# Patient Record
Sex: Male | Born: 1935 | Race: White | Hispanic: No | Marital: Married | Smoking: Former smoker
Health system: Southern US, Community
[De-identification: ages and names within clinical notes are randomized; demographics above are authoritative.]

## PROBLEM LIST (undated history)

## (undated) DIAGNOSIS — E119 Type 2 diabetes mellitus without complications: Secondary | ICD-10-CM

## (undated) DIAGNOSIS — N400 Enlarged prostate without lower urinary tract symptoms: Secondary | ICD-10-CM

## (undated) DIAGNOSIS — I10 Essential (primary) hypertension: Secondary | ICD-10-CM

## (undated) DIAGNOSIS — Z9289 Personal history of other medical treatment: Secondary | ICD-10-CM

## (undated) DIAGNOSIS — C439 Malignant melanoma of skin, unspecified: Secondary | ICD-10-CM

## (undated) DIAGNOSIS — C4491 Basal cell carcinoma of skin, unspecified: Secondary | ICD-10-CM

## (undated) DIAGNOSIS — I480 Paroxysmal atrial fibrillation: Secondary | ICD-10-CM

## (undated) DIAGNOSIS — C61 Malignant neoplasm of prostate: Secondary | ICD-10-CM

## (undated) DIAGNOSIS — E1140 Type 2 diabetes mellitus with diabetic neuropathy, unspecified: Secondary | ICD-10-CM

## (undated) DIAGNOSIS — G629 Polyneuropathy, unspecified: Secondary | ICD-10-CM

## (undated) DIAGNOSIS — I2510 Atherosclerotic heart disease of native coronary artery without angina pectoris: Secondary | ICD-10-CM

## (undated) DIAGNOSIS — IMO0002 Reserved for concepts with insufficient information to code with codable children: Secondary | ICD-10-CM

## (undated) DIAGNOSIS — J189 Pneumonia, unspecified organism: Secondary | ICD-10-CM

## (undated) DIAGNOSIS — I219 Acute myocardial infarction, unspecified: Secondary | ICD-10-CM

## (undated) DIAGNOSIS — Z95810 Presence of automatic (implantable) cardiac defibrillator: Secondary | ICD-10-CM

## (undated) DIAGNOSIS — D649 Anemia, unspecified: Secondary | ICD-10-CM

## (undated) DIAGNOSIS — E785 Hyperlipidemia, unspecified: Secondary | ICD-10-CM

## (undated) DIAGNOSIS — Z992 Dependence on renal dialysis: Secondary | ICD-10-CM

## (undated) DIAGNOSIS — J449 Chronic obstructive pulmonary disease, unspecified: Secondary | ICD-10-CM

## (undated) DIAGNOSIS — G2581 Restless legs syndrome: Secondary | ICD-10-CM

## (undated) DIAGNOSIS — I739 Peripheral vascular disease, unspecified: Secondary | ICD-10-CM

## (undated) DIAGNOSIS — N186 End stage renal disease: Secondary | ICD-10-CM

## (undated) DIAGNOSIS — L0390 Cellulitis, unspecified: Secondary | ICD-10-CM

## (undated) HISTORY — DX: Malignant melanoma of skin, unspecified: C43.9

## (undated) HISTORY — DX: Essential (primary) hypertension: I10

## (undated) HISTORY — PX: TONSILLECTOMY: SUR1361

## (undated) HISTORY — PX: CARDIAC DEFIBRILLATOR PLACEMENT: SHX171

## (undated) HISTORY — DX: Atherosclerotic heart disease of native coronary artery without angina pectoris: I25.10

## (undated) HISTORY — DX: Reserved for concepts with insufficient information to code with codable children: IMO0002

## (undated) HISTORY — DX: Anemia, unspecified: D64.9

## (undated) HISTORY — PX: AV FISTULA PLACEMENT: SHX1204

## (undated) HISTORY — PX: CATARACT EXTRACTION W/ INTRAOCULAR LENS  IMPLANT, BILATERAL: SHX1307

## (undated) HISTORY — DX: Acute myocardial infarction, unspecified: I21.9

## (undated) HISTORY — DX: Restless legs syndrome: G25.81

## (undated) HISTORY — DX: Benign prostatic hyperplasia without lower urinary tract symptoms: N40.0

## (undated) HISTORY — DX: Hyperlipidemia, unspecified: E78.5

## (undated) HISTORY — PX: CORONARY ANGIOPLASTY WITH STENT PLACEMENT: SHX49

## (undated) HISTORY — PX: APPENDECTOMY: SHX54

## (undated) HISTORY — DX: Polyneuropathy, unspecified: G62.9

## (undated) HISTORY — DX: Malignant neoplasm of prostate: C61

---

## 1989-12-06 HISTORY — PX: MELANOMA EXCISION: SHX5266

## 1989-12-06 HISTORY — PX: CORONARY ARTERY BYPASS GRAFT: SHX141

## 1990-12-06 DIAGNOSIS — C439 Malignant melanoma of skin, unspecified: Secondary | ICD-10-CM

## 1990-12-06 DIAGNOSIS — C4491 Basal cell carcinoma of skin, unspecified: Secondary | ICD-10-CM

## 1990-12-06 HISTORY — PX: SKIN CANCER EXCISION: SHX779

## 1990-12-06 HISTORY — DX: Basal cell carcinoma of skin, unspecified: C44.91

## 1990-12-06 HISTORY — DX: Malignant melanoma of skin, unspecified: C43.9

## 2004-12-06 HISTORY — PX: POSTERIOR LUMBAR FUSION: SHX6036

## 2010-11-05 HISTORY — PX: DIALYSIS FISTULA CREATION: SHX611

## 2011-02-26 ENCOUNTER — Ambulatory Visit (INDEPENDENT_AMBULATORY_CARE_PROVIDER_SITE_OTHER): Payer: Medicare Other | Admitting: Vascular Surgery

## 2011-02-26 ENCOUNTER — Encounter (INDEPENDENT_AMBULATORY_CARE_PROVIDER_SITE_OTHER): Payer: Medicare Other

## 2011-02-26 DIAGNOSIS — T82598A Other mechanical complication of other cardiac and vascular devices and implants, initial encounter: Secondary | ICD-10-CM

## 2011-02-26 DIAGNOSIS — N186 End stage renal disease: Secondary | ICD-10-CM
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
## 2011-03-06 NOTE — Procedures (Unsigned)
VASCULAR LAB EXAM  INDICATION:  Nonmaturing left upper extremity fistula.   HISTORY:  EXAM:  Duplex of left AVF shows 3 areas of narrowing and elevated velocities.  The proximal graft is narrowed due to anatomy and failure of vein to dilate, the mid portion of the graft has elevated velocities due to a residual valve cusp, and the distal segment has elevated velocities due to suspected hyperplasia of a residual valve.  IMPRESSION:  Patent left arteriovenous fistula with areas of elevated velocities and narrowing observed.  ___________________________________________ Fransisco Hertz, MD  LT/MEDQ  D:  02/26/2011  T:  02/26/2011  Job:  [id number]

## 2011-03-17 ENCOUNTER — Encounter: Payer: Self-pay | Admitting: Vascular Surgery

## 2011-04-06 HISTORY — PX: AV FISTULA REPAIR: SHX563

## 2011-04-09 ENCOUNTER — Ambulatory Visit (INDEPENDENT_AMBULATORY_CARE_PROVIDER_SITE_OTHER): Payer: Medicare Other | Admitting: Vascular Surgery

## 2011-04-09 ENCOUNTER — Encounter (INDEPENDENT_AMBULATORY_CARE_PROVIDER_SITE_OTHER): Payer: Medicare Other

## 2011-04-09 DIAGNOSIS — N186 End stage renal disease: Secondary | ICD-10-CM

## 2011-04-09 DIAGNOSIS — N184 Chronic kidney disease, stage 4 (severe): Secondary | ICD-10-CM

## 2011-04-09 DIAGNOSIS — Z01810 Encounter for preprocedural cardiovascular examination: Secondary | ICD-10-CM

## 2011-04-12 NOTE — Assessment & Plan Note (Signed)
OFFICE VISIT  [name], [name] DOB:  07/24/1934                                       04/09/2011 ZOXWR#:[id number]  HISTORY OF PRESENT ILLNESS:  The patient is 77 year old patient with end- stage renal disease by report, now chronic kidney disease stage V with need for dialysis soon.  He was supposed to follow up with me with vein mapping which was just done today.  He at this point is not on dialysis. No changes have been noted.  No  increased swelling in the left arm.  He is still able to do activities of daily living on both sides.  PHYSICAL EXAMINATION:  On focused physical examination, he had a blood pressure 174/112, heart rate of 58, respirations 12, sating 96% on room air.  On focused exam, the left hand had 5/5 hand grip.  Sensation was grossly intact on light touch, though he does have a known history of some neurologic loss in both sides.  There was easily palpable thrill in the upper arm.  I looked at it under SonoSite, and it appears compared to the previous vein mapping to be somewhat improved from previous.  I identified at least one area of stenosis near the anastomosis.  He also had a right arm upper extremity vein mapping which demonstrates an acceptable upper arm cephalic vein and basilic vein in the upper arm for use for fistula placement.  MEDICAL DECISION MAKING:  This is a 77 year old patient with chronic kidney disease stage V.  By report it sounds like he will need dialysis soon.  I am going to subsequently go ahead and schedule him for a fistulogram of his left arm and possible venoplasty.  While I think ultimately this left brachiocephalic arteriovenous fistula is fundamentally compromised, I think that the fistulogram will 1) identify the areas of stenosis in his vein and allow possible intervention on these two stenoses and 2) it will interrogate the subclavian vein outflow, the plan basically being to use this left  brachiocephalic arteriovenous fistula as a bridge to a new fistula on the right side.  I have tentatively scheduled this for Thursday.  Pending the outcome of those findings, I will then schedule the right arm fistula, but I think first things first,  we will intervene on the left side.    [name] Hertz, MD Electronically Signed  BLC/MEDQ  D:  04/09/2011  T:  04/12/2011  Job:  [id number]

## 2011-04-15 ENCOUNTER — Ambulatory Visit (HOSPITAL_COMMUNITY)
Admission: RE | Admit: 2011-04-15 | Discharge: 2011-04-15 | Disposition: A | Discharge status: Home / Self Care | Payer: Medicare Other | Source: Ambulatory Visit | Attending: Vascular Surgery | Admitting: Vascular Surgery

## 2011-04-15 DIAGNOSIS — T82898A Other specified complication of vascular prosthetic devices, implants and grafts, initial encounter: Secondary | ICD-10-CM | POA: Insufficient documentation

## 2011-04-15 DIAGNOSIS — Z01810 Encounter for preprocedural cardiovascular examination: Secondary | ICD-10-CM | POA: Insufficient documentation

## 2011-04-15 DIAGNOSIS — N185 Chronic kidney disease, stage 5: Secondary | ICD-10-CM | POA: Insufficient documentation

## 2011-04-15 DIAGNOSIS — I120 Hypertensive chronic kidney disease with stage 5 chronic kidney disease or end stage renal disease: Secondary | ICD-10-CM

## 2011-04-15 DIAGNOSIS — N186 End stage renal disease: Secondary | ICD-10-CM

## 2011-04-15 DIAGNOSIS — Y832 Surgical operation with anastomosis, bypass or graft as the cause of abnormal reaction of the patient, or of later complication, without mention of misadventure at the time of the procedure: Secondary | ICD-10-CM | POA: Insufficient documentation

## 2011-04-15 LAB — POCT I-STAT, CHEM 8
BUN: 57 mg/dL — ABNORMAL HIGH (ref 6–23)
Calcium, Ion: 1.07 mmol/L — ABNORMAL LOW (ref 1.12–1.32)
Chloride: 114 mEq/L — ABNORMAL HIGH (ref 96–112)
Creatinine, Ser: 5.5 mg/dL — ABNORMAL HIGH (ref 0.4–1.5)
Glucose, Bld: 121 mg/dL — ABNORMAL HIGH (ref 70–99)
HCT: 28 % — ABNORMAL LOW (ref 39.0–52.0)
Hemoglobin: 9.5 g/dL — ABNORMAL LOW (ref 13.0–17.0)
Potassium: 4.3 mEq/L (ref 3.5–5.1)
Sodium: 140 mEq/L (ref 135–145)
TCO2: 18 mmol/L (ref 0–100)

## 2011-04-15 LAB — GLUCOSE, CAPILLARY
Glucose-Capillary: 128 mg/dL — ABNORMAL HIGH (ref 70–99)
Glucose-Capillary: 90 mg/dL (ref 70–99)

## 2011-04-16 ENCOUNTER — Encounter: Payer: Self-pay | Admitting: Vascular Surgery

## 2011-04-16 NOTE — Procedures (Unsigned)
CEPHALIC VEIN MAPPING  INDICATION:  Chronic kidney disease.  HISTORY: Left brachiocephalic and AICD.  EXAM: The right cephalic vein is compressible with diameter measurements ranging from 0.53 to 0.19 cm.  The right basilic vein is compressible with diameter measurements ranging from 0.70 to 0.39 cm.  See attached worksheet for all measurements.  IMPRESSION:  Patent right cephalic and basilic vein with diameter measurements as described above.  ___________________________________________ Fransisco Hertz, MD  OD/MEDQ  D:  04/09/2011  T:  04/09/2011  Job:  [id number]

## 2011-04-27 NOTE — Op Note (Signed)
NAMEDIVIT, STIPP              ACCOUNT NO.:  [id number]  MEDICAL RECORD NO.:  [id number]           PATIENT TYPE:  O  LOCATION:  [facility]                         FACILITY:  [facility]  PHYSICIAN:  Fransisco Hertz, MD       DATE OF BIRTH:  09/28/1934  DATE OF PROCEDURE:  04/15/2011 DATE OF DISCHARGE:  04/15/2011                              OPERATIVE REPORT   PROCEDURES: 1. Left brachiocephalic arteriovenous fistula cannulation under     ultrasound guidance. 2. Left arm fistulogram. 3. Left cephalic venoplasty x2, 4 mm x 20 mm and a 60 mm x 30 mm.  PREOPERATIVE DIAGNOSIS:  Left brachiocephalic arteriovenous fistula malfunction.  POSTOPERATIVE DIAGNOSIS:  Left brachiocephalic arteriovenous fistula malfunction.  SURGEON:  Fransisco Hertz, MD  ANESTHESIA:  Local.  ESTIMATED BLOOD LOSS:  Minimal.  CONTRAST:  35 mL.  SPECIMENS:  None.  FINDINGS: 1. A patent left brachiocephalic fistula with stenoses near the     arterial anastomosis, most distal greater than 90%.  The more     proximal one was consistent with sclerotic valve. 2. There is a patent left axillary and subclavian vein and a patent     left innominate vein and left internal jugular vein. 3. There are pacer wires present in the left subclavian vein.  The     left subclavian vein appears to be widely patent still. 4. Resolution of the cephalic vein stenoses after the venoplasty x2.  INDICATIONS:  This is a 77 year old gentleman who is status post a brachiocephalic arteriovenous fistula placed at an outside facility. Unfortunately, they placed it on the same side he had pacer wires partially because the patient was resistant to any intervention, any access placement in his right arm.  In order to facilitate the patient's wishes to avoid access in his right arm, I agreed to go ahead and proceed forward with fistulogram to see if we could salvage this left brachiocephalic arteriovenous fistula though in my opinion eventually  this fistula will be doomed to failure due to the presence of pacer wires in the left subclavian vein.  The patient is aware of the risks of this procedure that include bleeding, infection, possible renal failure due to the contrast dye, possible rupture of any angioplastied vessels, and possible need for additional procedure.  She is aware of these risks and agrees to proceed forward with such.  DESCRIPTION OF THE PROCEDURE:  After full informed written consent was obtained from the patient, he was brought back to the angio suite and placed supine upon the angio table.  He was connected to monitoring equipment and then he was prepped and draped in a standard fashion where a left arm fistulogram under ultrasonic guidance identified the arterial anastomosis at this left brachiocephalic arteriovenous fistula.  I cannulated the fistula near its arterial anastomosis with a micropuncture needle and passed a microwire up into the fistula and then I over the wire placed a micro sheath.  The wire was then removed and the sheath was connected to IV tubing.  The sheath was secured in place with OpSite.  At this point, we performed hand injections  which completed the fistulogram, the findings of which are listed above.  Based on these findings as there was no central venous stenosis, it was my opinion worth attempting to treat these cephalic vein stenoses.  At this point, I took off the OpSite and then I placed a Bentson wire through this sheath and advanced it to the level of the subclavian vein.  The sheath was then exchanged for a short 6-French sheath which was secured in place with OpSite.  I then placed a 4-mm x 20-mm Powerflex balloon at the location of the most distal stenoses.  This was inflated to 15 atmospheres at 1-minute inflation time with no waist whatsoever.  I then removed this and then obtained a 6-mm x 30-mm Powerflex balloon.  This was also inflated at the position of the most  distal stenosis.  This at this point did demonstrate a limited amount of waist which resolved completely with full inflation up to 12 atmospheres at a minute.  I then dropped the balloon and advanced into the level of the what appeared to be a sclerotic valve and inflated this to 10 atmospheres at 30 seconds and then I removed deflated balloon and removed at completion.  Hand injections demonstrated complete resolution of these two stenoses.  At this point, there was no residual stenoses in the venous outflow that I felt would compromise the function of this fistula, so at this point the case was terminated.  I removed the wire and then I took off the OpSite on this securing this sheath.  I placed a 4-0 Monocryl pursestring stitch around this sheath and then while tying down the pursestring stitch, I had the sheath removed.  There was no bleeding from this puncture site.  Sterile dressing was applied at this location.  COMPLICATIONS:  None.  CONDITION:  Stable.  My plan at this point is given the lack of central venous stenosis, I think it is worth trying to use this fistula as his primary access rather than going ahead and proceeding with immediate right arm access placement.  This is also in line with the patient's wishes.     Fransisco Hertz, MD     BLC/MEDQ  D:  04/15/2011  T:  04/16/2011  Job:  [id number]  Electronically Signed by Leonides Sake MD on 04/27/2011 10:53:00 AM

## 2011-04-30 ENCOUNTER — Ambulatory Visit: Payer: Medicare Other | Admitting: Vascular Surgery

## 2011-05-21 ENCOUNTER — Ambulatory Visit (INDEPENDENT_AMBULATORY_CARE_PROVIDER_SITE_OTHER): Payer: Medicare Other | Admitting: Vascular Surgery

## 2011-05-21 ENCOUNTER — Encounter: Payer: Self-pay | Admitting: Vascular Surgery

## 2011-05-21 VITALS — BP 176/95 | HR 69 | Temp 97.8°F

## 2011-05-21 DIAGNOSIS — N186 End stage renal disease: Secondary | ICD-10-CM

## 2011-05-21 DIAGNOSIS — Z992 Dependence on renal dialysis: Secondary | ICD-10-CM

## 2011-05-21 NOTE — Progress Notes (Signed)
[facility] [place]  Established Dialysis Access  History of Present Illness  [name] [name] is a 77 y.o. male who presents for re-evaluation for permanent access.  The patient is right hand dominant.  Previous access procedures have been completed in the left arm.  The patient's complication from previous access procedures include: none.  He most recently underwent a left arm fistulogram with venoplasty x 2 on 04/15/11.   Past Medical History, Past Surgical History, Social History, Family History, Medications, Allergies, and Review of Systems are unchanged from previous visit.  Physical Examination  Filed Vitals:   05/21/11 1305  BP: 176/95  Pulse: 69  Temp: 97.8 F (36.6 C)    General: A&O x 3, WDWN  Pulmonary: Sym exp, good air movt, CTAB, no rales, rhonchi, & wheezing  Cardiac: RRR, Nl S1, S2, no Murmurs, rubs or gallops  Gastrointestinal: soft, NTND, -G/R, - HSM, - masses, - CVAT B  Musculoskeletal: M/S 5/5 throughout, Extremities without  ischemic changes, palpable thrill in left arm, Cephlaic vein >= 6.0 mm throughout  Neurologic: CN 2-12 intact , Pain and light touch intact in extremities, Motor exam as listed above  Medical Decision Making  [name] [name] is a 77 y.o. male who presents with CKD stage V requiring hemodialysis.   Based on sonosite exam, the L BC AVF has matured and is ready for use.  Thank you for giving [place] the opportunity to participate in this patient's care.  Follow up as needed.  Leonides Sake, MD [facility] [place] Office: [phone] Pager: [phone]

## 2011-08-20 ENCOUNTER — Emergency Department (HOSPITAL_COMMUNITY)
Admission: EM | Admit: 2011-08-20 | Discharge: 2011-08-20 | Disposition: A | Discharge status: Home / Self Care | Payer: Medicare Other | Attending: Emergency Medicine | Admitting: Emergency Medicine

## 2011-08-20 ENCOUNTER — Encounter: Payer: Self-pay | Admitting: Internal Medicine

## 2011-08-20 DIAGNOSIS — E119 Type 2 diabetes mellitus without complications: Secondary | ICD-10-CM | POA: Insufficient documentation

## 2011-08-20 DIAGNOSIS — Z794 Long term (current) use of insulin: Secondary | ICD-10-CM | POA: Insufficient documentation

## 2011-08-20 DIAGNOSIS — E039 Hypothyroidism, unspecified: Secondary | ICD-10-CM | POA: Insufficient documentation

## 2011-08-20 DIAGNOSIS — I2510 Atherosclerotic heart disease of native coronary artery without angina pectoris: Secondary | ICD-10-CM | POA: Insufficient documentation

## 2011-08-20 DIAGNOSIS — I10 Essential (primary) hypertension: Secondary | ICD-10-CM | POA: Insufficient documentation

## 2011-08-20 DIAGNOSIS — E785 Hyperlipidemia, unspecified: Secondary | ICD-10-CM | POA: Insufficient documentation

## 2011-08-20 DIAGNOSIS — N19 Unspecified kidney failure: Secondary | ICD-10-CM | POA: Insufficient documentation

## 2011-08-20 DIAGNOSIS — Z7982 Long term (current) use of aspirin: Secondary | ICD-10-CM | POA: Insufficient documentation

## 2011-08-20 DIAGNOSIS — R609 Edema, unspecified: Secondary | ICD-10-CM | POA: Insufficient documentation

## 2011-08-20 DIAGNOSIS — F068 Other specified mental disorders due to known physiological condition: Secondary | ICD-10-CM | POA: Insufficient documentation

## 2011-08-20 DIAGNOSIS — Z79899 Other long term (current) drug therapy: Secondary | ICD-10-CM | POA: Insufficient documentation

## 2011-08-20 LAB — CBC
HCT: 33.9 % — ABNORMAL LOW (ref 39.0–52.0)
Hemoglobin: 10.7 g/dL — ABNORMAL LOW (ref 13.0–17.0)
MCHC: 31.6 g/dL (ref 30.0–36.0)
MCV: 88.7 fL (ref 78.0–100.0)
WBC: 8.2 10*3/uL (ref 4.0–10.5)
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
## 2011-08-21 LAB — BASIC METABOLIC PANEL
BUN: 52 mg/dL — ABNORMAL HIGH (ref 6–23)
Chloride: 98 mEq/L (ref 96–112)
Creatinine, Ser: 5.28 mg/dL — ABNORMAL HIGH (ref 0.50–1.35)
Glucose, Bld: 314 mg/dL — ABNORMAL HIGH (ref 70–99)
Potassium: 2.6 mEq/L — CL (ref 3.5–5.1)

## 2011-09-02 ENCOUNTER — Other Ambulatory Visit: Payer: Self-pay

## 2011-09-02 DIAGNOSIS — N189 Chronic kidney disease, unspecified: Secondary | ICD-10-CM

## 2011-09-02 DIAGNOSIS — Z01810 Encounter for preprocedural cardiovascular examination: Secondary | ICD-10-CM

## 2011-09-09 ENCOUNTER — Inpatient Hospital Stay (HOSPITAL_COMMUNITY): Payer: Medicare Other

## 2011-09-09 ENCOUNTER — Inpatient Hospital Stay (HOSPITAL_COMMUNITY)
Admission: EM | Admit: 2011-09-09 | Discharge: 2011-09-21 | DRG: 673 | Disposition: A | Discharge status: Skilled Nursing Facility | Payer: Medicare Other | Source: Other Acute Inpatient Hospital | Attending: Internal Medicine | Admitting: Internal Medicine

## 2011-09-09 DIAGNOSIS — K219 Gastro-esophageal reflux disease without esophagitis: Secondary | ICD-10-CM | POA: Diagnosis present

## 2011-09-09 DIAGNOSIS — Z794 Long term (current) use of insulin: Secondary | ICD-10-CM

## 2011-09-09 DIAGNOSIS — N179 Acute kidney failure, unspecified: Secondary | ICD-10-CM | POA: Diagnosis present

## 2011-09-09 DIAGNOSIS — J438 Other emphysema: Secondary | ICD-10-CM | POA: Diagnosis present

## 2011-09-09 DIAGNOSIS — E1129 Type 2 diabetes mellitus with other diabetic kidney complication: Secondary | ICD-10-CM | POA: Diagnosis present

## 2011-09-09 DIAGNOSIS — J189 Pneumonia, unspecified organism: Secondary | ICD-10-CM | POA: Diagnosis present

## 2011-09-09 DIAGNOSIS — R339 Retention of urine, unspecified: Secondary | ICD-10-CM | POA: Diagnosis present

## 2011-09-09 DIAGNOSIS — I2510 Atherosclerotic heart disease of native coronary artery without angina pectoris: Secondary | ICD-10-CM | POA: Diagnosis present

## 2011-09-09 DIAGNOSIS — I509 Heart failure, unspecified: Secondary | ICD-10-CM | POA: Diagnosis present

## 2011-09-09 DIAGNOSIS — G2581 Restless legs syndrome: Secondary | ICD-10-CM | POA: Diagnosis present

## 2011-09-09 DIAGNOSIS — R5381 Other malaise: Secondary | ICD-10-CM | POA: Diagnosis present

## 2011-09-09 DIAGNOSIS — E1165 Type 2 diabetes mellitus with hyperglycemia: Secondary | ICD-10-CM | POA: Diagnosis present

## 2011-09-09 DIAGNOSIS — Y832 Surgical operation with anastomosis, bypass or graft as the cause of abnormal reaction of the patient, or of later complication, without mention of misadventure at the time of the procedure: Secondary | ICD-10-CM | POA: Diagnosis present

## 2011-09-09 DIAGNOSIS — E785 Hyperlipidemia, unspecified: Secondary | ICD-10-CM | POA: Diagnosis present

## 2011-09-09 DIAGNOSIS — Z7982 Long term (current) use of aspirin: Secondary | ICD-10-CM

## 2011-09-09 DIAGNOSIS — T82898A Other specified complication of vascular prosthetic devices, implants and grafts, initial encounter: Secondary | ICD-10-CM | POA: Diagnosis present

## 2011-09-09 DIAGNOSIS — N186 End stage renal disease: Secondary | ICD-10-CM | POA: Diagnosis not present

## 2011-09-09 DIAGNOSIS — E875 Hyperkalemia: Secondary | ICD-10-CM | POA: Diagnosis present

## 2011-09-09 DIAGNOSIS — Z951 Presence of aortocoronary bypass graft: Secondary | ICD-10-CM

## 2011-09-09 DIAGNOSIS — N2581 Secondary hyperparathyroidism of renal origin: Secondary | ICD-10-CM | POA: Diagnosis present

## 2011-09-09 DIAGNOSIS — D638 Anemia in other chronic diseases classified elsewhere: Secondary | ICD-10-CM | POA: Diagnosis present

## 2011-09-09 DIAGNOSIS — I5022 Chronic systolic (congestive) heart failure: Secondary | ICD-10-CM | POA: Diagnosis present

## 2011-09-09 DIAGNOSIS — E039 Hypothyroidism, unspecified: Secondary | ICD-10-CM | POA: Diagnosis present

## 2011-09-09 DIAGNOSIS — J9610 Chronic respiratory failure, unspecified whether with hypoxia or hypercapnia: Secondary | ICD-10-CM | POA: Diagnosis present

## 2011-09-09 DIAGNOSIS — I120 Hypertensive chronic kidney disease with stage 5 chronic kidney disease or end stage renal disease: Principal | ICD-10-CM | POA: Diagnosis present

## 2011-09-09 DIAGNOSIS — Z79899 Other long term (current) drug therapy: Secondary | ICD-10-CM

## 2011-09-09 DIAGNOSIS — Z87440 Personal history of urinary (tract) infections: Secondary | ICD-10-CM

## 2011-09-09 DIAGNOSIS — Z7902 Long term (current) use of antithrombotics/antiplatelets: Secondary | ICD-10-CM

## 2011-09-09 DIAGNOSIS — E1142 Type 2 diabetes mellitus with diabetic polyneuropathy: Secondary | ICD-10-CM | POA: Diagnosis present

## 2011-09-09 DIAGNOSIS — E1149 Type 2 diabetes mellitus with other diabetic neurological complication: Secondary | ICD-10-CM | POA: Diagnosis present

## 2011-09-09 LAB — MRSA PCR SCREENING: MRSA by PCR: POSITIVE — AB

## 2011-09-09 LAB — COMPREHENSIVE METABOLIC PANEL
ALT: 16 U/L (ref 0–53)
AST: 14 U/L (ref 0–37)
Albumin: 2.2 g/dL — ABNORMAL LOW (ref 3.5–5.2)
Alkaline Phosphatase: 111 U/L (ref 39–117)
Chloride: 106 mEq/L (ref 96–112)
Potassium: 6.5 mEq/L (ref 3.5–5.1)
Total Bilirubin: 0.2 mg/dL — ABNORMAL LOW (ref 0.3–1.2)

## 2011-09-09 LAB — LIPID PANEL
Cholesterol: 104 mg/dL (ref 0–200)
LDL Cholesterol: 44 mg/dL (ref 0–99)
Triglycerides: 99 mg/dL (ref <150)

## 2011-09-09 LAB — CK TOTAL AND CKMB (NOT AT ARMC)
CK, MB: 5.9 ng/mL — ABNORMAL HIGH (ref 0.3–4.0)
Relative Index: 5.7 — ABNORMAL HIGH (ref 0.0–2.5)

## 2011-09-09 LAB — GLUCOSE, CAPILLARY: Glucose-Capillary: 176 mg/dL — ABNORMAL HIGH (ref 70–99)

## 2011-09-09 LAB — TROPONIN I: Troponin I: <0.3 ng/mL (ref <0.30)

## 2011-09-10 ENCOUNTER — Inpatient Hospital Stay (HOSPITAL_COMMUNITY): Payer: Medicare Other

## 2011-09-10 ENCOUNTER — Encounter (HOSPITAL_COMMUNITY): Payer: Self-pay | Admitting: Radiology

## 2011-09-10 DIAGNOSIS — I517 Cardiomegaly: Secondary | ICD-10-CM

## 2011-09-10 LAB — RENAL FUNCTION PANEL
Albumin: 1.8 g/dL — ABNORMAL LOW (ref 3.5–5.2)
GFR calc Af Amer: 12 mL/min — ABNORMAL LOW (ref >90)
Glucose, Bld: 239 mg/dL — ABNORMAL HIGH (ref 70–99)
Phosphorus: 5.3 mg/dL — ABNORMAL HIGH (ref 2.3–4.6)
Potassium: 5.8 mEq/L — ABNORMAL HIGH (ref 3.5–5.1)
Sodium: 141 mEq/L (ref 135–145)

## 2011-09-10 LAB — CBC
HCT: 28.9 % — ABNORMAL LOW (ref 39.0–52.0)
Hemoglobin: 8.7 g/dL — ABNORMAL LOW (ref 13.0–17.0)
MCH: 28 pg (ref 26.0–34.0)
MCHC: 30.1 g/dL (ref 30.0–36.0)
MCV: 92.9 fL (ref 78.0–100.0)

## 2011-09-10 LAB — GLUCOSE, CAPILLARY
Glucose-Capillary: 122 mg/dL — ABNORMAL HIGH (ref 70–99)
Glucose-Capillary: 193 mg/dL — ABNORMAL HIGH (ref 70–99)
Glucose-Capillary: 247 mg/dL — ABNORMAL HIGH (ref 70–99)

## 2011-09-10 LAB — HEMOGLOBIN A1C: Mean Plasma Glucose: 197 mg/dL — ABNORMAL HIGH (ref <117)

## 2011-09-10 LAB — CK TOTAL AND CKMB (NOT AT ARMC)
CK, MB: 5.5 ng/mL — ABNORMAL HIGH (ref 0.3–4.0)
CK, MB: 5.3 ng/mL — ABNORMAL HIGH (ref 0.3–4.0)

## 2011-09-10 LAB — TSH: TSH: 4.144 u[IU]/mL (ref 0.350–4.500)

## 2011-09-11 LAB — CBC
MCH: 27.6 pg (ref 26.0–34.0)
MCHC: 29.6 g/dL — ABNORMAL LOW (ref 30.0–36.0)
Platelets: 241 10*3/uL (ref 150–400)
RBC: 3.37 MIL/uL — ABNORMAL LOW (ref 4.22–5.81)

## 2011-09-11 LAB — IRON AND TIBC
Saturation Ratios: 30 % (ref 20–55)
TIBC: 183 ug/dL — ABNORMAL LOW (ref 215–435)
UIBC: 128 ug/dL (ref 125–400)

## 2011-09-11 LAB — RENAL FUNCTION PANEL
Albumin: 1.9 g/dL — ABNORMAL LOW (ref 3.5–5.2)
Calcium: 8.3 mg/dL — ABNORMAL LOW (ref 8.4–10.5)
GFR calc Af Amer: 12 mL/min — ABNORMAL LOW (ref >90)
GFR calc non Af Amer: 10 mL/min — ABNORMAL LOW (ref >90)
Phosphorus: 5.5 mg/dL — ABNORMAL HIGH (ref 2.3–4.6)
Sodium: 141 mEq/L (ref 135–145)

## 2011-09-11 LAB — FERRITIN: Ferritin: 288 ng/mL (ref 22–322)

## 2011-09-11 LAB — GLUCOSE, CAPILLARY
Glucose-Capillary: 210 mg/dL — ABNORMAL HIGH (ref 70–99)
Glucose-Capillary: 272 mg/dL — ABNORMAL HIGH (ref 70–99)

## 2011-09-12 LAB — CBC
HCT: 27.5 % — ABNORMAL LOW (ref 39.0–52.0)
MCHC: 30.9 g/dL (ref 30.0–36.0)
Platelets: 225 10*3/uL (ref 150–400)
RDW: 17.2 % — ABNORMAL HIGH (ref 11.5–15.5)

## 2011-09-12 LAB — RENAL FUNCTION PANEL
Calcium: 8 mg/dL — ABNORMAL LOW (ref 8.4–10.5)
GFR calc Af Amer: 12 mL/min — ABNORMAL LOW (ref >90)
Glucose, Bld: 158 mg/dL — ABNORMAL HIGH (ref 70–99)
Phosphorus: 4.8 mg/dL — ABNORMAL HIGH (ref 2.3–4.6)
Sodium: 140 mEq/L (ref 135–145)

## 2011-09-12 LAB — GLUCOSE, CAPILLARY: Glucose-Capillary: 131 mg/dL — ABNORMAL HIGH (ref 70–99)

## 2011-09-13 ENCOUNTER — Inpatient Hospital Stay (HOSPITAL_COMMUNITY): Payer: Medicare Other

## 2011-09-13 DIAGNOSIS — N186 End stage renal disease: Secondary | ICD-10-CM

## 2011-09-13 LAB — GLUCOSE, CAPILLARY

## 2011-09-13 LAB — BASIC METABOLIC PANEL
CO2: 22 mEq/L (ref 19–32)
Calcium: 8.5 mg/dL (ref 8.4–10.5)
GFR calc non Af Amer: 10 mL/min — ABNORMAL LOW (ref >90)
Sodium: 138 mEq/L (ref 135–145)

## 2011-09-13 LAB — CBC
HCT: 32.9 % — ABNORMAL LOW (ref 39.0–52.0)
Hemoglobin: 10.2 g/dL — ABNORMAL LOW (ref 13.0–17.0)
MCH: 28.2 pg (ref 26.0–34.0)
MCHC: 31 g/dL (ref 30.0–36.0)
MCV: 90.9 fL (ref 78.0–100.0)
Platelets: 228 K/uL (ref 150–400)
RBC: 3.62 MIL/uL — ABNORMAL LOW (ref 4.22–5.81)
RDW: 16.9 % — ABNORMAL HIGH (ref 11.5–15.5)
WBC: 7.9 K/uL (ref 4.0–10.5)

## 2011-09-14 DIAGNOSIS — N19 Unspecified kidney failure: Secondary | ICD-10-CM

## 2011-09-14 LAB — CBC
HCT: 29.6 % — ABNORMAL LOW (ref 39.0–52.0)
Hemoglobin: 9.2 g/dL — ABNORMAL LOW (ref 13.0–17.0)
RDW: 17.1 % — ABNORMAL HIGH (ref 11.5–15.5)
WBC: 7.5 10*3/uL (ref 4.0–10.5)

## 2011-09-14 LAB — GLUCOSE, CAPILLARY
Glucose-Capillary: 234 mg/dL — ABNORMAL HIGH (ref 70–99)
Glucose-Capillary: 270 mg/dL — ABNORMAL HIGH (ref 70–99)

## 2011-09-14 LAB — RENAL FUNCTION PANEL
Albumin: 2 g/dL — ABNORMAL LOW (ref 3.5–5.2)
BUN: 57 mg/dL — ABNORMAL HIGH (ref 6–23)
Chloride: 97 mEq/L (ref 96–112)
Glucose, Bld: 262 mg/dL — ABNORMAL HIGH (ref 70–99)
Potassium: 4.4 mEq/L (ref 3.5–5.1)

## 2011-09-15 LAB — RENAL FUNCTION PANEL
Albumin: 1.9 g/dL — ABNORMAL LOW (ref 3.5–5.2)
BUN: 58 mg/dL — ABNORMAL HIGH (ref 6–23)
Calcium: 8.2 mg/dL — ABNORMAL LOW (ref 8.4–10.5)
Creatinine, Ser: 5.54 mg/dL — ABNORMAL HIGH (ref 0.50–1.35)
Phosphorus: 6.4 mg/dL — ABNORMAL HIGH (ref 2.3–4.6)
Potassium: 3.9 mEq/L (ref 3.5–5.1)

## 2011-09-15 LAB — GLUCOSE, CAPILLARY
Glucose-Capillary: 234 mg/dL — ABNORMAL HIGH (ref 70–99)
Glucose-Capillary: 324 mg/dL — ABNORMAL HIGH (ref 70–99)

## 2011-09-15 LAB — CBC
MCV: 90.4 fL (ref 78.0–100.0)
Platelets: 201 10*3/uL (ref 150–400)
RDW: 17.2 % — ABNORMAL HIGH (ref 11.5–15.5)
WBC: 5.9 10*3/uL (ref 4.0–10.5)

## 2011-09-16 LAB — GLUCOSE, CAPILLARY
Glucose-Capillary: 239 mg/dL — ABNORMAL HIGH (ref 70–99)
Glucose-Capillary: 259 mg/dL — ABNORMAL HIGH (ref 70–99)

## 2011-09-16 LAB — BASIC METABOLIC PANEL
BUN: 61 mg/dL — ABNORMAL HIGH (ref 6–23)
Chloride: 100 mEq/L (ref 96–112)
Glucose, Bld: 212 mg/dL — ABNORMAL HIGH (ref 70–99)
Potassium: 3.8 mEq/L (ref 3.5–5.1)

## 2011-09-16 LAB — CBC
HCT: 29.1 % — ABNORMAL LOW (ref 39.0–52.0)
Hemoglobin: 9 g/dL — ABNORMAL LOW (ref 13.0–17.0)
RBC: 3.22 MIL/uL — ABNORMAL LOW (ref 4.22–5.81)
WBC: 7 10*3/uL (ref 4.0–10.5)

## 2011-09-16 NOTE — H&P (Signed)
[name] [name], [name] [name] NO.:  [id number]  MEDICAL RECORD NO.:  [id number]  LOCATION:  2604                         FACILITY:  [facility]  PHYSICIAN:  Rosanna Randy, MDDATE OF BIRTH:  26-Oct-1934  DATE OF ADMISSION:  09/09/2011 DATE OF DISCHARGE:                             HISTORY & PHYSICAL   CHIEF COMPLAINT:  Hyperkalemia, worsening chronic kidney disease with fluid overload and healthcare-associated pneumonia.  HISTORY OF PRESENT ILLNESS:  The patient is a 77 year old male with a past medical history significant for hypertension; chronic respiratory failure, not on oxygen; recurrent UTIs; chronic renal insufficiency stage V; hypothyroidism; coronary artery disease, status post CABG; and diabetes who came into the hospital as transfer from [facility] secondary to worsening of his chronic kidney disease, fluid overload status, and hyperkalemia with also findings of left lower lobe infiltrate/consolidation on an x-ray.  Talking with the patient, he reports that for the last 3-4 days while still at the nursing home where he is residing for physical rehab he was experiencing increased productive cough of a yellowish sputum and just generalized weakness. He was started on Rocephin 1 g intramuscularly as part of the treatment for a possible pneumonia, but since his symptoms a kind of worse and he was transferred to the hospital for further evaluation and treatment. Today while he was in the emergency department at [facility], he was found to have hyperkalemia with a potassium of 6.2, worsening kidney function with a creatinine up to 4.63 with a GFR of 11, fluid overload, and a chest x-ray confirming diagnosis of left lower lobe infiltrate. At that moment in anticipation that the patient might require hemodialysis as part of his treatment for this fluid overload status, [facility] was contacted in order to admit this patient into [facility] where he can receive assistance from the Renal Services and provide hemodialysis if required.  Of note, the patient was also found to be hypoxic with an ABG demonstrated an O2 of 51 and an oxygen saturation on room air of 88%.  The patient was placed on nasal cannula and his oxygen saturation improved significantly.  ALLERGIES:  The patient is allergic to PENICILLINS.  Also allergic to MORPHINE, CELEBREX, and IBUPROFEN, last two due to his end-stage renal dysfunction.  PAST MEDICAL HISTORY: 1. The patient with history of chronic respiratory failure secondary     to emphysema. 2. Recurrent urinary tract infections. 3. History of congestive heart failure.  Unfortunately, no records of     what type or when was the last 2-D echo performed. 4. Chronic renal insufficiency stage V, not on hemodialysis at this     point. 5. Hypothyroidism. 6. Coronary artery disease, status post CABG. 7. Diabetes mellitus type 2. 8. Hypertension. 9. Hyperlipidemia. 10.Diabetic neuropathy. 11.Urinary retention. 12.Physical deconditioning. 13.History of status post A-V fistula to the left forearm.  MEDICATIONS: 1. Avodart 0.5 mg 1 capsule daily. 2. Aspirin 81 mg daily. 3. Novolin sliding scale 2-8 units three times a day before meals. 4. Lantus 30 units daily at bedtime. 5. Bacitracin ointment topical 1 application daily to the outside of     the penis. 6. Zofran 4 mg 1 tablet  every 4 hours as needed for nausea. 7. Ranitidine 150 mg 1 tablet daily. 8. Tessalon Perles 100 mg p.o. dose 1 capsule three times a day for     cough. 9. Synthroid 75 mcg 1 tablet daily at bedtime. 10.Sodium bicarbonate 650 mg 3 tablets three times a day before meals. 11.ReQuip 2 mg 1 tablet daily at bedtime. 12.Protonix 40 mg 1 tablet daily at bedtime. 13.Pravachol 40 mg 1 tablet daily at bedtime. 14.Plavix 75 mg 1 tablet daily. 15.Norvasc 5 mg 1 tablet daily. 16.Multivitamins therapeutic 1 tablet  daily. 17.Lasix 80 mg 1 tablet twice a day. 18.Kayexalate 15 g weekly on Tuesday. 19.Imdur 120 mg 1 tablet daily. 20.Neurontin 300 mg 1 capsule twice a day. 21.Lanthanum 1000 mg 1 tablet three times a day. 22.Flovent 220 mcg inhaled 2 puffs twice a day. 23.Finasteride 5 mg 1 tablet daily, to be started once the supply of     Avodart is exhausted. 24.Ferrous sulfate 325 mg 1 tablet daily. 25.DuoNeb inhaled 1 neb four times a day. 26.Coreg 25 mg 1 tablet twice a day. 27.Advair 500/50 inhaled dose 1 puff twice a day.  SOCIAL HISTORY:  The patient currently living at [facility].  He is a former smoker, quit in 1980.  Denies alcohol and also denies illicit drug use.  The patient is married.  FAMILY HISTORY:  Significant for diabetes and kidney disease.  REVIEW OF SYSTEMS:  Negative except as mentioned on HPI.  PHYSICAL EXAMINATION:  VITAL SIGNS:  Temperature 97.6, heart rate 63, blood pressure 180/89, oxygen saturation 96% on 3 liters. GENERAL:  The patient was lying in bed, in no acute distress. HEENT:  Normocephalic.  No trauma.  Eyes,  PERLA.  Extraocular muscles intact.  No icterus.  No nystagmus.  Mouth with mild dryness of the mucous membrane.  Dentures in place.  No erythema or exudates inside his mouth. NECK:  Supple.  No JVD.  No bruits. RESPIRATORY:  Positive for crackles up to mid lungs.  Rhonchi scattered. Decreased breath sounds on the patient's left lower base. HEART:  Positive systolic murmur, regular rate.  S1 and S2.  No rubs. ABDOMEN:  Soft, obese.  Positive fluid wave.  Left flank with what appears to be a hydrocele mass.  Tender to palpation in this area as well. EXTREMITIES:  Edema 2+ bilaterally all the way up to his mid calf. SKIN:  No open ulcers. GENITOURINARY:  Excoriation in the base of his penis.  Foley in place. NEUROLOGIC:  The patient was alert, awake and oriented x3.  Cranial nerves II through XII grossly intact.   Muscle strength 3-4/5 bilaterally symmetrically.  No specific focal neurologic deficit appreciated. PSYCH:  Appropriate.  LABORATORY DATA:  White blood cells 7.6, hemoglobin 9.8, platelets 262. Arterial blood gas demonstrated a pH of 7.42, pCO2 40, pO2 51, bicarb 25.9.  A CMET demonstrated a sodium of 140, potassium 6.2, chloride 107, bicarb 24, glucose 134, BUN 47, creatinine 4.63, GFR 12, calcium 7.7 with a corrected calcium of 9.1, albumin 2.6.  Urinalysis with a specific gravity of 1.010, few bacteria, and 20-30 white blood cells and red blood cells.  X-ray demonstrated cardiomegaly with mild interstitial edema, consolidation in the left lower lobe and small bilateral effusions.  A CT of the abdomen and pelvis without contrast demonstrated moderately large left pleural effusion and left basilar consolidation versus atelectasis.  No renal enlargement, hydronephrosis, or perinephric stranding were identified on this CT scan of the abdomen.  ASSESSMENT AND PLAN: 1. Healthcare-associated pneumonia.  At this point, we will repeat x-     ray.  The patient has been started on vancomycin and Levaquin.  We     will follow the patient's symptoms, treatment for 10 days. 2. Fluid overload with associated shortness of breath.  At this point     unable to differentiate if this is all coming from fluid overload     secondary to worsening of chronic kidney disease versus some     components of congestive heart failure as well.  We will get a 2-D     echo, check a BMP after discussing with the Renal Team which has     been consulted in the case.  The plan is to diurese the patient     using Lasix 120 mg IV t.i.d. and to follow his renal profile.     Further recommendations based on the patient's improvement and     Renal Team's inputs. 3. Diabetes.  We are going to continue sliding scale insulin sensitive     and also Lantus. 4. Hyperkalemia.  We are going to treat with Kayexalate now with  this     big doses of Lasix.  Potassium will be also corrected.  At this     point, there are no EKG changes on telemetry changes suggesting any     abnormality.  We will not give calcium gluconate at this point. 5. Hypertension.  We are going to continue home medications and we     will use p.r.n. hydralazine. 6. Chronic obstructive pulmonary disease.  We are going to continue     inhalers and also oxygen to maintain oxygen saturation above 90%. 7. Hyperlipidemia.  We will check a fasting lipid profile.  Continue     statins. 8. Hypothyroidism.  We will check TSH.  Continue Synthroid. 9. Gastroesophageal reflux disease.  We are going to continue PPI. 10.Restless legs syndrome.  We will continue ReQuip. 11.Anemia of chronic disease.  We will continue ferrous sulfate. 12.Diabetic neuropathy.  We will continue Neurontin. 13.Coronary artery disease, status post CABG.  We will continue beta-     blockers, Imdur, aspirin, and also Plavix. 14.DVT prophylaxis.  We will use heparin. 15.Physical deconditioning which is chronic.  We will consult physical     therapy and occupational therapy.  Further recommendations and treatment based on the patient's improvement.     Rosanna Randy, MD     CEM/MEDQ  D:  09/09/2011  T:  09/09/2011  Job:  [id number]  cc:   Tommye Standard, MD Garnetta Buddy, M.D.  Electronically Signed by Vassie Loll MD on 09/16/2011 08:20:21 AM

## 2011-09-17 ENCOUNTER — Inpatient Hospital Stay (HOSPITAL_COMMUNITY): Payer: Medicare Other

## 2011-09-17 DIAGNOSIS — N186 End stage renal disease: Secondary | ICD-10-CM

## 2011-09-17 LAB — RENAL FUNCTION PANEL
Albumin: 2 g/dL — ABNORMAL LOW (ref 3.5–5.2)
BUN: 62 mg/dL — ABNORMAL HIGH (ref 6–23)
Chloride: 100 mEq/L (ref 96–112)
Creatinine, Ser: 5.68 mg/dL — ABNORMAL HIGH (ref 0.50–1.35)
Glucose, Bld: 199 mg/dL — ABNORMAL HIGH (ref 70–99)

## 2011-09-17 LAB — CBC
HCT: 29.4 % — ABNORMAL LOW (ref 39.0–52.0)
Hemoglobin: 9.2 g/dL — ABNORMAL LOW (ref 13.0–17.0)
MCHC: 31.3 g/dL (ref 30.0–36.0)
MCV: 90.5 fL (ref 78.0–100.0)
RDW: 17 % — ABNORMAL HIGH (ref 11.5–15.5)
WBC: 7.3 10*3/uL (ref 4.0–10.5)

## 2011-09-17 LAB — GLUCOSE, CAPILLARY
Glucose-Capillary: 193 mg/dL — ABNORMAL HIGH (ref 70–99)
Glucose-Capillary: 174 mg/dL — ABNORMAL HIGH (ref 70–99)
Glucose-Capillary: 209 mg/dL — ABNORMAL HIGH (ref 70–99)
Glucose-Capillary: 250 mg/dL — ABNORMAL HIGH (ref 70–99)

## 2011-09-18 ENCOUNTER — Inpatient Hospital Stay (HOSPITAL_COMMUNITY): Payer: Medicare Other

## 2011-09-18 LAB — RENAL FUNCTION PANEL
Albumin: 2 g/dL — ABNORMAL LOW (ref 3.5–5.2)
Albumin: 1.9 g/dL — ABNORMAL LOW (ref 3.5–5.2)
CO2: 27 mEq/L (ref 19–32)
Calcium: 8.4 mg/dL (ref 8.4–10.5)
Calcium: 8.1 mg/dL — ABNORMAL LOW (ref 8.4–10.5)
Chloride: 98 mEq/L (ref 96–112)
Creatinine, Ser: 4.33 mg/dL — ABNORMAL HIGH (ref 0.50–1.35)
GFR calc Af Amer: 11 mL/min — ABNORMAL LOW (ref >90)
GFR calc Af Amer: 14 mL/min — ABNORMAL LOW (ref >90)
GFR calc non Af Amer: 9 mL/min — ABNORMAL LOW (ref >90)
GFR calc non Af Amer: 12 mL/min — ABNORMAL LOW (ref >90)
Phosphorus: 4.9 mg/dL — ABNORMAL HIGH (ref 2.3–4.6)
Sodium: 137 mEq/L (ref 135–145)
Sodium: 138 mEq/L (ref 135–145)

## 2011-09-18 LAB — CBC
Hemoglobin: 9.2 g/dL — ABNORMAL LOW (ref 13.0–17.0)
MCH: 28.2 pg (ref 26.0–34.0)
MCHC: 30.5 g/dL (ref 30.0–36.0)
MCHC: 30.5 g/dL (ref 30.0–36.0)
MCV: 91.5 fL (ref 78.0–100.0)
MCV: 92.5 fL (ref 78.0–100.0)
Platelets: 223 10*3/uL (ref 150–400)
Platelets: 218 10*3/uL (ref 150–400)
RBC: 3.32 MIL/uL — ABNORMAL LOW (ref 4.22–5.81)
RBC: 3.19 MIL/uL — ABNORMAL LOW (ref 4.22–5.81)
RDW: 17.3 % — ABNORMAL HIGH (ref 11.5–15.5)
RDW: 17.3 % — ABNORMAL HIGH (ref 11.5–15.5)
WBC: 6.3 10*3/uL (ref 4.0–10.5)

## 2011-09-18 LAB — BASIC METABOLIC PANEL
BUN: 38 mg/dL — ABNORMAL HIGH (ref 6–23)
Calcium: 8.3 mg/dL — ABNORMAL LOW (ref 8.4–10.5)
Creatinine, Ser: 3.95 mg/dL — ABNORMAL HIGH (ref 0.50–1.35)
GFR calc Af Amer: 16 mL/min — ABNORMAL LOW (ref >90)
GFR calc non Af Amer: 14 mL/min — ABNORMAL LOW (ref >90)
Potassium: 3.6 mEq/L (ref 3.5–5.1)

## 2011-09-18 LAB — ALT: ALT: 9 U/L (ref 0–53)

## 2011-09-19 LAB — RENAL FUNCTION PANEL
Albumin: 2 g/dL — ABNORMAL LOW (ref 3.5–5.2)
BUN: 28 mg/dL — ABNORMAL HIGH (ref 6–23)
Creatinine, Ser: 3.55 mg/dL — ABNORMAL HIGH (ref 0.50–1.35)
Glucose, Bld: 144 mg/dL — ABNORMAL HIGH (ref 70–99)
Phosphorus: 3.9 mg/dL (ref 2.3–4.6)
Potassium: 3.3 mEq/L — ABNORMAL LOW (ref 3.5–5.1)

## 2011-09-19 LAB — GLUCOSE, CAPILLARY
Glucose-Capillary: 126 mg/dL — ABNORMAL HIGH (ref 70–99)
Glucose-Capillary: 170 mg/dL — ABNORMAL HIGH (ref 70–99)
Glucose-Capillary: 221 mg/dL — ABNORMAL HIGH (ref 70–99)

## 2011-09-19 LAB — CBC
HCT: 28.5 % — ABNORMAL LOW (ref 39.0–52.0)
Hemoglobin: 9 g/dL — ABNORMAL LOW (ref 13.0–17.0)
MCHC: 31.6 g/dL (ref 30.0–36.0)
MCV: 90.8 fL (ref 78.0–100.0)
RDW: 17.2 % — ABNORMAL HIGH (ref 11.5–15.5)

## 2011-09-20 DIAGNOSIS — N186 End stage renal disease: Secondary | ICD-10-CM

## 2011-09-20 LAB — BASIC METABOLIC PANEL
Chloride: 99 mEq/L (ref 96–112)
GFR calc Af Amer: 15 mL/min — ABNORMAL LOW (ref >90)
Potassium: 4 mEq/L (ref 3.5–5.1)
Sodium: 138 mEq/L (ref 135–145)

## 2011-09-20 LAB — CBC
Hemoglobin: 10.2 g/dL — ABNORMAL LOW (ref 13.0–17.0)
MCH: 28.3 pg (ref 26.0–34.0)
MCHC: 30.9 g/dL (ref 30.0–36.0)
Platelets: 241 10*3/uL (ref 150–400)
RBC: 3.6 MIL/uL — ABNORMAL LOW (ref 4.22–5.81)

## 2011-09-20 LAB — GLUCOSE, CAPILLARY
Glucose-Capillary: 193 mg/dL — ABNORMAL HIGH (ref 70–99)
Glucose-Capillary: 181 mg/dL — ABNORMAL HIGH (ref 70–99)
Glucose-Capillary: 181 mg/dL — ABNORMAL HIGH (ref 70–99)

## 2011-09-20 LAB — SURGICAL PCR SCREEN
MRSA, PCR: POSITIVE — AB
Staphylococcus aureus: POSITIVE — AB

## 2011-09-20 NOTE — Consult Note (Signed)
  [name] [name], [name] [name]              ACCOUNT NO.:  [id number]  MEDICAL RECORD NO.:  [id number]  LOCATION:  6736                         FACILITY:  [facility]  PHYSICIAN:  Janetta Hora. Kaylaann Mountz, MD  DATE OF BIRTH:  17-Apr-1934  DATE OF CONSULTATION:  09/13/2011 DATE OF DISCHARGE:                                CONSULTATION   REQUESTING PHYSICIAN:  [name] Pore, MD  REASON FOR CONSULTATION:  Non-maturing AV fistula, left arm.  HISTORY OF PRESENT ILLNESS:  The patient is a 77 year old male admitted with pneumonia and declining renal function.  I was asked to see him for evaluation of his left brachiocephalic AV fistula, which has not matured.  Apparently, the fistula was placed elsewhere, I do not know the exact location for this fistula placement or exactly when it was placed.  However, my partner Dr. Imogene Burn did an intervention on this fistula in May 2012, where the angioplastied two separate segments of the vein.  The vein has failed to mature since then.  The patient is right handed.  He apparently had refused using the right arm earlier because he wanted to avoid his dominant hand.  However, Dr. Imogene Burn had discussed with him previously moving the access to the right side to avoid pacer leads on the left side.  Chronic other medical problems include COPD, hypertension, coronary artery disease, hyperlipidemia, urinary retention.  All these problems are currently stable.  The patient was admitted with pneumonia.  REVIEW OF SYSTEMS:  He has shortness of breath with exertion.  He denies chest pain.  PHYSICAL EXAMINATION:  VITAL SIGNS:  Blood pressure 170/78, temperature 97.8, heart rate 88 and regular, respirations 18, oxygen saturation 94% on 2 L oxygen. CARDIAC:  Regular rate and rhythm. ABDOMEN:  Obese, soft, nontender, and nondistended. EXTREMITIES:  He has a pulsatile left upper arm AV fistula.  He has an IV in the upper arm cephalic vein on the right side.  He has 2+ brachial and  radial pulses bilaterally. CHEST:  Clear to auscultation.  ASSESSMENT:  Non-maturing arteriovenous fistula of left arm despite recent intervention.  I believed at this point, he needs a new access. Since he does have pacer wires on the left side, I believe the right arm access would be a better option.  I discussed with the patient the possibility of a right arm arteriovenous fistula or graft.  He prefers to schedule this is an outpatient, but we could place this while he is here later this week if all of his medical problems were stable.  We will defer this to the Renal Service.  We could potentially place his access on Wednesday or Thursday of this week.  We will obtain vein mapping of his right upper extremity while he is here in the hospital.  We also need to discontinue the IV from his right upper arm cephalic vein.  We will continue to follow.     Janetta Hora. Ervan Heber, MD     CEF/MEDQ  D:  09/13/2011  T:  09/14/2011  Job:  [id number]  Electronically Signed by Fabienne Bruns MD on 09/20/2011 12:54:28 PM

## 2011-09-21 LAB — CBC
HCT: 30.5 % — ABNORMAL LOW (ref 39.0–52.0)
Hemoglobin: 9.4 g/dL — ABNORMAL LOW (ref 13.0–17.0)
MCH: 28.4 pg (ref 26.0–34.0)
MCV: 92.1 fL (ref 78.0–100.0)
RBC: 3.31 MIL/uL — ABNORMAL LOW (ref 4.22–5.81)

## 2011-09-21 LAB — RENAL FUNCTION PANEL
CO2: 27 mEq/L (ref 19–32)
Calcium: 8.8 mg/dL (ref 8.4–10.5)
GFR calc Af Amer: 13 mL/min — ABNORMAL LOW (ref >90)
Glucose, Bld: 182 mg/dL — ABNORMAL HIGH (ref 70–99)
Sodium: 139 mEq/L (ref 135–145)

## 2011-09-26 NOTE — Progress Notes (Signed)
NAME:  [name] [name], [name] [name] NO.:  [id number]  MEDICAL RECORD NO.:  [id number]  LOCATION:  6736                         FACILITY:  [facility]  PHYSICIAN:  Manson Passey, MD        DATE OF BIRTH:  1934/09/03                                PROGRESS NOTE   BRIEF HISTORY OF PRESENT ILLNESS: The patient is a 77 year old male with past medical history of hypertension; chronic respiratory failure; recurrent UTIs; chronic renal insufficiency, stage V; hypothyroidism; coronary artery disease, status post CABG; diabetes who was transferred from [facility] secondary to worsening of chronic kidney disease and fluid overload as well as hyperkalemia.  The patient prior to arrival was in the nursing home and has reported that 3-4 days prior to admission to the hospital, he was experiencing increasing cough productive of yellowish sputum associated with general weakness.  He was initially started on ceftriaxone 1 g intramuscularly for possible pneumonia, but his symptoms have progressively gotten worse and he was transferred to the hospital for further evaluation and treatment.  In emergency room, the patient was evaluated and find out to have hyperkalemia with worsening kidney function as well as fluid overload.  ALLERGIES: 1. PENICILLIN. 2. MORPHINE. 3. CELEBREX. 4. IBUPROFEN.  PAST MEDICAL HISTORY: 1. History of chronic systolic heart failure, ejection fraction 35%     based on 2-D echo on September 10, 2011. 2. History of chronic respiratory failure secondary to emphysema, not     on home oxygen. 3. Chronic kidney disease stage V, not on hemodialysis at present. 4. Hypothyroidism. 5. Coronary artery disease, status post CABG. 6. Diabetes type 2. 7. Hypertension. 8. Dyslipidemia. 9. Recurrent urinary tract infection.  MEDICATIONS WHILE IN HOSPITAL: 1. Albuterol nebulizer every 4 hours. 2. Norvasc 10 mg daily. 3. Aspirin 81 mg daily. 4. Coreg 25 mg b.i.d. 5. Plavix 75  mg daily. 6. Avodart 0.5 mg daily. 7. Pepcid 20 mg daily. 8. Ferrous sulfate 325 mg daily. 9. Finasteride 5 mg daily. 10.Advair one puff twice daily. 11.Lasix 80 mg 3 times a day by mouth. 12.Hydralazine 25 mg every 8 hours p.o. 13.Lantus 20 units at bedtime. 14.Atrovent 4 times a day for shortness of breath. 15.Imdur 120 mg daily. 16.Fosrenol 1000 mg 3 times a day. 17.Levothyroxine 75 mcg daily. 18.Mupirocin x1 twice a day. 19.ReQuip 2 mg at bedtime. 20.Zocor 20 mg at bedtime. 21.Sodium bicarbonate tablets 650 mg 3 times a day. 22.Zofran 4 mg every 6 hours as needed for nausea or vomiting.  PHYSICAL EXAMINATION: VITAL SIGNS:  Blood pressure 142/80, pulse 77, respirations 20, temperature 98.5 degrees Fahrenheit, oxygen saturation 98% on 2 L of nasal cannula. GENERAL APPEARANCE:  No acute distress, the patient appears comfortable. LUNGS:  Clear to auscultation bilaterally, no wheezing. CARDIOVASCULAR:  Positive S1, S2. ABDOMEN:  Positive bowel sounds, soft, nontender/nondistended. EXTREMITIES:  Pulses palpable bilaterally, no cyanosis. SKIN:  Warm, dry. NECK:  Supple, no lymphadenopathy. NEUROLOGIC:  Alert, awake and oriented x3, no focal neurologic deficits.  DIAGNOSTIC STUDIES: 1. On September 13, 2011; chest x-ray shows slight decrease in right     pleural fluid since the previous chest x-ray.  No substantial     change in  the left base collapse/consolidation and small-to-     moderate left pleural effusion. 2. On September 10, 2011; CT of chest without contrast - shows moderate-     to-large left pleural effusion layering dependently with no     loculation.  Collapse/consolidation of the left lower lobe.     Cardiomegaly with interstitial and ground-glass attenuation in the     lungs.  Likely reflecting failure in pulmonary edema.  Loculated     right pleural effusion with loculated fluid in the superior major     fissure.  High right paratracheal mildly enlarged lymph  nodes,     probably congestive. 3. On September 10, 2011; renal ultrasound, which shows small left renal     cyst with no evidence of hydronephrosis. 4. On September 10, 2011; x-ray of the cervical spine, which shows     diffuse degenerative changes with no acute abnormalities.  LABORATORY DATA: On September 16, 2011; sodium 141, potassium 3.8, chloride 100, bicarb 29, BUN 61, creatinine 5.78, glucose 212, calcium 8.2.  White blood cells 7, hemoglobin 9, hematocrit 29, platelet count 209.  Ferritin 288, iron 55, total iron binding capacity 183.  Hemoglobin A1c 8.5.  TSH 4.0144.  The two sets of cardiac enzymes negative.  Total cholesterol 104, triglycerides 99, HDL 40, LDL 44.  HOSPITAL COURSE BY PROBLEM: 1. Acute-on-chronic kidney disease, stage V chronic kidney disease.     The patient needs permanent access for hemodialysis, but he is not     on hemodialysis at present.  Vascular Surgery was consulted and the     plan is for right [facility] AV fistula scheduled for Monday on September 20, 2011.  Before that, the plan is for cath to be placed in the     morning on September 17, 2011.  Renal is following.  Please continue     the same medications that the patient is currently taking for     chronic kidney disease, which includes his sodium bicarbonate     tablets 650 mg 3 times a day.  Continue to monitor BUN and     creatinine. 2. Hypertension, relatively good control on current medications with     Norvasc 10 mg daily, hydralazine 25 mg every 8 hours, and Imdur 240     mg a day. 3. Chronic systolic congestive heart failure with ejection fraction of     35% based on 2-D echocardiogram on September 10, 2011.  Please     continue Coreg 25 mg twice daily, Lasix 80 mg 3 times a day as well     as hydralazine and Imdur. 4. Diabetes.  Blood sugars not controlled to goal as hemoglobin A1c on     admission is 8.5.  Chemistry blood glucose over the last 24 hours     are as following 185, 324, 296.  The  patient is at present on     Lantus 20 units at bedtime, but we will increase it to 26 units at     bedtime and continue sliding scale insulin. 5. Healthcare-associated pneumonia, please continue Levaquin every 48     hours, it is renally adjusted. 6. Anemia likely secondary to chronic kidney disease.  Hemoglobin     stable around 9, please continue to monitor and transfuse if there     is any significant drop. 7. Social work involved for a safe discharge planning as the patient     [facility].          ______________________________ Manson Passey, MD     AD/MEDQ  D:  09/16/2011  T:  09/16/2011  Job:  [id number]  Electronically Signed by Manson Passey MD on 09/26/2011 04:14:24 PM

## 2011-09-27 NOTE — Op Note (Signed)
  [name], [name]              ACCOUNT NO.:  [id number]  MEDICAL RECORD NO.:  [id number]  LOCATION:                                 FACILITY:  PHYSICIAN:  Quita Skye. Hart Rochester, M.D.  DATE OF BIRTH:  Mar 12, 1934  DATE OF PROCEDURE:  09/17/2011 DATE OF DISCHARGE:                              OPERATIVE REPORT   PREOPERATIVE DIAGNOSIS:  End-stage renal disease.  POSTOPERATIVE DIAGNOSIS:  End-stage renal disease.  OPERATION: 1. Bilateral ultrasound localization internal jugular veins. 2. Insertion of Diatek catheter via right internal jugular vein (23     cm).  SURGEON:  Quita Skye. Hart Rochester, MD  FIRST ASSISTANT:  Nurse.  ANESTHESIA:  MAC.  PROCEDURE:  The patient taken the operating room, placed in a Trendelenburg position.  Upper chest and neck were exposed.  Both internal jugular veins were imaged using B-mode ultrasound.  The left seemed to be quite small, right was larger and partially compressible. After prepping and draping in routine sterile manner, the right internal jugular vein was entered using a supraclavicular approach.  Guidewire passed into the right atrium under fluoroscopic guidance.  After dilating the tract appropriately, a 23-cm Diatek catheter was passed through a peel-away sheath, positioned in the right atrium, tunneled peripherally, and secured with nylon sutures.  Wound was closed with Vicryl in a subcuticular fashion.  Sterile dressing applied.  The patient was taken to recovery room in a satisfactory condition.     Quita Skye Hart Rochester, M.D.     JDL/MEDQ  D:  09/17/2011  T:  09/17/2011  Job:  [id number]  Electronically Signed by Josephina Gip M.D. on 09/27/2011 10:08:33 AM

## 2011-09-28 NOTE — Op Note (Signed)
  [name], [name] [name]              ACCOUNT NO.:  [id number]  MEDICAL RECORD NO.:  [id number]  LOCATION:  6736                         FACILITY:  [facility]  PHYSICIAN:  Janetta Hora. Fields, MD  DATE OF BIRTH:  [birth year]  DATE OF PROCEDURE:  09/20/2011 DATE OF DISCHARGE:                              OPERATIVE REPORT   PROCEDURE:  Right brachiocephalic AV fistula.  PREOPERATIVE DIAGNOSIS:  End-stage renal disease.  POSTOPERATIVE DIAGNOSIS:  End-stage renal disease.  ANESTHESIA:  Local with IV sedation.  SURGEON:  Janetta Hora. Fields, MD  ASSISTANT:  Newton Pigg, PA-C.  OPERATIVE FINDINGS: 1. A 3-mm right cephalic vein. 2. A 3.5-mm to 4-mm right brachial artery.  OPERATIVE DETAILS:  After obtaining informed consent, the patient was taken to the operating room.  The patient was placed in supine position on the operating table.  The patient had some difficulty abducting his shoulder so the arm was placed at about a 45-degree abduction rather than usual 90 degrees.  Next, the patient's right upper extremity was prepped and draped in usual fashion.  Local anesthesia was infiltrated near the right antecubital crease.  A transverse incision was made in this location carried down through the subcutaneous tissues down to the level of the cephalic vein.  The cephalic vein was of good quality approximately 3 to 3.5-mm in diameter.  This was dissected free circumferentially and small side branches were ligated and divided between the silk ties.  Next, the brachial artery was dissected free in the medial portion incision.  Vessel loops were placed proximal and distal to the planned site of the arteriotomy.  There was some calcification more proximally in the brachial artery.  The patient was given 5000 units of intravenous heparin.  The distal cephalic vein was ligated with the 2-0 silk tie.  The vein was then transected and swung over the level of the artery.  It was gently distended  with heparinized saline.  There was a hypertrophied valve right at the origin of the vein and this was excised with the Potts scissors.  A longitudinal opening was made in the brachial artery after proximal and distal control were obtained with a fistula clamp and a vessel loop.  The vein was sewn end of vein to side of artery using a running 7-0 Prolene suture.  Just prior to completion of the anastomosis, this was forebled, backbled, and thoroughly flushed.  The anastomosis was secured.  Vessel loops were released.  There was palpable thrill in the fistula immediately.  Next, hemostasis was obtained.  Subcutaneous tissues were reapproximated using running 3-0 Vicryl suture.  The skin was closed with 4-0 Vicryl subcuticular stitch.  Dermabond was applied to the skin.  The patient tolerated the procedure well.  There were no complications.  Instrument, sponge and needle counts were correct at the end of the case.  The patient was taken to the recovery room in stable condition.     Janetta Hora. Fields, MD     CEF/MEDQ  D:  09/20/2011  T:  09/21/2011  Job:  [id number]  Electronically Signed by Fabienne Bruns MD on 09/28/2011 12:32:56 PM

## 2011-10-08 ENCOUNTER — Other Ambulatory Visit: Payer: Medicare Other

## 2011-10-08 ENCOUNTER — Ambulatory Visit: Payer: Medicare Other | Admitting: Vascular Surgery

## 2011-10-15 ENCOUNTER — Encounter: Payer: Self-pay | Admitting: Vascular Surgery

## 2011-11-04 ENCOUNTER — Ambulatory Visit: Payer: Medicare Other

## 2011-11-04 ENCOUNTER — Other Ambulatory Visit: Payer: Medicare Other

## 2011-11-04 ENCOUNTER — Ambulatory Visit: Payer: Medicare Other | Admitting: Vascular Surgery

## 2011-11-18 ENCOUNTER — Other Ambulatory Visit: Payer: Self-pay

## 2011-11-18 DIAGNOSIS — T82898A Other specified complication of vascular prosthetic devices, implants and grafts, initial encounter: Secondary | ICD-10-CM

## 2011-12-08 ENCOUNTER — Encounter: Payer: Self-pay | Admitting: Thoracic Diseases

## 2011-12-09 ENCOUNTER — Encounter: Payer: Self-pay | Admitting: Thoracic Diseases

## 2011-12-09 ENCOUNTER — Other Ambulatory Visit (INDEPENDENT_AMBULATORY_CARE_PROVIDER_SITE_OTHER): Payer: Medicare Other | Admitting: *Deleted

## 2011-12-09 ENCOUNTER — Ambulatory Visit (INDEPENDENT_AMBULATORY_CARE_PROVIDER_SITE_OTHER): Payer: Medicare Other | Admitting: Thoracic Diseases

## 2011-12-09 VITALS — BP 101/56 | HR 73 | Resp 16 | Ht 67.0 in | Wt 144.0 lb

## 2011-12-09 DIAGNOSIS — N186 End stage renal disease: Secondary | ICD-10-CM | POA: Insufficient documentation

## 2011-12-09 DIAGNOSIS — N184 Chronic kidney disease, stage 4 (severe): Secondary | ICD-10-CM

## 2011-12-09 DIAGNOSIS — Z48812 Encounter for surgical aftercare following surgery on the circulatory system: Secondary | ICD-10-CM

## 2011-12-09 NOTE — Progress Notes (Signed)
[facility] [place]  Postoperative Visit hemodialysis access   Date of Surgery: 09/20/2011 Surgeon: CEF HD MWF through right IJ catheter Pacemaker on right  HPI: [name] [name] is a 78 y.o. male who is 10 weeks S/P creation/revision of right upper extremity Hemodialysis access. The patient denies symptoms of numbness, tingling, weakness and denies pain in the operative limb. Patient is here for post -op evaluation to assess healing and maturation of left B-C AVF . Pt had a left upper arm AVF placed which failed to mature despite angioplasty which may have been due to pacemaker wires.  Pt is on hemodialysis through  right IJ catheter on MWF.  Physical Examination  Filed Vitals:   12/09/11 1546  BP: 101/56  Pulse: 73  Resp: 16    WDWN male in NAD.  right upper extremity Incision is healed Skin color is normal   Hand grip is 5/5 and sensation in digits is intact; There is a fair  thrill and fair  bruit in the AVF. The graft/fistula is not easily palpable and of inadequate size Right arm duplex shows one large branch and mid fistula narrowing with velocities of 250-400. It is 5-4mm in size Assessment/Plan Jorgeluis Gurganus is a 78 y.o. year old who is s/p creation/revision of left upper extremity Hemodialysis access.  Will discuss with Dr Darrick Penna whether this fistula would be worth salvaging with an angioplasty and ligation of large branch or if we should abandon this AVF and place a graft.  Clinic MD: Vivi Martens, MD

## 2011-12-17 NOTE — Procedures (Unsigned)
VASCULAR LAB EXAM  INDICATION:  Follow up right arm AV fistula placement.  HISTORY: Diabetes: Cardiac: Hypertension:  EXAM: 1. Duplex of right arm brachiocephalic AV fistula showing an area of     narrowing in the proximal to mid segment of the cephalic vein. 2. A branch measuring 0.9 cm was noted off of the proximal cephalic.  IMPRESSION:  Patent right upper extremity arteriovenous fistula with depth, diameter, velocity, and a branch measurement shown on the following worksheet.  ___________________________________________ Fransisco Hertz, MD  EM/MEDQ  D:  12/10/2011  T:  12/10/2011  Job:  [id number]

## 2011-12-22 ENCOUNTER — Other Ambulatory Visit: Payer: Self-pay

## 2011-12-27 ENCOUNTER — Encounter (HOSPITAL_COMMUNITY): Payer: Self-pay | Admitting: Pharmacy Technician

## 2011-12-29 MED ORDER — SODIUM CHLORIDE 0.9 % IJ SOLN: 3.0000 mL | INTRAMUSCULAR | Status: DC | PRN

## 2011-12-30 ENCOUNTER — Encounter (HOSPITAL_COMMUNITY)
Admission: RE | Disposition: A | Discharge status: Home / Self Care | Payer: Self-pay | Source: Ambulatory Visit | Attending: Vascular Surgery

## 2011-12-30 ENCOUNTER — Ambulatory Visit (HOSPITAL_COMMUNITY)
Admission: RE | Admit: 2011-12-30 | Discharge: 2011-12-30 | Disposition: A | Discharge status: Home / Self Care | Payer: Medicare Other | Source: Ambulatory Visit | Attending: Vascular Surgery | Admitting: Vascular Surgery

## 2011-12-30 DIAGNOSIS — I871 Compression of vein: Secondary | ICD-10-CM | POA: Insufficient documentation

## 2011-12-30 DIAGNOSIS — I252 Old myocardial infarction: Secondary | ICD-10-CM | POA: Insufficient documentation

## 2011-12-30 DIAGNOSIS — G2581 Restless legs syndrome: Secondary | ICD-10-CM | POA: Insufficient documentation

## 2011-12-30 DIAGNOSIS — Z992 Dependence on renal dialysis: Secondary | ICD-10-CM | POA: Insufficient documentation

## 2011-12-30 DIAGNOSIS — I2510 Atherosclerotic heart disease of native coronary artery without angina pectoris: Secondary | ICD-10-CM | POA: Insufficient documentation

## 2011-12-30 DIAGNOSIS — Z8546 Personal history of malignant neoplasm of prostate: Secondary | ICD-10-CM | POA: Insufficient documentation

## 2011-12-30 DIAGNOSIS — Z950 Presence of cardiac pacemaker: Secondary | ICD-10-CM | POA: Insufficient documentation

## 2011-12-30 DIAGNOSIS — I129 Hypertensive chronic kidney disease with stage 1 through stage 4 chronic kidney disease, or unspecified chronic kidney disease: Secondary | ICD-10-CM | POA: Insufficient documentation

## 2011-12-30 DIAGNOSIS — T82598A Other mechanical complication of other cardiac and vascular devices and implants, initial encounter: Secondary | ICD-10-CM | POA: Insufficient documentation

## 2011-12-30 DIAGNOSIS — I509 Heart failure, unspecified: Secondary | ICD-10-CM | POA: Insufficient documentation

## 2011-12-30 DIAGNOSIS — Y832 Surgical operation with anastomosis, bypass or graft as the cause of abnormal reaction of the patient, or of later complication, without mention of misadventure at the time of the procedure: Secondary | ICD-10-CM | POA: Insufficient documentation

## 2011-12-30 DIAGNOSIS — E785 Hyperlipidemia, unspecified: Secondary | ICD-10-CM | POA: Insufficient documentation

## 2011-12-30 DIAGNOSIS — N189 Chronic kidney disease, unspecified: Secondary | ICD-10-CM | POA: Insufficient documentation

## 2011-12-30 DIAGNOSIS — T82898A Other specified complication of vascular prosthetic devices, implants and grafts, initial encounter: Secondary | ICD-10-CM

## 2011-12-30 DIAGNOSIS — E119 Type 2 diabetes mellitus without complications: Secondary | ICD-10-CM | POA: Insufficient documentation

## 2011-12-30 HISTORY — PX: SHUNTOGRAM: SHX5510

## 2011-12-30 LAB — POCT I-STAT, CHEM 8
Calcium, Ion: 1.14 mmol/L (ref 1.12–1.32)
Creatinine, Ser: 2.9 mg/dL — ABNORMAL HIGH (ref 0.50–1.35)
Glucose, Bld: 180 mg/dL — ABNORMAL HIGH (ref 70–99)
HCT: 34 % — ABNORMAL LOW (ref 39.0–52.0)
Hemoglobin: 11.6 g/dL — ABNORMAL LOW (ref 13.0–17.0)
Potassium: 4.2 mEq/L (ref 3.5–5.1)

## 2011-12-30 SURGERY — SHUNTOGRAM
Anesthesia: LOCAL | Laterality: Right

## 2011-12-30 MED ORDER — HEPARIN (PORCINE) IN NACL 2-0.9 UNIT/ML-% IJ SOLN
INTRAMUSCULAR | Status: AC
  Filled 2011-12-30: qty 1000

## 2011-12-30 MED ORDER — LIDOCAINE HCL (PF) 1 % IJ SOLN
INTRAMUSCULAR | Status: AC
  Filled 2011-12-30: qty 30

## 2011-12-30 MED ORDER — HEPARIN SODIUM (PORCINE) 1000 UNIT/ML IJ SOLN
INTRAMUSCULAR | Status: AC
  Filled 2011-12-30: qty 1
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
## 2011-12-30 NOTE — Op Note (Signed)
OPERATIVE NOTE   PROCEDURE: 1. right brachiocephalic arteriovenous fistula cannulation under ultrasound guidance 2. right arm fistulogram 3. Angioplasty of cephalic vein x 3  PRE-OPERATIVE DIAGNOSIS: Malfunctioning right arteriovenous fistula  POST-OPERATIVE DIAGNOSIS: same as above   SURGEON: Leonides Sake, MD  ANESTHESIA: local  ESTIMATED BLOOD LOSS: 5 cc  FINDING(S): 1. >90% stenosis in mid-segment: resolved after angioplasty 2. >75% stenosis in proximal segment: <30% after angioplasty 3. Widely patent upper arm brachial vein, axillary, subclavian, and innominate veins 4. Widely patent superior vena cava   SPECIMEN(S):  None  CONTRAST: 50 cc  INDICATIONS: [name] [name] is a 78 y.o. male who  presents with malfunctioning right brachiocephalic arteriovenous fistula.  The patient is scheduled for right arm fistuloogram.  I discussed with the patient the nature of angiographic procedures, especially the limited patencies of any endovascular intervention.  The patient is aware of that the risks of an angiographic procedure include but are not limited to: bleeding, infection, access site complications, renal failure, embolization, rupture of vessel, dissection, possible need for emergent surgical intervention, possible need for surgical procedures to treat the patient's pathology, and stroke and death.  The patient is aware of the risks and agrees to proceed.  DESCRIPTION: After full informed written consent was obtained, the patient was brought back to the angiography suite and placed supine upon the angiography table.  The patient was connected to monitoring equipment.  The right arm was prepped and draped in the standard fashion for a right arm fistulogram.  Under ultrasound guidance, the right brachiocephalic arteriovenous fistula was cannulated with a micropuncture needle.  The microwire was advanced into the fistula and the needle was exchanged for the a microsheath, which was lodged  2 cm into the access.  The wire was removed and the sheath was connected to the IV extension tubing.  Hand injections were completed to image the access from the antecubitum up to the level of axilla.  The central venous structures were also imaged by hand injections.  Based on the images, this patient will need: angioplasty of the cephalic vein.  A Benson wire was passed into the proximal vein and the sheath was exchanged for a short 6-Fr sheath.  A 4 mm x 40 mm angioplasty balloon was inflated to 14 atm and then deflated after 1 minutes.  The balloon was then exchanged for a 6 mm x 60 mm angioplasty balloon which inflated twice, once at 12 atm for 1 minute and once at 14 atm for one minute.  Hand injections demonstrated resolution of the mid-segment stenosis.  Finally I angioplastied the proximal stenosis with the 6 mm x 60 mm angioplasty balloon at 12 atm for 1 minute.  Hand injection demonstrates < 30% residual stenosis.  The patient was complains of severe pain at this time.  I elected not to reinflate the balloon at this point to avoid rupture of this proximal segment of the cephalic vein, especially as it would be difficult to compress if frank rupture occurred.  I pulled out the wire and balloon.  A 4-0 Monocryl purse-string suture was sewn around the sheath.  The sheath was removed while tying down the suture.  A sterile bandage was applied to the puncture site.  COMPLICATIONS: none  CONDITION: stable  Leonides Sake, MD [facility] [place] Office: [phone] Pager: [phone]  12/30/2011 9:33 AM

## 2011-12-30 NOTE — H&P (View-Only) (Signed)
[facility] [place]  Postoperative Visit hemodialysis access   Date of Surgery: 09/20/2011 Surgeon: CEF HD MWF through right IJ catheter Pacemaker on right  HPI: [name] [name] is a 78 y.o. male who is 10 weeks S/P creation/revision of right upper extremity Hemodialysis access. The patient denies symptoms of numbness, tingling, weakness and denies pain in the operative limb. Patient is here for post -op evaluation to assess healing and maturation of left B-C AVF . Pt had a left upper arm AVF placed which failed to mature despite angioplasty which may have been due to pacemaker wires.  Pt is on hemodialysis through  right IJ catheter on MWF.  Physical Examination  Filed Vitals:   12/09/11 1546  BP: 101/56  Pulse: 73  Resp: 16    WDWN male in NAD.  right upper extremity Incision is healed Skin color is normal   Hand grip is 5/5 and sensation in digits is intact; There is a fair  thrill and fair  bruit in the AVF. The graft/fistula is not easily palpable and of inadequate size Right arm duplex shows one large branch and mid fistula narrowing with velocities of 250-400. It is 5-7mm in size Assessment/Plan [name] [name] is a 78 y.o. year old who is s/p creation/revision of left upper extremity Hemodialysis access.  Will discuss with Dr Fields whether this fistula would be worth salvaging with an angioplasty and ligation of large branch or if we should abandon this AVF and place a graft.  Clinic MD: VW Brabham, MD    

## 2011-12-30 NOTE — Interval H&P Note (Signed)
--    [facility] [place]  History and Physical Update  The patient was interviewed and re-examined.  The patient's History and Physical has been reviewed and is unchanged.  There is no change in the plan of care.  Leonides Sake, MD [facility] [place] Office: [phone] Pager: [phone]  12/30/2011, 8:09 AM

## 2012-02-04 ENCOUNTER — Ambulatory Visit: Payer: Medicare Other | Admitting: Vascular Surgery

## 2012-02-08 ENCOUNTER — Other Ambulatory Visit: Payer: Self-pay

## 2012-02-08 DIAGNOSIS — T82898A Other specified complication of vascular prosthetic devices, implants and grafts, initial encounter: Secondary | ICD-10-CM

## 2012-02-16 ENCOUNTER — Ambulatory Visit: Payer: Medicare Other

## 2012-02-17 ENCOUNTER — Ambulatory Visit: Payer: Medicare Other

## 2012-03-01 ENCOUNTER — Encounter: Payer: Self-pay | Admitting: Neurosurgery

## 2012-03-02 ENCOUNTER — Encounter: Payer: Self-pay | Admitting: Physician Assistant

## 2012-03-02 ENCOUNTER — Ambulatory Visit (INDEPENDENT_AMBULATORY_CARE_PROVIDER_SITE_OTHER): Payer: PRIVATE HEALTH INSURANCE | Admitting: Physician Assistant

## 2012-03-02 ENCOUNTER — Encounter (INDEPENDENT_AMBULATORY_CARE_PROVIDER_SITE_OTHER): Payer: PRIVATE HEALTH INSURANCE | Admitting: *Deleted

## 2012-03-02 VITALS — BP 105/66 | HR 70 | Resp 18 | Ht 67.0 in | Wt 169.5 lb

## 2012-03-02 DIAGNOSIS — N184 Chronic kidney disease, stage 4 (severe): Secondary | ICD-10-CM

## 2012-03-02 DIAGNOSIS — Z01810 Encounter for preprocedural cardiovascular examination: Secondary | ICD-10-CM

## 2012-03-02 DIAGNOSIS — N186 End stage renal disease: Secondary | ICD-10-CM

## 2012-03-02 DIAGNOSIS — T82898A Other specified complication of vascular prosthetic devices, implants and grafts, initial encounter: Secondary | ICD-10-CM

## 2012-03-02 NOTE — Progress Notes (Signed)
[facility] [place] Postoperative Visit hemodialysis access    HD:  yes HD:  Days:  M/W/F   CC: low flow for AVF  HPI:  This is a 78 y.o. male who has had a right brachiocephalic AVF in the past.  Most recently, he underwent a cephalic vein angioplasty x 3 on 12/30/11 by Dr. Imogene Burn.  They still have not had good access with this fistula despite the angioplasty.  He returns today for evaluation.  He denies any steal symptoms.  PHYSICAL EXAMINATION:  Filed Vitals:   03/02/12 1428  BP: 105/66  Pulse: 70  Resp: 18     Incision is well healed Hand grip is equal bilaterally and sensation in digits is intact; There is  Thrill, which gets louder the more proximal you go; there is bruit. The graft/fistula is easily palpable  Pulse:  +palpable radial pulse.  Duplex of fistula 03/02/12 location Diameter (cm) Depth (cm)  prx brachium 0.55 0.66  Mid brachium 0.56 0.65  Dis brhachium 0.67 0.38    ASSESSMENT/PLAN:  [name] [name] is a 78 y.o. year old male who presents to be evaluated for poorly functioning fistula. -the pt states that he does not want any further surgeries.  Dr. Darrick Penna agreed with pt that he doesn't think another angioplasty would work.  The pt's options are to revise and superficialize the right brachiocephalic AVF or a new AVG. -the pt wishes to think about this and will contact us when he is ready to proceed.  Newton Pigg, PA-C [facility] [phone]  Clinic MD:   Darrick Penna.

## 2012-03-10 NOTE — Procedures (Unsigned)
VASCULAR LAB EXAM  INDICATION:  Evaluate poorly functioning AVF.  HISTORY: Diabetes: Cardiac: Hypertension:  EXAM:  Patent to right brachiocephalic AV fistula with a minimal amount of vessel tortuosity noted near but not involving the anastomosis.  There is also a branch noted proximally.  IMPRESSION:  Patent to right radiocephalic AV fistula with depth, diameter, velocity and branch measurements all shown on the worksheet.  ___________________________________________ Fransisco Hertz, MD  EM/MEDQ  D:  03/07/2012  T:  03/07/2012  Job:  [id number]

## 2012-03-22 ENCOUNTER — Other Ambulatory Visit: Payer: Self-pay

## 2012-03-22 ENCOUNTER — Inpatient Hospital Stay: Admit: 2012-03-22 | Payer: Self-pay | Admitting: Orthopedic Surgery

## 2012-03-22 DIAGNOSIS — Z01810 Encounter for preprocedural cardiovascular examination: Secondary | ICD-10-CM

## 2012-03-22 DIAGNOSIS — N186 End stage renal disease: Secondary | ICD-10-CM

## 2012-03-22 SURGERY — FASCIOTOMY, UPPER EXTREMITY
Anesthesia: General

## 2012-03-30 ENCOUNTER — Encounter (HOSPITAL_COMMUNITY): Payer: Self-pay | Admitting: Internal Medicine

## 2012-03-30 ENCOUNTER — Inpatient Hospital Stay (HOSPITAL_COMMUNITY)
Admission: AD | Admit: 2012-03-30 | Discharge: 2012-04-10 | DRG: 239 | Disposition: A | Discharge status: Skilled Nursing Facility | Payer: PRIVATE HEALTH INSURANCE | Source: Other Acute Inpatient Hospital | Attending: Internal Medicine | Admitting: Internal Medicine

## 2012-03-30 DIAGNOSIS — E876 Hypokalemia: Secondary | ICD-10-CM | POA: Diagnosis present

## 2012-03-30 DIAGNOSIS — Z992 Dependence on renal dialysis: Secondary | ICD-10-CM | POA: Diagnosis present

## 2012-03-30 DIAGNOSIS — I959 Hypotension, unspecified: Secondary | ICD-10-CM | POA: Diagnosis not present

## 2012-03-30 DIAGNOSIS — Z87891 Personal history of nicotine dependence: Secondary | ICD-10-CM

## 2012-03-30 DIAGNOSIS — R339 Retention of urine, unspecified: Secondary | ICD-10-CM | POA: Diagnosis present

## 2012-03-30 DIAGNOSIS — N186 End stage renal disease: Secondary | ICD-10-CM | POA: Diagnosis present

## 2012-03-30 DIAGNOSIS — Z85820 Personal history of malignant melanoma of skin: Secondary | ICD-10-CM

## 2012-03-30 DIAGNOSIS — N400 Enlarged prostate without lower urinary tract symptoms: Secondary | ICD-10-CM | POA: Diagnosis present

## 2012-03-30 DIAGNOSIS — I10 Essential (primary) hypertension: Secondary | ICD-10-CM | POA: Diagnosis present

## 2012-03-30 DIAGNOSIS — I70269 Atherosclerosis of native arteries of extremities with gangrene, unspecified extremity: Secondary | ICD-10-CM | POA: Diagnosis present

## 2012-03-30 DIAGNOSIS — I252 Old myocardial infarction: Secondary | ICD-10-CM

## 2012-03-30 DIAGNOSIS — L02419 Cutaneous abscess of limb, unspecified: Secondary | ICD-10-CM | POA: Diagnosis present

## 2012-03-30 DIAGNOSIS — J449 Chronic obstructive pulmonary disease, unspecified: Secondary | ICD-10-CM | POA: Diagnosis present

## 2012-03-30 DIAGNOSIS — E1142 Type 2 diabetes mellitus with diabetic polyneuropathy: Secondary | ICD-10-CM | POA: Diagnosis present

## 2012-03-30 DIAGNOSIS — Z888 Allergy status to other drugs, medicaments and biological substances status: Secondary | ICD-10-CM

## 2012-03-30 DIAGNOSIS — Z7982 Long term (current) use of aspirin: Secondary | ICD-10-CM

## 2012-03-30 DIAGNOSIS — Z79899 Other long term (current) drug therapy: Secondary | ICD-10-CM

## 2012-03-30 DIAGNOSIS — Z7902 Long term (current) use of antithrombotics/antiplatelets: Secondary | ICD-10-CM

## 2012-03-30 DIAGNOSIS — J4489 Other specified chronic obstructive pulmonary disease: Secondary | ICD-10-CM | POA: Diagnosis present

## 2012-03-30 DIAGNOSIS — I120 Hypertensive chronic kidney disease with stage 5 chronic kidney disease or end stage renal disease: Secondary | ICD-10-CM | POA: Diagnosis present

## 2012-03-30 DIAGNOSIS — N2581 Secondary hyperparathyroidism of renal origin: Secondary | ICD-10-CM | POA: Diagnosis present

## 2012-03-30 DIAGNOSIS — E13621 Other specified diabetes mellitus with foot ulcer: Secondary | ICD-10-CM

## 2012-03-30 DIAGNOSIS — D649 Anemia, unspecified: Secondary | ICD-10-CM | POA: Diagnosis present

## 2012-03-30 DIAGNOSIS — L97509 Non-pressure chronic ulcer of other part of unspecified foot with unspecified severity: Secondary | ICD-10-CM | POA: Diagnosis present

## 2012-03-30 DIAGNOSIS — I509 Heart failure, unspecified: Secondary | ICD-10-CM | POA: Diagnosis present

## 2012-03-30 DIAGNOSIS — Z880 Allergy status to penicillin: Secondary | ICD-10-CM

## 2012-03-30 DIAGNOSIS — E119 Type 2 diabetes mellitus without complications: Secondary | ICD-10-CM | POA: Diagnosis present

## 2012-03-30 DIAGNOSIS — L98499 Non-pressure chronic ulcer of skin of other sites with unspecified severity: Secondary | ICD-10-CM | POA: Diagnosis present

## 2012-03-30 DIAGNOSIS — E785 Hyperlipidemia, unspecified: Secondary | ICD-10-CM | POA: Diagnosis present

## 2012-03-30 DIAGNOSIS — I7092 Chronic total occlusion of artery of the extremities: Secondary | ICD-10-CM | POA: Diagnosis present

## 2012-03-30 DIAGNOSIS — E1159 Type 2 diabetes mellitus with other circulatory complications: Principal | ICD-10-CM | POA: Diagnosis present

## 2012-03-30 DIAGNOSIS — Z794 Long term (current) use of insulin: Secondary | ICD-10-CM

## 2012-03-30 DIAGNOSIS — L0390 Cellulitis, unspecified: Secondary | ICD-10-CM

## 2012-03-30 DIAGNOSIS — G2581 Restless legs syndrome: Secondary | ICD-10-CM | POA: Diagnosis present

## 2012-03-30 DIAGNOSIS — E1149 Type 2 diabetes mellitus with other diabetic neurological complication: Secondary | ICD-10-CM | POA: Diagnosis present

## 2012-03-30 DIAGNOSIS — Z8546 Personal history of malignant neoplasm of prostate: Secondary | ICD-10-CM

## 2012-03-30 DIAGNOSIS — Z95810 Presence of automatic (implantable) cardiac defibrillator: Secondary | ICD-10-CM

## 2012-03-30 HISTORY — DX: Cellulitis, unspecified: L03.90

## 2012-03-30 HISTORY — DX: Type 2 diabetes mellitus without complications: E11.9

## 2012-03-30 HISTORY — DX: Chronic obstructive pulmonary disease, unspecified: J44.9

## 2012-03-30 HISTORY — DX: Peripheral vascular disease, unspecified: I73.9

## 2012-03-30 HISTORY — DX: Pneumonia, unspecified organism: J18.9

## 2012-03-30 HISTORY — DX: Basal cell carcinoma of skin, unspecified: C44.91

## 2012-03-30 HISTORY — DX: Essential (primary) hypertension: I10

## 2012-03-30 HISTORY — DX: Type 2 diabetes mellitus with diabetic neuropathy, unspecified: E11.40

## 2012-03-30 LAB — CBC
MCH: 29.1 pg (ref 26.0–34.0)
MCV: 95.8 fL (ref 78.0–100.0)
Platelets: 277 10*3/uL (ref 150–400)
RDW: 16 % — ABNORMAL HIGH (ref 11.5–15.5)
WBC: 8.1 10*3/uL (ref 4.0–10.5)

## 2012-03-30 LAB — CREATININE, SERUM: Creatinine, Ser: 4.44 mg/dL — ABNORMAL HIGH (ref 0.50–1.35)

## 2012-03-30 MED ORDER — BENZONATATE 100 MG PO CAPS
100.0000 mg | ORAL_CAPSULE | Freq: Three times a day (TID) | ORAL | Status: DC | PRN
  Filled 2012-03-30: qty 1

## 2012-03-30 MED ORDER — LANTHANUM CARBONATE 500 MG PO CHEW
1000.0000 mg | CHEWABLE_TABLET | Freq: Three times a day (TID) | ORAL | Status: DC
  Administered 2012-03-31 – 2012-04-02 (×6): 1000 mg via ORAL
  Filled 2012-03-30 (×13): qty 2

## 2012-03-30 MED ORDER — INSULIN REGULAR HUMAN 100 UNIT/ML IJ SOLN: 5.0000 [IU] | Freq: Three times a day (TID) | INTRAMUSCULAR | Status: DC

## 2012-03-30 MED ORDER — AMLODIPINE BESYLATE 5 MG PO TABS
5.0000 mg | ORAL_TABLET | Freq: Every day | ORAL | Status: DC
  Administered 2012-04-01 – 2012-04-02 (×2): 5 mg via ORAL
  Filled 2012-03-30 (×4): qty 1

## 2012-03-30 MED ORDER — INSULIN ASPART 100 UNIT/ML ~~LOC~~ SOLN
5.0000 [IU] | Freq: Three times a day (TID) | SUBCUTANEOUS | Status: DC
  Administered 2012-03-31 (×2): 5 [IU] via SUBCUTANEOUS

## 2012-03-30 MED ORDER — ACETAMINOPHEN 325 MG PO TABS: 650.0000 mg | ORAL_TABLET | Freq: Four times a day (QID) | ORAL | Status: DC | PRN

## 2012-03-30 MED ORDER — ONDANSETRON HCL 4 MG PO TABS
4.0000 mg | ORAL_TABLET | Freq: Three times a day (TID) | ORAL | Status: DC | PRN
  Administered 2012-04-01: 4 mg via ORAL
  Filled 2012-03-30: qty 1

## 2012-03-30 MED ORDER — ENOXAPARIN SODIUM 30 MG/0.3ML ~~LOC~~ SOLN
30.0000 mg | SUBCUTANEOUS | Status: DC
  Administered 2012-04-01 – 2012-04-02 (×3): 30 mg via SUBCUTANEOUS
  Filled 2012-03-30 (×4): qty 0.3

## 2012-03-30 MED ORDER — ASPIRIN 81 MG PO TABS: 81.0000 mg | ORAL_TABLET | Freq: Every day | ORAL | Status: DC

## 2012-03-30 MED ORDER — CARVEDILOL 25 MG PO TABS
25.0000 mg | ORAL_TABLET | Freq: Two times a day (BID) | ORAL | Status: DC
  Administered 2012-04-01 – 2012-04-02 (×4): 25 mg via ORAL
  Filled 2012-03-30 (×9): qty 1

## 2012-03-30 MED ORDER — GABAPENTIN 300 MG PO CAPS
300.0000 mg | ORAL_CAPSULE | Freq: Two times a day (BID) | ORAL | Status: DC
  Administered 2012-03-31 – 2012-04-02 (×6): 300 mg via ORAL
  Filled 2012-03-30 (×8): qty 1

## 2012-03-30 MED ORDER — FAMOTIDINE 10 MG PO TABS
10.0000 mg | ORAL_TABLET | Freq: Two times a day (BID) | ORAL | Status: DC
  Administered 2012-03-31 – 2012-04-02 (×5): 10 mg via ORAL
  Filled 2012-03-30 (×8): qty 1

## 2012-03-30 MED ORDER — OLMESARTAN 10 MG HALF TABLET
10.0000 mg | ORAL_TABLET | Freq: Every day | ORAL | Status: DC
  Administered 2012-04-01 – 2012-04-02 (×2): 10 mg via ORAL
  Filled 2012-03-30 (×4): qty 1

## 2012-03-30 MED ORDER — PANTOPRAZOLE SODIUM 40 MG PO TBEC: 40.0000 mg | DELAYED_RELEASE_TABLET | Freq: Every day | ORAL | Status: DC

## 2012-03-30 MED ORDER — CLOPIDOGREL BISULFATE 75 MG PO TABS
75.0000 mg | ORAL_TABLET | Freq: Every day | ORAL | Status: DC
  Administered 2012-04-01 – 2012-04-02 (×3): 75 mg via ORAL
  Filled 2012-03-30 (×4): qty 1

## 2012-03-30 MED ORDER — CALCIUM CARBONATE 1250 MG/5ML PO SUSP
500.0000 mg | Freq: Four times a day (QID) | ORAL | Status: DC | PRN
Start: 2012-03-30 — End: 2012-04-03

## 2012-03-30 MED ORDER — SIMVASTATIN 5 MG PO TABS
5.0000 mg | ORAL_TABLET | Freq: Every day | ORAL | Status: DC
  Administered 2012-04-01 – 2012-04-02 (×3): 5 mg via ORAL
  Filled 2012-03-30 (×4): qty 1

## 2012-03-30 MED ORDER — ROPINIROLE HCL 1 MG PO TABS: 2.0000 mg | ORAL_TABLET | Freq: Every day | ORAL | Status: DC

## 2012-03-30 MED ORDER — SODIUM CHLORIDE 0.9 % IV SOLN: 500.0000 mg | Freq: Two times a day (BID) | INTRAVENOUS | Status: DC

## 2012-03-30 MED ORDER — HYDROCODONE-ACETAMINOPHEN 5-325 MG PO TABS
1.0000 | ORAL_TABLET | ORAL | Status: DC | PRN
  Administered 2012-03-31 – 2012-04-02 (×5): 1 via ORAL
  Filled 2012-03-30 (×8): qty 1

## 2012-03-30 MED ORDER — ROPINIROLE HCL 1 MG PO TABS
2.0000 mg | ORAL_TABLET | Freq: Every day | ORAL | Status: DC
  Administered 2012-04-01 – 2012-04-02 (×3): 2 mg via ORAL
  Filled 2012-03-30 (×4): qty 2

## 2012-03-30 MED ORDER — ISOSORBIDE MONONITRATE ER 60 MG PO TB24
120.0000 mg | ORAL_TABLET | Freq: Every day | ORAL | Status: DC
  Administered 2012-04-01 – 2012-04-02 (×2): 120 mg via ORAL
  Filled 2012-03-30 (×4): qty 2

## 2012-03-30 MED ORDER — SODIUM CHLORIDE 0.9 % IJ SOLN
3.0000 mL | INTRAMUSCULAR | Status: DC | PRN
  Administered 2012-03-31: 3 mL via INTRAVENOUS

## 2012-03-30 MED ORDER — INSULIN GLARGINE 100 UNIT/ML ~~LOC~~ SOLN: 30.0000 [IU] | Freq: Every day | SUBCUTANEOUS | Status: DC

## 2012-03-30 MED ORDER — DUTASTERIDE 0.5 MG PO CAPS
0.5000 mg | ORAL_CAPSULE | Freq: Every day | ORAL | Status: DC
  Administered 2012-04-01 – 2012-04-02 (×3): 0.5 mg via ORAL
  Filled 2012-03-30 (×4): qty 1

## 2012-03-30 MED ORDER — CALCIUM ACETATE 667 MG PO CAPS
1334.0000 mg | ORAL_CAPSULE | Freq: Three times a day (TID) | ORAL | Status: DC
  Administered 2012-03-31 – 2012-04-02 (×6): 1334 mg via ORAL
  Filled 2012-03-30 (×13): qty 2

## 2012-03-30 MED ORDER — PANTOPRAZOLE SODIUM 40 MG PO TBEC
40.0000 mg | DELAYED_RELEASE_TABLET | Freq: Every day | ORAL | Status: DC
  Administered 2012-04-01 – 2012-04-02 (×2): 40 mg via ORAL
  Filled 2012-03-30: qty 1

## 2012-03-30 MED ORDER — VANCOMYCIN HCL 1000 MG IV SOLR
750.0000 mg | Freq: Once | INTRAVENOUS | Status: AC
  Administered 2012-03-31: 750 mg via INTRAVENOUS
  Filled 2012-03-30: qty 750

## 2012-03-30 MED ORDER — DARBEPOETIN ALFA-POLYSORBATE 100 MCG/0.5ML IJ SOLN: 100.0000 ug | INTRAMUSCULAR | Status: DC

## 2012-03-30 MED ORDER — SODIUM CHLORIDE 0.9 % IV SOLN
250.0000 mg | Freq: Two times a day (BID) | INTRAVENOUS | Status: DC
  Administered 2012-03-31 – 2012-04-03 (×7): 250 mg via INTRAVENOUS
  Filled 2012-03-30 (×9): qty 250

## 2012-03-30 MED ORDER — FLUTICASONE PROPIONATE HFA 110 MCG/ACT IN AERO
1.0000 | INHALATION_SPRAY | Freq: Two times a day (BID) | RESPIRATORY_TRACT | Status: DC
  Filled 2012-03-30 (×2): qty 12

## 2012-03-30 MED ORDER — ACETAMINOPHEN 650 MG RE SUPP: 650.0000 mg | Freq: Four times a day (QID) | RECTAL | Status: DC | PRN

## 2012-03-30 MED ORDER — FERROUS SULFATE 325 (65 FE) MG PO TABS
325.0000 mg | ORAL_TABLET | Freq: Every day | ORAL | Status: DC
  Administered 2012-04-02: 325 mg via ORAL
  Filled 2012-03-30 (×5): qty 1

## 2012-03-30 MED ORDER — ZOLPIDEM TARTRATE 5 MG PO TABS: 5.0000 mg | ORAL_TABLET | Freq: Every evening | ORAL | Status: DC | PRN

## 2012-03-30 MED ORDER — SODIUM CHLORIDE 0.9 % IV SOLN: 250.0000 mL | INTRAVENOUS | Status: DC | PRN

## 2012-03-30 MED ORDER — FUROSEMIDE 80 MG PO TABS
80.0000 mg | ORAL_TABLET | Freq: Two times a day (BID) | ORAL | Status: DC
  Administered 2012-04-01 – 2012-04-02 (×5): 80 mg via ORAL
  Filled 2012-03-30 (×9): qty 1

## 2012-03-30 MED ORDER — LEVOTHYROXINE SODIUM 75 MCG PO TABS
75.0000 ug | ORAL_TABLET | Freq: Every day | ORAL | Status: DC
  Administered 2012-04-01 – 2012-04-02 (×2): 75 ug via ORAL
  Filled 2012-03-30 (×5): qty 1

## 2012-03-30 MED ORDER — FLUTICASONE-SALMETEROL 500-50 MCG/DOSE IN AEPB
1.0000 | INHALATION_SPRAY | Freq: Two times a day (BID) | RESPIRATORY_TRACT | Status: DC
  Administered 2012-03-31 – 2012-04-02 (×5): 1 via RESPIRATORY_TRACT
  Filled 2012-03-30: qty 14

## 2012-03-30 MED ORDER — ASPIRIN 81 MG PO CHEW
81.0000 mg | CHEWABLE_TABLET | Freq: Every day | ORAL | Status: DC
  Administered 2012-04-01 – 2012-04-02 (×2): 81 mg via ORAL
  Filled 2012-03-30 (×3): qty 1

## 2012-03-30 MED ORDER — RENA-VITE PO TABS
1.0000 | ORAL_TABLET | Freq: Every day | ORAL | Status: DC
  Administered 2012-04-01 – 2012-04-02 (×3): 1 via ORAL
  Filled 2012-03-30 (×4): qty 1

## 2012-03-30 MED ORDER — SODIUM CHLORIDE 0.9 % IJ SOLN
3.0000 mL | Freq: Two times a day (BID) | INTRAMUSCULAR | Status: DC
  Administered 2012-03-31 – 2012-04-02 (×5): 3 mL via INTRAVENOUS

## 2012-03-30 NOTE — Progress Notes (Signed)
Patient admitted from [facility] via bed.  No c/o sob or chest pain.  Skin warm and dry resp. Even and regular.  Left chest permacath in place with clean, dry dressing.  Right lower leg dressing dry and intact and brace noted to right leg.
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
## 2012-03-31 ENCOUNTER — Other Ambulatory Visit (HOSPITAL_COMMUNITY): Payer: PRIVATE HEALTH INSURANCE

## 2012-03-31 ENCOUNTER — Inpatient Hospital Stay (HOSPITAL_COMMUNITY): Payer: PRIVATE HEALTH INSURANCE

## 2012-03-31 ENCOUNTER — Encounter (HOSPITAL_COMMUNITY): Payer: Self-pay | Admitting: General Practice

## 2012-03-31 LAB — GLUCOSE, CAPILLARY
Glucose-Capillary: 127 mg/dL — ABNORMAL HIGH (ref 70–99)
Glucose-Capillary: 230 mg/dL — ABNORMAL HIGH (ref 70–99)
Glucose-Capillary: 180 mg/dL — ABNORMAL HIGH (ref 70–99)

## 2012-03-31 LAB — BASIC METABOLIC PANEL
Chloride: 96 mEq/L (ref 96–112)
Creatinine, Ser: 4.6 mg/dL — ABNORMAL HIGH (ref 0.50–1.35)
GFR calc Af Amer: 13 mL/min — ABNORMAL LOW (ref >90)
GFR calc non Af Amer: 11 mL/min — ABNORMAL LOW (ref >90)
Potassium: 5 mEq/L (ref 3.5–5.1)

## 2012-03-31 LAB — CBC
HCT: 36.1 % — ABNORMAL LOW (ref 39.0–52.0)
MCHC: 30.2 g/dL (ref 30.0–36.0)
RDW: 16 % — ABNORMAL HIGH (ref 11.5–15.5)
WBC: 8.7 10*3/uL (ref 4.0–10.5)

## 2012-03-31 LAB — HEPATITIS B SURFACE ANTIGEN: Hepatitis B Surface Ag: NEGATIVE

## 2012-03-31 LAB — MRSA PCR SCREENING: MRSA by PCR: POSITIVE — AB

## 2012-03-31 MED ORDER — FLUTICASONE PROPIONATE HFA 110 MCG/ACT IN AERO
1.0000 | INHALATION_SPRAY | Freq: Two times a day (BID) | RESPIRATORY_TRACT | Status: DC
  Administered 2012-04-01 – 2012-04-02 (×3): 1 via RESPIRATORY_TRACT
  Filled 2012-03-31: qty 12

## 2012-03-31 MED ORDER — DARBEPOETIN ALFA-POLYSORBATE 100 MCG/0.5ML IJ SOLN
100.0000 ug | INTRAMUSCULAR | Status: DC
  Administered 2012-03-31: 100 ug via INTRAVENOUS
  Filled 2012-03-31: qty 0.5

## 2012-03-31 MED ORDER — PARICALCITOL 5 MCG/ML IV SOLN: 2.0000 ug | INTRAVENOUS | Status: DC

## 2012-03-31 MED ORDER — COLLAGENASE 250 UNIT/GM EX OINT
1.0000 "application " | TOPICAL_OINTMENT | Freq: Every day | CUTANEOUS | Status: DC
  Administered 2012-03-31 – 2012-04-02 (×3): 1 via TOPICAL
  Filled 2012-03-31: qty 30

## 2012-03-31 MED ORDER — PARICALCITOL 5 MCG/ML IV SOLN
1.0000 ug | INTRAVENOUS | Status: DC
  Administered 2012-03-31: 1 ug via INTRAVENOUS
  Filled 2012-03-31 (×2): qty 0.2

## 2012-03-31 MED ORDER — VANCOMYCIN HCL 1000 MG IV SOLR
750.0000 mg | INTRAVENOUS | Status: DC
  Administered 2012-03-31: 750 mg via INTRAVENOUS
  Filled 2012-03-31 (×3): qty 750

## 2012-03-31 MED ORDER — INSULIN ASPART 100 UNIT/ML ~~LOC~~ SOLN
0.0000 [IU] | Freq: Three times a day (TID) | SUBCUTANEOUS | Status: DC
  Administered 2012-03-31: 3 [IU] via SUBCUTANEOUS
  Administered 2012-04-01: 2 [IU] via SUBCUTANEOUS

## 2012-03-31 MED ORDER — CHLORHEXIDINE GLUCONATE CLOTH 2 % EX PADS
6.0000 | MEDICATED_PAD | Freq: Every day | CUTANEOUS | Status: DC
  Administered 2012-03-31 – 2012-04-03 (×4): 6 via TOPICAL

## 2012-03-31 MED ORDER — MUPIROCIN 2 % EX OINT
1.0000 "application " | TOPICAL_OINTMENT | Freq: Two times a day (BID) | CUTANEOUS | Status: DC
  Administered 2012-03-31 – 2012-04-02 (×6): 1 via NASAL
  Filled 2012-03-31: qty 22

## 2012-03-31 MED ORDER — DARBEPOETIN ALFA-POLYSORBATE 100 MCG/0.5ML IJ SOLN
INTRAMUSCULAR | Status: AC
  Filled 2012-03-31: qty 0.5

## 2012-03-31 MED ORDER — PARICALCITOL 5 MCG/ML IV SOLN
INTRAVENOUS | Status: AC
  Filled 2012-03-31: qty 1

## 2012-03-31 NOTE — Progress Notes (Signed)
Inpatient Diabetes Program Recommendations  AACE/ADA: New Consensus Statement on Inpatient Glycemic Control (2009)  Target Ranges:  Prepandial:   less than 140 mg/dL      Peak postprandial:   less than 180 mg/dL (1-2 hours)      Critically ill patients:  140 - 180 mg/dL   Reason for Visit: Patient with history of diabetes.  Admitted with LE ulcer.  Inpatient Diabetes Program Recommendations Correction (SSI): Please add Novolog Sensitive correction scale (SSI) tid A1C: Please check current A1C level (last A1C was 8.5% on 09/09/2011) Note: Will follow. Ambrose Finland RN, MSN, CDE Diabetes Coordinator Inpatient Diabetes Program [phone]
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
## 2012-03-31 NOTE — Progress Notes (Signed)
Clinical Social Work Department BRIEF PSYCHOSOCIAL ASSESSMENT 03/31/2012  Patient:  [name] [name], [name] [name]     Account Number:  [id number]     Admit date:  03/30/2012  Clinical Social Worker:  Jacelyn Grip  Date/Time:  03/31/2012 02:30 PM  Referred by:  Physician  Date Referred:  03/31/2012 Referred for  SNF Placement   Other Referral:   Interview type:  Patient Other interview type:    PSYCHOSOCIAL DATA Living Status:  FACILITY Admitted from facility:  [facility] Level of care:  Skilled Nursing Facility Primary support name:  Delaney Meigs McCurry/daughter/[phone] Primary support relationship to patient:  CHILD, ADULT Degree of support available:   adequate    CURRENT CONCERNS Current Concerns  Post-Acute Placement   Other Concerns:    SOCIAL WORK ASSESSMENT / PLAN CSW met with pt at bedside to discuss pt discharge needs. Pt discussed that he is currently a resident at [facility]. Pt plans to return to [facility] at discharge. CSW completed FL2 and faxed clinical infomation to [facility] and called facility who confirmed pt can return at discharge. CSW to facilitate pt discharge needs when pt medically stable for discharge.   Assessment/plan status:  Other - See comment Other assessment/ plan:   discharge planning   Information/referral to community resources:   none at this time    PATIENT'S/FAMILY'S RESPONSE TO PLAN OF CARE: Pt alert and oriented and pleasant. Pt reports that he has been at the facility for one year and his wife of 55 years is a resident at the facility as well. Pt motivated to return when medically stable.     Jacklynn Lewis, MSW, LCSWA  Clinical Social Work [phone]

## 2012-03-31 NOTE — Progress Notes (Signed)
Subjective: No new issues.  Objective: Vital signs in last 24 hours: Temp:  [97.4 F (36.3 C)-99.6 F (37.6 C)] 98.6 F (37 C) (04/26 1324) Pulse Rate:  [77-82] 82  (04/26 1324) Resp:  [17-20] 17  (04/26 1324) BP: (153-168)/(75-82) 161/78 mmHg (04/26 1324) SpO2:  [90 %-95 %] 90 % (04/26 1324) Weight:  [76.4 kg (168 lb 6.9 oz)] 76.4 kg (168 lb 6.9 oz) (04/25 1900) Weight change:  Last BM Date: 03/30/12  Intake/Output from previous day: 04/25 0701 - 04/26 0700 In: 240 [P.O.:240] Out: 300 [Urine:300] Total I/O In: 443 [P.O.:440; I.V.:3] Out: 350 [Urine:350]   Physical Exam: General: Comfortable, alert, communicative, fully oriented, not short of breath at rest.  HEENT:  Mild clinical pallor, no jaundice, no conjunctival injection or discharge. Hydration appears fair. NECK:  Supple, JVP not seen, no carotid bruits, no palpable lymphadenopathy, no palpable goiter. CHEST:  Clinically clear to auscultation, no wheezes, no crackles. HEART:  Sounds 1 and 2 heard, normal, regular, no murmurs. ABDOMEN:  Moderately obese, soft, non-tender, no palpable organomegaly, no palpable masses, normal bowel sounds. GENITALIA:  Not examined. LOWER EXTREMITIES:  No pitting edema, palpable peripheral pulses. Patient has a 6 cm x 4 cm wound on the lateral aspect of his right lower leg, which looks infected, has exposed tendon and is odoriferous. In addition, there is a smaller 1 cm x 2 cm wound on heel of right foot, in similar condition. MUSCULOSKELETAL SYSTEM:  Generalized osteoarthritic changes, otherwise, normal. CENTRAL NERVOUS SYSTEM:  No focal neurologic deficit on gross examination.  Lab Results:  Basename 03/31/12 0600 03/30/12 2240  WBC 8.7 8.1  HGB 10.9* 10.5*  HCT 36.1* 34.6*  PLT 277 277    Basename 03/31/12 0600 03/30/12 2240  NA 134* --  K 5.0 --  CL 96 --  CO2 25 --  GLUCOSE 147* --  BUN 43* --  CREATININE 4.60* 4.44*  CALCIUM 9.4 --   Recent Results (from the past 240  hour(s))  MRSA PCR SCREENING     Status: Abnormal   Collection Time   03/31/12  5:19 AM      Component Value Range Status Comment   MRSA by PCR POSITIVE (*) NEGATIVE  Final      Studies/Results: No results found.  Medications: Scheduled Meds:   . amLODipine  5 mg Oral Daily  . aspirin  81 mg Oral Daily  . calcium acetate  1,334 mg Oral TID WC  . carvedilol  25 mg Oral BID WC  . Chlorhexidine Gluconate Cloth  6 each Topical Q0600  . clopidogrel  75 mg Oral Daily  . collagenase  1 application Topical Daily  . darbepoetin (ARANESP) injection - DIALYSIS  100 mcg Intravenous Q Fri-HD  . dutasteride  0.5 mg Oral Daily  . enoxaparin  30 mg Subcutaneous Q24H  . famotidine  10 mg Oral BID  . ferrous sulfate  325 mg Oral Q breakfast  . fluticasone  1 puff Inhalation BID  . Fluticasone-Salmeterol  1 puff Inhalation Q12H  . furosemide  80 mg Oral BID  . gabapentin  300 mg Oral BID  . imipenem-cilastatin  250 mg Intravenous Q12H  . insulin aspart  5 Units Subcutaneous TID WC  . insulin glargine  30 Units Subcutaneous QHS  . isosorbide mononitrate  120 mg Oral Daily  . lanthanum  1,000 mg Oral TID WC  . levothyroxine  75 mcg Oral QAC breakfast  . multivitamin  1 tablet Oral QHS  .  mupirocin ointment  1 application Nasal BID  . olmesartan  10 mg Oral Daily  . pantoprazole  40 mg Oral Q1200  . paricalcitol  1 mcg Intravenous Q M,W,F-HD  . rOPINIRole  2 mg Oral QHS  . simvastatin  5 mg Oral q1800  . sodium chloride  3 mL Intravenous Q12H  . vancomycin  750 mg Intravenous Once  . vancomycin  750 mg Intravenous Q M,W,F-HD  . DISCONTD: aspirin  81 mg Oral Daily  . DISCONTD: darbepoetin  100 mcg Subcutaneous Q7 days  . DISCONTD: darbepoetin (ARANESP) injection - DIALYSIS  100 mcg Intravenous Q Sat-HD  . DISCONTD: imipenem-cilastatin  500 mg Intravenous Q12H  . DISCONTD: insulin regular  5 Units Subcutaneous TID AC  . DISCONTD: pantoprazole  40 mg Oral Daily  . DISCONTD: paricalcitol  2  mcg Intravenous 3 times weekly  . DISCONTD: rOPINIRole  2 mg Oral QHS   Continuous Infusions:  PRN Meds:.sodium chloride, acetaminophen, acetaminophen, benzonatate, calcium carbonate (dosed in mg elemental calcium), HYDROcodone-acetaminophen, ondansetron, sodium chloride, zolpidem  Assessment/Plan:  Principal Problem:  *Foot ulcer due to secondary DM: Patient presented with an obviously infected RLE wound, which he says has been present only for 2 weeks, without antecedent trauma. He is now on Vancmycin/Primaxin day#2. Wound care team has seen ,and consensus is that orthopedic consultation is indicated. This has been requested accordingly. Dr Victorino Dike will see. Meanwhile we shall request MRI, to rule out osteomyelitis. Patient has well perfused RLE. Active Problems:  1. Cellulitis: See above.  2. ESRD (end stage renal disease): Patient is on HD, M-W-F. He has been seen by the renal team, and will be commenced on scheduled dialysis. Consultation was provided by Dr. Arta Silence.  3. DM2 (diabetes mellitus, type 2): CBGs appear reasonable at this time, on diet, SSI and Lantus.  4. HTN (hypertension): BP is somewhat elevated today. This should improve with dialysis.    LOS: 1 day   Arely Tinner,[name] [name] 03/31/2012, 2:57 PM

## 2012-03-31 NOTE — Progress Notes (Signed)
Pt admitted from [facility]. Pt states he comes from [place]. Pt is AOX3. Pt states he uses Wheelchair. He has boot with brace at bedside. Pt has stage I to buttocks and wounds to right leg and right foot, dressing placed. Pt has been oriented to unit, staff, policies, and safety.  Bed alarm, red socks, yellow arm band on patient. Will continue to monitor
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
## 2012-03-31 NOTE — Progress Notes (Signed)
03/31/2012 Eran Windish [facility] Case Management Note [phone]  Utilization review completed.  

## 2012-03-31 NOTE — Consult Note (Signed)
WOC consult Note Reason for Consult: Consult requested for right leg wound.  Pt also has a right heel wound.  Pt states he has "had both for about 2 weeks, and the nursing home was helping take care of them." Wound type :Pressure Ulcer POA: Yes    Right heel unstageable 2X5X.3cm, appears to be a previous blister surrounding area which has evolved into open wound .5X.5cm, dark purple-black slough/eschar, small tan drainage, no odor.   Right leg full thickness wound; 6X3X1cm, visible tendons surrounded by eschar and slough, strong foul odor, large amt green drainage, erythremia surrounding wound edges, very painful to touch.  Pt is unable to remember source of the wound. Recommend ortho consult R/T involvement of tendons and unknown depth of non-viable tissue.  Discussed plan of care with Dr Brien Few.  Santyl ointment for chemical debridement of right heel and right leg until further recommendations available from ortho service.  Float heel to reduce pressure.  Will not plan to follow further unless re-consulted.  [address], RN, MSN, [employer]  [phone]

## 2012-03-31 NOTE — Progress Notes (Signed)
Patient's MRI has been cancelled due to the fact he has an ICD in place. @ 1912hrs 03/31/2012 Call to be placed to RN when they are out of report and available to speak on the phone.

## 2012-04-01 DIAGNOSIS — L97209 Non-pressure chronic ulcer of unspecified calf with unspecified severity: Secondary | ICD-10-CM

## 2012-04-01 LAB — GLUCOSE, CAPILLARY
Glucose-Capillary: 200 mg/dL — ABNORMAL HIGH (ref 70–99)
Glucose-Capillary: 312 mg/dL — ABNORMAL HIGH (ref 70–99)
Glucose-Capillary: 314 mg/dL — ABNORMAL HIGH (ref 70–99)
Glucose-Capillary: 251 mg/dL — ABNORMAL HIGH (ref 70–99)

## 2012-04-01 LAB — RENAL FUNCTION PANEL
Albumin: 2.4 g/dL — ABNORMAL LOW (ref 3.5–5.2)
BUN: 26 mg/dL — ABNORMAL HIGH (ref 6–23)
Chloride: 96 mEq/L (ref 96–112)
GFR calc Af Amer: 23 mL/min — ABNORMAL LOW (ref >90)
GFR calc non Af Amer: 19 mL/min — ABNORMAL LOW (ref >90)
Potassium: 4 mEq/L (ref 3.5–5.1)
Sodium: 136 mEq/L (ref 135–145)

## 2012-04-01 LAB — CBC
HCT: 37.9 % — ABNORMAL LOW (ref 39.0–52.0)
RDW: 16 % — ABNORMAL HIGH (ref 11.5–15.5)
WBC: 8.7 10*3/uL (ref 4.0–10.5)

## 2012-04-01 MED ORDER — INSULIN GLARGINE 100 UNIT/ML ~~LOC~~ SOLN
35.0000 [IU] | Freq: Every day | SUBCUTANEOUS | Status: DC
  Administered 2012-04-01: 35 [IU] via SUBCUTANEOUS

## 2012-04-01 MED ORDER — INSULIN ASPART 100 UNIT/ML ~~LOC~~ SOLN
0.0000 [IU] | Freq: Three times a day (TID) | SUBCUTANEOUS | Status: DC
  Administered 2012-04-01 (×2): 11 [IU] via SUBCUTANEOUS
  Administered 2012-04-02 (×2): 5 [IU] via SUBCUTANEOUS
  Administered 2012-04-02 – 2012-04-03 (×2): 3 [IU] via SUBCUTANEOUS

## 2012-04-01 NOTE — Progress Notes (Signed)
Manual BP is 88/58.  He is asymptomatic.  Called [facility], Elray Mcgregor, and she was made aware.  Will continue to monitor patient and call MD if becomes symptomatic.  Stacie Glaze 04/01/2012

## 2012-04-01 NOTE — Progress Notes (Signed)
VASCULAR LAB PRELIMINARY  ARTERIAL  ABI completed:Unable to ascertain ABI's secondary to calcified waveforms.    RIGHT    LEFT    PRESSURE WAVEFORM  PRESSURE WAVEFORM  BRACHIAL Restiricted  BRACHIAL 125   DP >300 SDM DP >300 SDM  AT   AT    PT >300 SDM PT >300 SDM  PER   PER    GREAT TOE abnormal NA GREAT TOE abnormal NA    RIGHT LEFT  ABI >1.0 >1.0     Sydnee Cabal, 04/01/2012, 9:11 AM

## 2012-04-01 NOTE — Progress Notes (Signed)
Subjective: Feels better.  Objective: Vital signs in last 24 hours: Temp:  [97.1 F (36.2 C)-98.8 F (37.1 C)] 98.1 F (36.7 C) (04/27 0948) Pulse Rate:  [60-95] 82  (04/27 0948) Resp:  [16-22] 18  (04/27 0948) BP: (99-163)/(47-81) 143/78 mmHg (04/27 0948) SpO2:  [90 %-99 %] 90 % (04/27 0948) Weight:  [75.4 kg (166 lb 3.6 oz)-78.8 kg (173 lb 11.6 oz)] 75.4 kg (166 lb 3.6 oz) (04/26 2245) Weight change: 2.4 kg (5 lb 4.7 oz) Last BM Date: 03/31/12  Intake/Output from previous day: 04/26 0701 - 04/27 0700 In: 763 [P.O.:560; I.V.:3; IV Piggyback:200] Out: 3246 [Urine:550]     Physical Exam: General: Comfortable, alert, communicative, fully oriented, not short of breath at rest.  HEENT:  Mild clinical pallor, no jaundice, no conjunctival injection or discharge. Hydration appears satisfactory. NECK:  Supple, JVP not seen, no carotid bruits, no palpable lymphadenopathy, no palpable goiter. CHEST:  Clinically clear to auscultation, no wheezes, no crackles. HEART:  Sounds 1 and 2 heard, normal, regular, no murmurs. ABDOMEN:  Moderately obese, soft, non-tender, no palpable organomegaly, no palpable masses, normal bowel sounds. GENITALIA:  Not examined. LOWER EXTREMITIES:  No pitting edema, palpable peripheral pulses. Patient has a 6 cm x 4 cm wound on the lateral aspect of his right lower leg, which looks infected, has exposed tendon and is odoriferous. In addition, there is a smaller 1 cm x 2 cm wound on heel of right foot, in similar condition, now under dressings. MUSCULOSKELETAL SYSTEM:  Generalized osteoarthritic changes, otherwise, normal. CENTRAL NERVOUS SYSTEM:  No focal neurologic deficit on gross examination.  Lab Results:  Basename 04/01/12 0530 03/31/12 0600  WBC 8.7 8.7  HGB 11.4* 10.9*  HCT 37.9* 36.1*  PLT 295 277    Basename 04/01/12 0530 03/31/12 0600  NA 136 134*  K 4.0 5.0  CL 96 96  CO2 23 25  GLUCOSE 223* 147*  BUN 26* 43*  CREATININE 2.93* 4.60*    CALCIUM 9.5 9.4   Recent Results (from the past 240 hour(s))  MRSA PCR SCREENING     Status: Abnormal   Collection Time   03/31/12  5:19 AM      Component Value Range Status Comment   MRSA by PCR POSITIVE (*) NEGATIVE  Final      Studies/Results: No results found.  Medications: Scheduled Meds:    . amLODipine  5 mg Oral Daily  . aspirin  81 mg Oral Daily  . calcium acetate  1,334 mg Oral TID WC  . carvedilol  25 mg Oral BID WC  . Chlorhexidine Gluconate Cloth  6 each Topical Q0600  . clopidogrel  75 mg Oral Daily  . collagenase  1 application Topical Daily  . darbepoetin      . darbepoetin (ARANESP) injection - DIALYSIS  100 mcg Intravenous Q Fri-HD  . dutasteride  0.5 mg Oral Daily  . enoxaparin  30 mg Subcutaneous Q24H  . famotidine  10 mg Oral BID  . ferrous sulfate  325 mg Oral Q breakfast  . fluticasone  1 puff Inhalation BID  . Fluticasone-Salmeterol  1 puff Inhalation Q12H  . furosemide  80 mg Oral BID  . gabapentin  300 mg Oral BID  . imipenem-cilastatin  250 mg Intravenous Q12H  . insulin aspart  0-9 Units Subcutaneous TID WC  . insulin glargine  30 Units Subcutaneous QHS  . isosorbide mononitrate  120 mg Oral Daily  . lanthanum  1,000 mg Oral TID WC  .  levothyroxine  75 mcg Oral QAC breakfast  . multivitamin  1 tablet Oral QHS  . mupirocin ointment  1 application Nasal BID  . olmesartan  10 mg Oral Daily  . pantoprazole  40 mg Oral Q1200  . paricalcitol      . paricalcitol  1 mcg Intravenous Q M,W,F-HD  . rOPINIRole  2 mg Oral QHS  . simvastatin  5 mg Oral q1800  . sodium chloride  3 mL Intravenous Q12H  . vancomycin  750 mg Intravenous Q M,W,F-HD  . DISCONTD: darbepoetin (ARANESP) injection - DIALYSIS  100 mcg Intravenous Q Sat-HD  . DISCONTD: fluticasone  1 puff Inhalation BID  . DISCONTD: insulin aspart  5 Units Subcutaneous TID WC  . DISCONTD: paricalcitol  2 mcg Intravenous 3 times weekly   Continuous Infusions:  PRN Meds:.sodium chloride,  acetaminophen, acetaminophen, benzonatate, calcium carbonate (dosed in mg elemental calcium), HYDROcodone-acetaminophen, ondansetron, sodium chloride, zolpidem  Assessment/Plan:  Principal Problem:  *Foot ulcer due to secondary DM: Patient presented with an obviously infected lage and deep RLE wound, which he says has been present only for 2 weeks, without antecedent trauma. He also has a more chronic right heel wound. He is now on Vancmycin/Primaxin day#3. Dr Victorino Dike provided orthopedic consultation, and has requested an arterial doppler, to evaluate state of peripheral circulation. Unfortunately, MRI was not feasible, due to presence of a pacer. CT scan may be a suitable alternative, but we shall defer to orthopedic recommendations. Certainly, some form of surgical intervention will be needed.. Active Problems:  1. Cellulitis: See above.  2. ESRD (end stage renal disease): Patient is on HD, M-W-F. He has been seen by the renal team, and re-commenced on scheduled dialysis. Consultation was provided by Dr. Arta Silence.  3. DM2 (diabetes mellitus, type 2): CBGs are sub-optimal today. Managing with diet, SSI and Lantus.  4. HTN (hypertension): Now controlled. On Amlodipine, Carvedilol and Lasix.    LOS: 2 days   [name] [name],[name] 04/01/2012, 9:56 AM

## 2012-04-01 NOTE — Progress Notes (Signed)
Subjective:  No current complaints.  Objective: Vital signs in last 24 hours: Temp:  [97.1 F (36.2 C)-98.8 F (37.1 C)] 98.8 F (37.1 C) (04/27 0554) Pulse Rate:  [60-95] 77  (04/27 0554) Resp:  [16-22] 20  (04/27 0554) BP: (99-163)/(47-81) 125/72 mmHg (04/27 0554) SpO2:  [90 %-99 %] 93 % (04/27 0554) Weight:  [75.4 kg (166 lb 3.6 oz)-78.8 kg (173 lb 11.6 oz)] 75.4 kg (166 lb 3.6 oz) (04/26 2245) Weight change: 2.4 kg (5 lb 4.7 oz)  Intake/Output from previous day: 04/26 0701 - 04/27 0700 In: 763 [P.O.:560; I.V.:3; IV Piggyback:200] Out: 3246 [Urine:550]   EXAM: General appearance:  Alert, in no apparent distress Resp:  CTA bilaterally Cardio:  RRR without murmur GI: + BS, soft and nontender Extremities:  No edema, dressing over right leg wound Access:  Right IJ catheter  Lab Results:  Basename 04/01/12 0530 03/31/12 0600  WBC 8.7 8.7  HGB 11.4* 10.9*  HCT 37.9* 36.1*  PLT 295 277   BMET:  Basename 04/01/12 0530 03/31/12 0600  NA 136 134*  K 4.0 5.0  CL 96 96  CO2 23 25  GLUCOSE 223* 147*  BUN 26* 43*  CREATININE 2.93* 4.60*  CALCIUM 9.5 9.4  ALBUMIN 2.4* --   No results found for this basename: PTH:2 in the last 72 hours Iron Studies: No results found for this basename: IRON,TIBC,TRANSFERRIN,FERRITIN in the last 72 hours  Assessment/Plan: 1. Right lower leg and heel ulcers - likely secondary to poorly-controlled DM; began Vancomycin and Primaxin IV, seen by Dr. Victorino Dike, MRI to evaluate for abscess and osteo and lower extremity Dopplers to evaluate vascular status pending.   2. ESRD - HD on MWF @ [place], using IJ catheter (barchiocephalic AVF, placed 09/20/11, never matured), K stable at 4 s/p HD yesterday. 3. Hypertension/volume - BP most recently 163/81 on Amlodipine 5 mg qd and Coreg 25 mg bid; wt 75.4  s/p net UF of 2696 ml yesterday (outpatient EDW 76 kg). 4. Anemia - Hgb 11.4 on outpatient Epogen 6600 U and IV Fe per HD; however, will receive oral Fe  secondary to ferritin > 1300. 5. Metabolic bone disease - Ca 9.5 (10.8 corrected), P 3.1, on Zemplar 1 mcg per HD, Phoslo 2 and Fosrenol 1 g with meals. 6. Nutrition - last Alb 2.4. 7. CAD - s/p CABG in '91 followed by 3 stents; on ASA, Plavix, Coreg and Imdur. 8. DM - on Insulin, Neurontin for diabetic neuropathy; Hgb A1C 9.3 on 4/24.. 9. COPD - on inhaler. 10. Urinary retention - with indwelling Foley.     LOS: 2 days   LYLES,CHARLES 04/01/2012,7:21 AM  Patient seen and examined and agree with assessment and plan as above.  Vinson Moselle  MD [employer] [phone] pgr    [phone] cell 04/01/2012, 1:48 PM

## 2012-04-01 NOTE — Progress Notes (Signed)
Subjective: Pt s complaints.  No n/v/f/c.    Objective: Vital signs in last 24 hours: Temp:  [97.1 F (36.2 C)-98.8 F (37.1 C)] 98.8 F (37.1 C) (04/27 0554) Pulse Rate:  [60-95] 77  (04/27 0554) Resp:  [16-22] 20  (04/27 0554) BP: (99-163)/(47-81) 125/72 mmHg (04/27 0554) SpO2:  [90 %-99 %] 93 % (04/27 0554) Weight:  [75.4 kg (166 lb 3.6 oz)-78.8 kg (173 lb 11.6 oz)] 75.4 kg (166 lb 3.6 oz) (04/26 2245)  Intake/Output from previous day: 04/26 0701 - 04/27 0700 In: 763 [P.O.:560; I.V.:3; IV Piggyback:200] Out: 3246 [Urine:550] Intake/Output this shift:     Basename 04/01/12 0530 03/31/12 0600 03/30/12 2240  HGB 11.4* 10.9* 10.5*    Basename 04/01/12 0530 03/31/12 0600  WBC 8.7 8.7  RBC 3.95* 3.78*  HCT 37.9* 36.1*  PLT 295 277    Basename 04/01/12 0530 03/31/12 0600  NA 136 134*  K 4.0 5.0  CL 96 96  CO2 23 25  BUN 26* 43*  CREATININE 2.93* 4.60*  GLUCOSE 223* 147*  CALCIUM 9.5 9.4   No results found for this basename: LABPT:2,INR:2 in the last 72 hours  PE:  Wounds dressed and dry.  Assessment/Plan:  Right leg and heel nonhealing diabetic ulcers - MRI cancelled due to implanted defibrillator.  Awaiting arterial doppler studies to determine vascular status of leg.  Based on those results we'll decide about vascular consultation.  [name] [name] 04/01/2012, 8:47 AM

## 2012-04-02 DIAGNOSIS — I739 Peripheral vascular disease, unspecified: Secondary | ICD-10-CM

## 2012-04-02 DIAGNOSIS — L98499 Non-pressure chronic ulcer of skin of other sites with unspecified severity: Secondary | ICD-10-CM

## 2012-04-02 LAB — RENAL FUNCTION PANEL
Albumin: 2.2 g/dL — ABNORMAL LOW (ref 3.5–5.2)
BUN: 46 mg/dL — ABNORMAL HIGH (ref 6–23)
Chloride: 93 mEq/L — ABNORMAL LOW (ref 96–112)
GFR calc Af Amer: 14 mL/min — ABNORMAL LOW (ref >90)
GFR calc non Af Amer: 12 mL/min — ABNORMAL LOW (ref >90)
Phosphorus: 5 mg/dL — ABNORMAL HIGH (ref 2.3–4.6)
Potassium: 3.5 mEq/L (ref 3.5–5.1)
Sodium: 132 mEq/L — ABNORMAL LOW (ref 135–145)

## 2012-04-02 LAB — GLUCOSE, CAPILLARY: Glucose-Capillary: 227 mg/dL — ABNORMAL HIGH (ref 70–99)

## 2012-04-02 LAB — CBC
HCT: 34.3 % — ABNORMAL LOW (ref 39.0–52.0)
Hemoglobin: 10.5 g/dL — ABNORMAL LOW (ref 13.0–17.0)
RBC: 3.62 MIL/uL — ABNORMAL LOW (ref 4.22–5.81)

## 2012-04-02 MED ORDER — INSULIN GLARGINE 100 UNIT/ML ~~LOC~~ SOLN
40.0000 [IU] | Freq: Every day | SUBCUTANEOUS | Status: DC
  Administered 2012-04-02: 40 [IU] via SUBCUTANEOUS

## 2012-04-02 NOTE — Progress Notes (Addendum)
Subjective: No new developments.  Objective: Vital signs in last 24 hours: Temp:  [97.5 F (36.4 C)-98.4 F (36.9 C)] 97.6 F (36.4 C) (04/28 0950) Pulse Rate:  [64-85] 68  (04/28 0950) Resp:  [16-18] 17  (04/28 0950) BP: (80-149)/(45-80) 149/80 mmHg (04/28 0950) SpO2:  [89 %-95 %] 95 % (04/28 0950) Weight:  [76 kg (167 lb 8.8 oz)] 76 kg (167 lb 8.8 oz) (04/27 1940) Weight change: -2.8 kg (-6 lb 2.8 oz) Last BM Date: 04/01/12  Intake/Output from previous day: 04/27 0701 - 04/28 0700 In: 1037 [P.O.:834; I.V.:3; IV Piggyback:200] Out: 50 [Urine:50]     Physical Exam: General: Comfortable, alert, communicative, fully oriented, not short of breath at rest.  HEENT:  Mild clinical pallor, no jaundice, no conjunctival injection or discharge. Hydration appears satisfactory. NECK:  Supple, JVP not seen, no carotid bruits, no palpable lymphadenopathy, no palpable goiter. CHEST:  Clinically clear to auscultation, no wheezes, no crackles. HEART:  Sounds 1 and 2 heard, normal, regular, no murmurs. ABDOMEN:  Moderately obese, soft, non-tender, no palpable organomegaly, no palpable masses, normal bowel sounds. GENITALIA:  Not examined. LOWER EXTREMITIES:  No pitting edema, palpable peripheral pulses. Patient has a 6 cm x 4 cm wound on the lateral aspect of his right lower leg, which looks infected, has exposed tendon and is odoriferous. In addition, there is a smaller 1 cm x 2 cm wound on heel of right foot, in similar condition, now under dressings. MUSCULOSKELETAL SYSTEM:  Generalized osteoarthritic changes, otherwise, normal. CENTRAL NERVOUS SYSTEM:  No focal neurologic deficit on gross examination.  Lab Results:  Basename 04/02/12 0653 04/01/12 0530  WBC 7.6 8.7  HGB 10.5* 11.4*  HCT 34.3* 37.9*  PLT 269 295    Basename 04/02/12 0653 04/01/12 0530  NA 132* 136  K 3.5 4.0  CL 93* 96  CO2 23 23  GLUCOSE 228* 223*  BUN 46* 26*  CREATININE 4.45* 2.93*  CALCIUM 9.7 9.5   Recent  Results (from the past 240 hour(s))  MRSA PCR SCREENING     Status: Abnormal   Collection Time   03/31/12  5:19 AM      Component Value Range Status Comment   MRSA by PCR POSITIVE (*) NEGATIVE  Final      Studies/Results: No results found.  Medications: Scheduled Meds:    . amLODipine  5 mg Oral Daily  . aspirin  81 mg Oral Daily  . calcium acetate  1,334 mg Oral TID WC  . carvedilol  25 mg Oral BID WC  . Chlorhexidine Gluconate Cloth  6 each Topical Q0600  . clopidogrel  75 mg Oral Daily  . collagenase  1 application Topical Daily  . darbepoetin (ARANESP) injection - DIALYSIS  100 mcg Intravenous Q Fri-HD  . dutasteride  0.5 mg Oral Daily  . enoxaparin  30 mg Subcutaneous Q24H  . famotidine  10 mg Oral BID  . ferrous sulfate  325 mg Oral Q breakfast  . fluticasone  1 puff Inhalation BID  . Fluticasone-Salmeterol  1 puff Inhalation Q12H  . furosemide  80 mg Oral BID  . gabapentin  300 mg Oral BID  . imipenem-cilastatin  250 mg Intravenous Q12H  . insulin aspart  0-15 Units Subcutaneous TID WC  . insulin glargine  35 Units Subcutaneous QHS  . isosorbide mononitrate  120 mg Oral Daily  . lanthanum  1,000 mg Oral TID WC  . levothyroxine  75 mcg Oral QAC breakfast  . multivitamin  1  tablet Oral QHS  . mupirocin ointment  1 application Nasal BID  . olmesartan  10 mg Oral Daily  . pantoprazole  40 mg Oral Q1200  . paricalcitol  1 mcg Intravenous Q M,W,F-HD  . rOPINIRole  2 mg Oral QHS  . simvastatin  5 mg Oral q1800  . sodium chloride  3 mL Intravenous Q12H  . vancomycin  750 mg Intravenous Q M,W,F-HD   Continuous Infusions:  PRN Meds:.sodium chloride, acetaminophen, acetaminophen, benzonatate, calcium carbonate (dosed in mg elemental calcium), HYDROcodone-acetaminophen, ondansetron, sodium chloride, zolpidem  Assessment/Plan:  Principal Problem:  *Foot ulcer due to secondary DM: Patient presented with an obviously infected lage and deep RLE wound, which he says has been  present only for 2 weeks, without antecedent trauma. He also has a more chronic right heel wound. He is now on Vancmycin/Primaxin day#4. Dr Victorino Dike provided orthopedic consultation. Unfortunately, MRI was not feasible, due to presence of a pacer. CT scan may be a suitable alternative, but we shall defer to orthopedic recommendations. Dr Victorino Dike feel that evaluation of peripheral arterial circulation is indicated. Dr Hart Rochester has provided vascular surgical consultation, and patient is scheduled for arteriogram on 04/03/12. Certainly, some form of surgical intervention will be needed and definitive management will depend on findings. Active Problems:  1. Cellulitis: See above.  2. ESRD (end stage renal disease): Patient is on HD, M-W-F. He has been seen by the renal team, and re-commenced on scheduled dialysis. Consultation was provided by Dr. Arta Silence.  3. DM2 (diabetes mellitus, type 2): CBGs are still sub-optimal today. Managing with diet, SSI and Lantus, adjusted as indicated.  4. HTN (hypertension): Now controlled. On Amlodipine, Carvedilol and Lasix.    LOS: 3 days   Camree Wigington,[name] 04/02/2012, 12:03 PM

## 2012-04-02 NOTE — Progress Notes (Signed)
Orthopedics Progress Note  Subjective: No complaints this AM.  Objective:  Filed Vitals:   04/02/12 0532  BP: 103/57  Pulse: 64  Temp: 97.8 F (36.6 C)  Resp: 18    General: Awake and alert  Musculoskeletal:5 by 7 CM ulcer with exposed tendon lateral leg and one small ulcer to the lateral heel. Minimal erythema and swelling  No drainage Neurovascularly stable  Lab Results  Component Value Date   WBC 7.6 04/02/2012   HGB 10.5* 04/02/2012   HCT 34.3* 04/02/2012   MCV 94.8 04/02/2012   PLT 269 04/02/2012       Component Value Date/Time   NA 132* 04/02/2012 0653   K 3.5 04/02/2012 0653   CL 93* 04/02/2012 0653   CO2 23 04/02/2012 0653   GLUCOSE 228* 04/02/2012 0653   BUN 46* 04/02/2012 0653   CREATININE 4.45* 04/02/2012 0653   CALCIUM 9.7 04/02/2012 0653   GFRNONAA 12* 04/02/2012 0653   GFRAA 14* 04/02/2012 0653    No results found for this basename: INR, PROTIME    Assessment/Plan: Pressure ulcer lateral leg and heel.  At the bedside with Dr Hart Rochester (vascular surgery) who was consulting regarding blood flow.  Potential angiography tomorrow Dressing changed today.  Dr Victorino Dike following for orthopedics.  Almedia Balls. Ranell Patrick, MD 04/02/2012 8:55 AM
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
## 2012-04-02 NOTE — Progress Notes (Signed)
Subjective:  No current complaints.  Objective: Vital signs in last 24 hours: Temp:  [97.5 F (36.4 C)-98.4 F (36.9 C)] 97.8 F (36.6 C) (04/28 0532) Pulse Rate:  [64-85] 64  (04/28 0532) Resp:  [16-18] 18  (04/28 0532) BP: (80-143)/(45-78) 103/57 mmHg (04/28 0532) SpO2:  [89 %-95 %] 95 % (04/28 0532) Weight:  [76 kg (167 lb 8.8 oz)] 76 kg (167 lb 8.8 oz) (04/27 1940) Weight change: -2.8 kg (-6 lb 2.8 oz)  Intake/Output from previous day: 04/27 0701 - 04/28 0700 In: 1037 [P.O.:834; I.V.:3; IV Piggyback:200] Out: 50 [Urine:50]   EXAM: General appearance:  Alert, in no apparent distress Resp:  CTA bilaterally Cardio:  RRR without murmur GI:  + BS, soft and nontender Extremities:  No edema, dressing over right leg and foot wounds Access:  Right IJ catheter  Lab Results:  Basename 04/01/12 0530 03/31/12 0600  WBC 8.7 8.7  HGB 11.4* 10.9*  HCT 37.9* 36.1*  PLT 295 277   BMET:  Basename 04/01/12 0530 03/31/12 0600  NA 136 134*  K 4.0 5.0  CL 96 96  CO2 23 25  GLUCOSE 223* 147*  BUN 26* 43*  CREATININE 2.93* 4.60*  CALCIUM 9.5 9.4  ALBUMIN 2.4* --   No results found for this basename: PTH:2 in the last 72 hours Iron Studies: No results found for this basename: IRON,TIBC,TRANSFERRIN,FERRITIN in the last 72 hours  Assessment/Plan: 1. Right lower leg and heel ulcers - likely secondary to poorly-controlled DM; began Vancomycin and Primaxin IV, seen by Dr. Victorino Dike, MRI cancelled secondary to defibrillator, lower extremity Dopplers to evaluate vascular status pending. At risk for needing leg amputation per [facility]- pt is aware.  2. ESRD - HD on MWF @ [place], using IJ catheter (brachiocephalic AVF, placed 09/20/11, never matured), K stable at 4 yesterday.  Next HD tomorrow. 3. Hypertension/volume - BP most recently 103/57 on Amlodipine 5 mg qd and Coreg 25 mg bid; wt 76  UF (outpatient EDW 76 kg).  4. Anemia - Hgb 11.4 on outpatient Epogen 6600 U and IV Fe per HD; however,  receiving oral Fe secondary to ferritin > 1300.  5. Metabolic bone disease - Ca 9.5 (10.8 corrected), P 3.1, on Zemplar 1 mcg per HD, Phoslo 2 and Fosrenol 1 g with meals.  6. Nutrition - last Alb 2.4.  7. CAD - s/p CABG in '91 followed by 3 stents; on ASA, Plavix, Coreg and Imdur.  8. DM - on Insulin, Neurontin for diabetic neuropathy; Hgb A1C 9.3 on 4/24..  9. COPD - on inhaler. Urinary retention - with indwelling Foley.     LOS: 3 days   [name],[name] 04/02/2012,7:36 AM  Patient seen and examined and agree with assessment and plan as above with additions in bold.   [name] Moselle  MD [facility] [phone] pgr    [phone] cell 04/02/2012, 1:32 PM

## 2012-04-03 ENCOUNTER — Encounter (HOSPITAL_COMMUNITY)
Admission: AD | Disposition: A | Discharge status: Skilled Nursing Facility | Payer: Self-pay | Source: Other Acute Inpatient Hospital | Attending: Internal Medicine

## 2012-04-03 ENCOUNTER — Inpatient Hospital Stay (HOSPITAL_COMMUNITY): Payer: PRIVATE HEALTH INSURANCE

## 2012-04-03 HISTORY — PX: ABDOMINAL AORTAGRAM: SHX5454

## 2012-04-03 LAB — CBC
Hemoglobin: 10.4 g/dL — ABNORMAL LOW (ref 13.0–17.0)
Hemoglobin: 11.9 g/dL — ABNORMAL LOW (ref 13.0–17.0)
MCH: 29.5 pg (ref 26.0–34.0)
MCV: 93.2 fL (ref 78.0–100.0)
Platelets: 280 10*3/uL (ref 150–400)
RBC: 3.53 MIL/uL — ABNORMAL LOW (ref 4.22–5.81)
RBC: 4.04 MIL/uL — ABNORMAL LOW (ref 4.22–5.81)
WBC: 6.1 10*3/uL (ref 4.0–10.5)

## 2012-04-03 LAB — GLUCOSE, CAPILLARY
Glucose-Capillary: 171 mg/dL — ABNORMAL HIGH (ref 70–99)
Glucose-Capillary: 155 mg/dL — ABNORMAL HIGH (ref 70–99)
Glucose-Capillary: 180 mg/dL — ABNORMAL HIGH (ref 70–99)
Glucose-Capillary: 180 mg/dL — ABNORMAL HIGH (ref 70–99)

## 2012-04-03 LAB — RENAL FUNCTION PANEL
BUN: 58 mg/dL — ABNORMAL HIGH (ref 6–23)
CO2: 20 mEq/L (ref 19–32)
Calcium: 9.8 mg/dL (ref 8.4–10.5)
Chloride: 92 mEq/L — ABNORMAL LOW (ref 96–112)
Creatinine, Ser: 5.27 mg/dL — ABNORMAL HIGH (ref 0.50–1.35)
GFR calc non Af Amer: 10 mL/min — ABNORMAL LOW (ref >90)
Glucose, Bld: 152 mg/dL — ABNORMAL HIGH (ref 70–99)

## 2012-04-03 LAB — CREATININE, SERUM
Creatinine, Ser: 2.62 mg/dL — ABNORMAL HIGH (ref 0.50–1.35)
GFR calc Af Amer: 26 mL/min — ABNORMAL LOW (ref >90)
GFR calc non Af Amer: 22 mL/min — ABNORMAL LOW (ref >90)

## 2012-04-03 SURGERY — ABDOMINAL AORTAGRAM
Anesthesia: LOCAL

## 2012-04-03 MED ORDER — SIMVASTATIN 20 MG PO TABS
20.0000 mg | ORAL_TABLET | Freq: Every day | ORAL | Status: DC
  Administered 2012-04-04 – 2012-04-09 (×5): 20 mg via ORAL
  Filled 2012-04-03 (×7): qty 1

## 2012-04-03 MED ORDER — FINASTERIDE 5 MG PO TABS
5.0000 mg | ORAL_TABLET | Freq: Every day | ORAL | Status: DC
  Administered 2012-04-03 – 2012-04-10 (×7): 5 mg via ORAL
  Filled 2012-04-03 (×8): qty 1

## 2012-04-03 MED ORDER — ASPIRIN EC 81 MG PO TBEC
81.0000 mg | DELAYED_RELEASE_TABLET | Freq: Every day | ORAL | Status: DC
  Administered 2012-04-03 – 2012-04-10 (×7): 81 mg via ORAL
  Filled 2012-04-03 (×8): qty 1

## 2012-04-03 MED ORDER — AMLODIPINE BESYLATE 5 MG PO TABS
5.0000 mg | ORAL_TABLET | Freq: Every day | ORAL | Status: DC
  Administered 2012-04-03: 5 mg via ORAL
  Filled 2012-04-03 (×2): qty 1

## 2012-04-03 MED ORDER — INSULIN ASPART 100 UNIT/ML ~~LOC~~ SOLN
0.0000 [IU] | Freq: Three times a day (TID) | SUBCUTANEOUS | Status: DC
  Administered 2012-04-04: 5 [IU] via SUBCUTANEOUS

## 2012-04-03 MED ORDER — FAMOTIDINE 20 MG PO TABS
20.0000 mg | ORAL_TABLET | Freq: Two times a day (BID) | ORAL | Status: DC
  Administered 2012-04-03 – 2012-04-04 (×2): 20 mg via ORAL
  Filled 2012-04-03 (×3): qty 1

## 2012-04-03 MED ORDER — TRAMADOL HCL 50 MG PO TABS
50.0000 mg | ORAL_TABLET | Freq: Four times a day (QID) | ORAL | Status: DC | PRN
  Administered 2012-04-07 – 2012-04-10 (×6): 50 mg via ORAL
  Filled 2012-04-03 (×6): qty 1

## 2012-04-03 MED ORDER — PANTOPRAZOLE SODIUM 40 MG PO TBEC
40.0000 mg | DELAYED_RELEASE_TABLET | Freq: Every day | ORAL | Status: DC
  Administered 2012-04-03 – 2012-04-09 (×6): 40 mg via ORAL
  Filled 2012-04-03 (×7): qty 1

## 2012-04-03 MED ORDER — LIDOCAINE HCL (PF) 1 % IJ SOLN
INTRAMUSCULAR | Status: AC
  Filled 2012-04-03: qty 30

## 2012-04-03 MED ORDER — AMLODIPINE BESYLATE 5 MG PO TABS: 5.0000 mg | ORAL_TABLET | Freq: Every day | ORAL | Status: DC

## 2012-04-03 MED ORDER — ROPINIROLE HCL 1 MG PO TABS
2.0000 mg | ORAL_TABLET | Freq: Every day | ORAL | Status: DC
  Administered 2012-04-03 – 2012-04-09 (×6): 2 mg via ORAL
  Filled 2012-04-03 (×8): qty 2

## 2012-04-03 MED ORDER — HEPARIN (PORCINE) IN NACL 2-0.9 UNIT/ML-% IJ SOLN
INTRAMUSCULAR | Status: AC
  Filled 2012-04-03: qty 1000

## 2012-04-03 MED ORDER — SODIUM CHLORIDE 0.9 % IV SOLN: 250.0000 mL | INTRAVENOUS | Status: DC | PRN

## 2012-04-03 MED ORDER — VANCOMYCIN HCL 1000 MG IV SOLR
750.0000 mg | INTRAVENOUS | Status: DC
  Administered 2012-04-03 – 2012-04-07 (×3): 750 mg via INTRAVENOUS
  Filled 2012-04-03 (×5): qty 750

## 2012-04-03 MED ORDER — SODIUM CHLORIDE 0.9 % IV SOLN
250.0000 mg | Freq: Two times a day (BID) | INTRAVENOUS | Status: DC
  Administered 2012-04-03 – 2012-04-07 (×8): 250 mg via INTRAVENOUS
  Filled 2012-04-03 (×13): qty 250

## 2012-04-03 MED ORDER — SODIUM CHLORIDE 0.9 % IJ SOLN: 3.0000 mL | INTRAMUSCULAR | Status: DC | PRN

## 2012-04-03 MED ORDER — ISOSORBIDE MONONITRATE ER 60 MG PO TB24
120.0000 mg | ORAL_TABLET | Freq: Every day | ORAL | Status: DC
  Administered 2012-04-03 – 2012-04-09 (×6): 120 mg via ORAL
  Filled 2012-04-03 (×8): qty 2

## 2012-04-03 MED ORDER — ACETAMINOPHEN 325 MG PO TABS
650.0000 mg | ORAL_TABLET | ORAL | Status: DC | PRN
  Administered 2012-04-04 – 2012-04-09 (×7): 650 mg via ORAL
  Filled 2012-04-03 (×7): qty 2

## 2012-04-03 MED ORDER — INSULIN GLARGINE 100 UNIT/ML ~~LOC~~ SOLN
40.0000 [IU] | Freq: Every day | SUBCUTANEOUS | Status: DC
  Administered 2012-04-03 – 2012-04-04 (×2): 40 [IU] via SUBCUTANEOUS

## 2012-04-03 MED ORDER — HEPARIN SODIUM (PORCINE) 5000 UNIT/ML IJ SOLN
5000.0000 [IU] | Freq: Three times a day (TID) | INTRAMUSCULAR | Status: DC
  Administered 2012-04-03 – 2012-04-04 (×4): 5000 [IU] via SUBCUTANEOUS
  Filled 2012-04-03 (×8): qty 1

## 2012-04-03 MED ORDER — INSULIN GLARGINE 100 UNIT/ML ~~LOC~~ SOLN: 35.0000 [IU] | Freq: Every day | SUBCUTANEOUS | Status: DC

## 2012-04-03 MED ORDER — LEVOTHYROXINE SODIUM 75 MCG PO TABS
75.0000 ug | ORAL_TABLET | Freq: Every day | ORAL | Status: DC
  Administered 2012-04-04 – 2012-04-09 (×3): 75 ug via ORAL
  Filled 2012-04-03 (×8): qty 1

## 2012-04-03 MED ORDER — HYDRALAZINE HCL 20 MG/ML IJ SOLN
10.0000 mg | Freq: Four times a day (QID) | INTRAMUSCULAR | Status: DC | PRN
  Filled 2012-04-03: qty 0.5

## 2012-04-03 MED ORDER — FENTANYL CITRATE 0.05 MG/ML IJ SOLN
INTRAMUSCULAR | Status: AC
  Filled 2012-04-03: qty 2

## 2012-04-03 MED ORDER — ONDANSETRON HCL 4 MG/2ML IJ SOLN
4.0000 mg | Freq: Four times a day (QID) | INTRAMUSCULAR | Status: DC | PRN
  Administered 2012-04-06 – 2012-04-08 (×6): 4 mg via INTRAVENOUS
  Filled 2012-04-03 (×6): qty 2

## 2012-04-03 MED ORDER — NEPRO/CARBSTEADY PO LIQD: 237.0000 mL | Freq: Every day | ORAL | Status: DC

## 2012-04-03 MED ORDER — SODIUM CHLORIDE 0.9 % IJ SOLN
3.0000 mL | Freq: Two times a day (BID) | INTRAMUSCULAR | Status: DC
  Administered 2012-04-03 – 2012-04-10 (×12): 3 mL via INTRAVENOUS

## 2012-04-03 MED ORDER — HYDRALAZINE HCL 20 MG/ML IJ SOLN
INTRAMUSCULAR | Status: AC
  Filled 2012-04-03: qty 1

## 2012-04-03 NOTE — Progress Notes (Addendum)
[facility] Progress Note  Subjective:  Thirsty; had BM in bed while sleeping (not normal) Objective Filed Vitals:   04/02/12 1654 04/02/12 2310 04/03/12 0429 04/03/12 0904  BP: 115/65 137/78 147/66 144/75  Pulse: 68 63 63 62  Temp: 98 F (36.7 C) 98.2 F (36.8 C) 97.8 F (36.6 C) 97.9 F (36.6 C)  TempSrc: Oral Oral Oral Oral  Resp: 16 18 18 17   Height:  5\' 7"  (1.702 m)    Weight:  76.93 kg (169 lb 9.6 oz)    SpO2: 93% 94% 93% 91%   Physical Exam General: NAD resting in bed Heart: RRR  Lungs: no wheezes or rales Abdomen: soft NT + BS Extremities: no significant LE edema; right foot wrapped with foul odor Dialysis Access: s/p several failed accesses; using right I-J  Assessment/Plan: 1.  Right lower leg and heel ulcers - On Vancomycin and Primaxin IV, seen by Dr. Victorino Dike, MRI cancelled secondary to defibrillator, for LE arteriogram today; pt is aware of high risk for amputation. Does he have cultures pending? I don't see any in computer. 2. ESRD - HD on MWF @ [place], using IJ catheter (brachiocephalic AVF, placed 09/20/11, never matured), K decreased at 3.6  yesterday. Next HD tomorrow. 3. Hypertension/volume - on Amlodipine 5 mg qd (changed to HS - got am dose today) and Coreg 25 mg bid; wt 76 UF (outpatient EDW 76 kg).  4. Anemia - Hgb trending down on outpatient Epogen 6600 U and IV Fe per HD; however, receiving oral Fe secondary to ferritin ???> 1300. Ferritin likely increased due to inflammation.; d/c po Fe; will evaluate need for IV Fe; Aranesp ordered at 100 q week 5. Metabolic bone disease - Ca 9.5 (10.8 corrected), P 5.5, on Zemplar 1 mcg per HD, Phoslo 2 and Fosrenol 1 g with meals. Needs 2 Ca bath when K is high enough to qualify for 2 K bath; Using 4 K bath for now. 6. Nutrition -  Alb 2.1; needs high protein renal diet when eating and add supplements  7. CAD - s/p CABG in '91 followed by 3 stents; on ASA, Plavix, Coreg and Imdur.  8. DM - on Insulin,  Neurontin for diabetic neuropathy; Hgb A1C 9.3 on 4/24..  9. COPD - on inhaler 10. Urinary retention - with indwelling Foley > 1 year; 500 cc out yesterday; also on bid lasix 80 bid and dutasteride.; high risk for infection  Additional Objective Labs: Basic Metabolic Panel:  Lab 04/03/12 4098 04/02/12 0653 04/01/12 0530  NA 130* 132* 136  K 3.6 3.5 4.0  CL 92* 93* 96  CO2 20 23 23   GLUCOSE 152* 228* 223*  BUN 58* 46* 26*  CREATININE 5.27* 4.45* 2.93*  CALCIUM 9.8 9.7 9.5  ALB -- -- --  PHOS 5.5* 5.0* 3.1   Liver Function Tests:  Lab 04/03/12 0600 04/02/12 0653 04/01/12 0530  AST -- -- --  ALT -- -- --  ALKPHOS -- -- --  BILITOT -- -- --  PROT -- -- --  ALBUMIN 2.1* 2.2* 2.4*   CBC:  Lab 04/03/12 0600 04/02/12 0653 04/01/12 0530 03/31/12 0600 03/30/12 2240  WBC 6.1 7.6 8.7 -- --  NEUTROABS -- -- -- -- --  HGB 10.4* 10.5* 11.4* -- --  HCT 32.9* 34.3* 37.9* -- --  MCV 93.2 94.8 95.9 95.5 95.8  PLT 280 269 295 -- --  CBG:  Lab 04/03/12 0744 04/02/12 2259 04/02/12 1652 04/02/12 1213 04/02/12 0759  GLUCAP 171* 208*  198* 239* 227*  Medications:      . amLODipine  5 mg Oral Daily  . aspirin  81 mg Oral Daily  . calcium acetate  1,334 mg Oral TID WC  . carvedilol  25 mg Oral BID WC  . Chlorhexidine Gluconate Cloth  6 each Topical Q0600  . clopidogrel  75 mg Oral Daily  . collagenase  1 application Topical Daily  . darbepoetin (ARANESP) injection - DIALYSIS  100 mcg Intravenous Q Fri-HD  . dutasteride  0.5 mg Oral Daily  . enoxaparin  30 mg Subcutaneous Q24H  . famotidine  10 mg Oral BID  . ferrous sulfate  325 mg Oral Q breakfast  . fluticasone  1 puff Inhalation BID  . Fluticasone-Salmeterol  1 puff Inhalation Q12H  . furosemide  80 mg Oral BID  . gabapentin  300 mg Oral BID  . imipenem-cilastatin  250 mg Intravenous Q12H  . insulin aspart  0-15 Units Subcutaneous TID WC  . insulin glargine  40 Units Subcutaneous QHS  . isosorbide mononitrate  120 mg Oral  Daily  . lanthanum  1,000 mg Oral TID WC  . levothyroxine  75 mcg Oral QAC breakfast  . multivitamin  1 tablet Oral QHS  . mupirocin ointment  1 application Nasal BID  . olmesartan  10 mg Oral Daily  . pantoprazole  40 mg Oral Q1200  . paricalcitol  1 mcg Intravenous Q M,W,F-HD  . rOPINIRole  2 mg Oral QHS  . simvastatin  5 mg Oral q1800  . sodium chloride  3 mL Intravenous Q12H  . vancomycin  750 mg Intravenous Q M,W,F-HD  . DISCONTD: insulin glargine  35 Units Subcutaneous QHS    I  have reviewed scheduled and prn medications.  Sheffield Slider, PA-C [facility] Beeper [phone]  04/03/2012,10:45 AM  LOS: 4 days

## 2012-04-03 NOTE — Consult Note (Signed)
Wound care follow-up:  Pt now being followed by [facility] for assessment and plan of care. Will not plan to follow further unless re-consulted.  [address] RN, MSN, [employer]  [phone]

## 2012-04-03 NOTE — H&P (Signed)
  [facility]  History and Physical Update  The patient was interviewed and re-examined.  The patient's previous History and Physical has been reviewed and is unchanged from Dr. Candie Chroman consult on 04/02/12.  There is no change in the plan of care: Aortogram, bilateral leg runoff, possible right leg runoff.  Laboratory: CBC:    Component Value Date/Time   WBC 6.1 04/03/2012 0600   RBC 3.53* 04/03/2012 0600   HGB 10.4* 04/03/2012 0600   HCT 32.9* 04/03/2012 0600   PLT 280 04/03/2012 0600   MCV 93.2 04/03/2012 0600   MCH 29.5 04/03/2012 0600   MCHC 31.6 04/03/2012 0600   RDW 15.9* 04/03/2012 0600    BMP:    Component Value Date/Time   NA 130* 04/03/2012 0600   K 3.6 04/03/2012 0600   CL 92* 04/03/2012 0600   CO2 20 04/03/2012 0600   GLUCOSE 152* 04/03/2012 0600   BUN 58* 04/03/2012 0600   CREATININE 5.27* 04/03/2012 0600   CALCIUM 9.8 04/03/2012 0600   GFRNONAA 10* 04/03/2012 0600   GFRAA 11* 04/03/2012 0600    Coagulation: No results found for this basename: INR, PROTIME   No results found for this basename: PTT      Leonides Sake, MD [facility] Office: [phone] Pager: [phone]  04/03/2012, 9:37 AM

## 2012-04-03 NOTE — Progress Notes (Signed)
Pt off unit

## 2012-04-03 NOTE — Progress Notes (Signed)
Clinical Social Work-CSW contacted SNF and left message to notify them of pt d/c status-CSW will follow up in AM and facilitate d/c when appropriate- Jodean Lima, LCSW-for [facility] [phone]

## 2012-04-03 NOTE — Progress Notes (Signed)
Subjective: Pt denies any significant pain in right leg.  Awaiting arteriogram by Dr. Imogene Burn today.  Denies f/c/n/v/wt loss.  Per family members present, pt has had the ulcer for at least 6 weeks.  Objective: Vital signs in last 24 hours: Temp:  [97.1 F (36.2 C)-98.2 F (36.8 C)] 97.1 F (36.2 C) (04/29 1631) Pulse Rate:  [62-72] 69  (04/29 1644) Resp:  [16-18] 16  (04/29 1631) BP: (115-172)/(65-78) 136/77 mmHg (04/29 1644) SpO2:  [91 %-95 %] 93 % (04/29 1621) Weight:  [76.93 kg (169 lb 9.6 oz)-77 kg (169 lb 12.1 oz)] 77 kg (169 lb 12.1 oz) (04/29 1631)  Intake/Output from previous day: 04/28 0701 - 04/29 0700 In: 803 [P.O.:480; I.V.:23; IV Piggyback:300] Out: 500 [Urine:500] Intake/Output this shift: Total I/O In: 0  Out: 800 [Urine:800]   Basename 04/03/12 0600 04/02/12 0653 04/01/12 0530  HGB 10.4* 10.5* 11.4*    Basename 04/03/12 0600 04/02/12 0653  WBC 6.1 7.6  RBC 3.53* 3.62*  HCT 32.9* 34.3*  PLT 280 269    Basename 04/03/12 0600 04/02/12 0653  NA 130* 132*  K 3.6 3.5  CL 92* 93*  CO2 20 23  BUN 58* 46*  CREATININE 5.27* 4.45*  GLUCOSE 152* 228*  CALCIUM 9.8 9.7   No results found for this basename: LABPT:2,INR:2 in the last 72 hours  wounds at right lateral leg and right lateral hindfoot are dressed and dry.  Assessment/Plan: R leg non healing diabetic ulcer, right heel nonhealing diabetic ulcer - pt is scheduled for arteriogram today.  With improved blood flow, pt may be able to heal wounds with debridement, acel and wound vac.  Without improvement in blood flow, it's non likely that he'll be able to heal these wounds, and a BKA is likely to be necessary to get a wound that would heal.  We'll await vascular consultant's findings to plan further treatment options.   Toni Arthurs 04/03/2012, 4:53 PM

## 2012-04-03 NOTE — Progress Notes (Signed)
Subjective: No new issues.  Objective: Vital signs in last 24 hours: Temp:  [97.8 F (36.6 C)-98.2 F (36.8 C)] 97.9 F (36.6 C) (04/29 0904) Pulse Rate:  [62-70] 62  (04/29 0904) Resp:  [16-18] 17  (04/29 0904) BP: (115-147)/(64-78) 144/75 mmHg (04/29 0904) SpO2:  [91 %-94 %] 91 % (04/29 0904) Weight:  [76.93 kg (169 lb 9.6 oz)] 76.93 kg (169 lb 9.6 oz) (04/28 2310) Weight change: 0.93 kg (2 lb 0.8 oz) Last BM Date: 04/02/12  Intake/Output from previous day: 04/28 0701 - 04/29 0700 In: 803 [P.O.:480; I.V.:23; IV Piggyback:300] Out: 500 [Urine:500] Total I/O In: 0  Out: 300 [Urine:300]   Physical Exam: General: Comfortable, alert, communicative, fully oriented, not short of breath at rest.  HEENT:  Mild clinical pallor, no jaundice, no conjunctival injection or discharge. Hydration appears satisfactory. NECK:  Supple, JVP not seen, no carotid bruits, no palpable lymphadenopathy, no palpable goiter. CHEST:  Clinically clear to auscultation, no wheezes, no crackles. HEART:  Sounds 1 and 2 heard, normal, regular, no murmurs. ABDOMEN:  Moderately obese, soft, non-tender, no palpable organomegaly, no palpable masses, normal bowel sounds. GENITALIA:  Not examined. LOWER EXTREMITIES:  No pitting edema, palpable peripheral pulses. Patient has a 6 cm x 4 cm wound on the lateral aspect of his right lower leg, which looks infected, has exposed tendon and is odoriferous. In addition, there is a smaller 1 cm x 2 cm wound on heel of right foot, in similar condition, now under dressings. MUSCULOSKELETAL SYSTEM:  Generalized osteoarthritic changes, otherwise, normal. CENTRAL NERVOUS SYSTEM:  No focal neurologic deficit on gross examination.  Lab Results:  Basename 04/03/12 0600 04/02/12 0653  WBC 6.1 7.6  HGB 10.4* 10.5*  HCT 32.9* 34.3*  PLT 280 269    Basename 04/03/12 0600 04/02/12 0653  NA 130* 132*  K 3.6 3.5  CL 92* 93*  CO2 20 23  GLUCOSE 152* 228*  BUN 58* 46*    CREATININE 5.27* 4.45*  CALCIUM 9.8 9.7   Recent Results (from the past 240 hour(s))  MRSA PCR SCREENING     Status: Abnormal   Collection Time   03/31/12  5:19 AM      Component Value Range Status Comment   MRSA by PCR POSITIVE (*) NEGATIVE  Final      Studies/Results: No results found.  Medications: Scheduled Meds:    . amLODipine  5 mg Oral Daily  . aspirin  81 mg Oral Daily  . calcium acetate  1,334 mg Oral TID WC  . carvedilol  25 mg Oral BID WC  . Chlorhexidine Gluconate Cloth  6 each Topical Q0600  . clopidogrel  75 mg Oral Daily  . collagenase  1 application Topical Daily  . darbepoetin (ARANESP) injection - DIALYSIS  100 mcg Intravenous Q Fri-HD  . dutasteride  0.5 mg Oral Daily  . enoxaparin  30 mg Subcutaneous Q24H  . famotidine  10 mg Oral BID  . fluticasone  1 puff Inhalation BID  . Fluticasone-Salmeterol  1 puff Inhalation Q12H  . furosemide  80 mg Oral BID  . gabapentin  300 mg Oral BID  . imipenem-cilastatin  250 mg Intravenous Q12H  . insulin aspart  0-15 Units Subcutaneous TID WC  . insulin glargine  40 Units Subcutaneous QHS  . isosorbide mononitrate  120 mg Oral Daily  . lanthanum  1,000 mg Oral TID WC  . levothyroxine  75 mcg Oral QAC breakfast  . multivitamin  1 tablet Oral QHS  .  mupirocin ointment  1 application Nasal BID  . olmesartan  10 mg Oral Daily  . pantoprazole  40 mg Oral Q1200  . paricalcitol  1 mcg Intravenous Q M,W,F-HD  . rOPINIRole  2 mg Oral QHS  . simvastatin  5 mg Oral q1800  . sodium chloride  3 mL Intravenous Q12H  . vancomycin  750 mg Intravenous Q M,W,F-HD  . DISCONTD: amLODipine  5 mg Oral Daily  . DISCONTD: ferrous sulfate  325 mg Oral Q breakfast   Continuous Infusions:  PRN Meds:.sodium chloride, acetaminophen, acetaminophen, benzonatate, calcium carbonate (dosed in mg elemental calcium), HYDROcodone-acetaminophen, ondansetron, sodium chloride, zolpidem  Assessment/Plan:  Principal Problem:  *Foot ulcer due to  secondary DM: Patient presented with an obviously infected lage and deep RLE wound, which he says has been present only for 2 weeks, without antecedent trauma. He also has a more chronic right heel wound. He is now on Vancmycin/Primaxin day#5. Dr Victorino Dike provided orthopedic consultation. Unfortunately, MRI was not feasible, due to presence of a pacer. CT scan may be a suitable alternative, but we shall defer to orthopedic recommendations. Dr Victorino Dike feel that evaluation of peripheral arterial circulation is indicated. Dr Hart Rochester has provided vascular surgical consultation, and patient is scheduled for arteriogram on 04/03/12. Certainly, some form of surgical intervention will be needed and definitive management will depend on findings. Active Problems:  1. Cellulitis: See above.  2. ESRD (end stage renal disease): Patient is on HD, M-W-F. He has been seen by the renal team, and re-commenced on scheduled dialysis. Consultation was provided by Dr. Arta Silence.  3. DM2 (diabetes mellitus, type 2): CBGs are much better controlled today, but still sub-optimal. Managing with diet, SSI and Lantus, adjusted as indicated.  4. HTN (hypertension): Patient is on On Amlodipine, Carvedilol, Imdur, Benicar and Lasix, and control is reasonable at this time.    LOS: 4 days   [name],[name] [name] 04/03/2012, 12:51 PM

## 2012-04-03 NOTE — Progress Notes (Addendum)
I have seen and examined this patient and agree with the assessment/plan as outlined above by [facility]. Plan for LE angiogram this PM by [facility] to assess prognosis for right leg ulcer healing- per appearance yesterday, appears that tendons were exposed. Plan for HD today (schedule around angiography)  PATEL,JAY K.,MD 04/03/2012 1:02 PM

## 2012-04-03 NOTE — Op Note (Signed)
OPERATIVE NOTE   PROCEDURE: 1.  Left common femoral artery cannulation under ultrasound guidance 2.  Aortogram 3.  Second order arterial selection 4.  Right leg runoff 5.  Left leg runoff  PRE-OPERATIVE DIAGNOSIS: Right leg dry gangrene  POST-OPERATIVE DIAGNOSIS: same as above   SURGEON: Leonides Sake, MD  ANESTHESIA: conscious sedation  ESTIMATED BLOOD LOSS: 30 cc  CONTRAST: 165 cc  FINDING(S):  Aorta: patent  Superior mesenteric artery: patent Celiac artery: patent   Right Left  RA Occluded Occluded  CIA Patent Patent  EIA Patent Patent  IIA Patent Patent  CFA Patent Patent  SFA Patent Patent  PFA Patent Patent  Pop Patent but two >90% stenoses: one at knee level, one at above-the-knee Patent but >75% stenosis in above-the-knee segment, two >90% stenosis at the knee level  Trif Extensive diseased Extensive disease, Tibioperoneal trunk with 50-75% stenosis  AT Patent but proximal stenosis and distal occlusion, collateral feeds posterior tibial Occludes shortly after takeoff  Pero Occluded Occluded  PT Heavily diseased with multiple long segment stenoses and occlusions, distally reconstitutes from AT Patent, proximal stenosis from tibioperoneal trunk: only runoff vessel   No inline flow to right foot PT is only runoff to left foot  SPECIMEN(S):  none  INDICATIONS:   [name] [name] is a 78 y.o. male who presents with right leg dry gangrene.  The patient presents for: aortogram, bilateral leg runoff.  I discussed with the patient the nature of angiographic procedures, especially the limited patencies of any endovascular intervention.  The patient is aware of that the risks of an angiographic procedure include but are not limited to: bleeding, infection, access site complications, renal failure, embolization, rupture of vessel, dissection, possible need for emergent surgical intervention, possible need for surgical procedures to treat the patient's pathology, and stroke and  death.  The patient is aware of the risks and agrees to proceed.  DESCRIPTION: After full informed consent was obtained from the patient, the patient was brought back to the angiography suite.  The patient was placed supine upon the angiography table and connected to monitoring equipment.  The patient was then given conscious sedation, the amounts of which are documented in the patient's chart.  The patient was prepped and drape in the standard fashion for an angiographic procedure.  At this point, attention was turned to the left groin.  Under ultrasound guidance, the left common femoral artery was cannulated with a micropuncture needle.  The microwire was advanced into the iliac arterial system.  The needle was exchanged for a microsheath, which was loaded into the common femoral artery over the wire.  The microwire was exchanged for a [facility] wire which was advanced into the aorta.  The microsheath was then exchanged for a 5-Fr sheath which was loaded into the common femoral artery.The Omniflush catheter was then loaded over the wire up to the level of L1.  The catheter was connected to the power injector circuit.  After de-airring and de-clotting the circuit, a power injector aortogram was completed.  Due to the hardware present, I had to oblique the abdominal aortogram to even see the aorta.  I then pulled the catheter to just proximal to the aortic bifurcation and did RAO and LAO pelvic injections with the power injector.  The [facility] wire was replaced in the catheter, and using the Avard and Omniflush catheter, the right common iliac artery was selected.  The catheter and wire were advanced into the external iliac artery.  The right leg runoff  was completed in stations as the patient could not hold still.  A right foot lateral projection also was completed.  Based on these images, this right lower leg is not salvaged so a right above-the-knee amputation should be considered.  The wire was replaced in the  catheter to straighten the crook of the catheter and both were removed.  The left sheath was then aspirated: there was no clot.  The sheath was connected to the power injector circuit.  The left leg runoff was completed in stations as the patient could not hold still.  A left foot lateral projection also was completed.  The sheath was aspirated.  No clots were present and the sheath was reloaded with heparinized saline.    COMPLICATIONS: none  CONDITION: stable   Leonides Sake, MD [facility] Office: [phone] Pager: [phone]  04/03/2012, 4:09 PM

## 2012-04-03 NOTE — Procedures (Signed)
I was present at this session.  I have reviewed the session itself and made appropriate changes.  Legaci Tarman K. 4/29/20135:03 PM

## 2012-04-03 NOTE — Progress Notes (Signed)
ANTIBIOTIC CONSULT NOTE - FOLLOW UP  Pharmacy Consult for Vanc + Primaxin Indication: RLE cellulitis + diabetic ulcers  Allergies  Allergen Reactions  . Morphine And Related Other (See Comments)    "makes me hallucinate"  . Penicillins Rash  . Nsaids Other (See Comments)    "don't really know"    Patient Measurements: Height: 5\' 7"  (170.2 cm) Weight: 169 lb 9.6 oz (76.93 kg) IBW/kg (Calculated) : 66.1   Vital Signs: Temp: 97.9 F (36.6 C) (04/29 0904) Temp src: Oral (04/29 0904) BP: 144/75 mmHg (04/29 0904) Pulse Rate: 62  (04/29 0904) Intake/Output from previous day: 04/28 0701 - 04/29 0700 In: 803 [P.O.:480; I.V.:23; IV Piggyback:300] Out: 500 [Urine:500] Intake/Output from this shift: Total I/O In: 0  Out: 300 [Urine:300]  Labs:  [facility] 04/03/12 0600 04/02/12 0653 04/01/12 0530  WBC 6.1 7.6 8.7  HGB 10.4* 10.5* 11.4*  PLT 280 269 295  LABCREA -- -- --  CREATININE 5.27* 4.45* 2.93*   Estimated Creatinine Clearance: 11.3 ml/min (by C-G formula based on Cr of 5.27). No results found for this basename: VANCOTROUGH:2,VANCOPEAK:2,VANCORANDOM:2,GENTTROUGH:2,GENTPEAK:2,GENTRANDOM:2,TOBRATROUGH:2,TOBRAPEAK:2,TOBRARND:2,AMIKACINPEAK:2,AMIKACINTROU:2,AMIKACIN:2, in the last 72 hours   Microbiology: Recent Results (from the past 720 hour(s))  MRSA PCR SCREENING     Status: Abnormal   Collection Time   03/31/12  5:19 AM      Component Value Range Status Comment   MRSA by PCR POSITIVE (*) NEGATIVE  Final     Anti-infectives     Start     Dose/Rate Route Frequency Ordered Stop   03/31/12 1200   vancomycin (VANCOCIN) 750 mg in sodium chloride 0.9 % 150 mL IVPB        750 mg 150 mL/hr over 60 Minutes Intravenous Every M-W-F (Hemodialysis) 03/31/12 1019     03/31/12 0500   imipenem-cilastatin (PRIMAXIN) 500 mg in sodium chloride 0.9 % 100 mL IVPB  Status:  Discontinued        500 mg 200 mL/hr over 30 Minutes Intravenous Every 12 hours 03/30/12 2228 03/30/12 2228   03/31/12 0500   imipenem-cilastatin (PRIMAXIN) 250 mg in sodium chloride 0.9 % 100 mL IVPB        250 mg 200 mL/hr over 30 Minutes Intravenous Every 12 hours 03/30/12 2229     03/31/12 0000   vancomycin (VANCOCIN) 750 mg in sodium chloride 0.9 % 150 mL IVPB        750 mg 150 mL/hr over 60 Minutes Intravenous  Once 03/30/12 2228 03/31/12 0222          Assessment: 78 y.o. M with ESRD on Vancomycin + Primaxin for empiric coverage of RLE cellulitis and diabetic ulcers. The patient receives HD MWF and is scheduled to receive HD today. Doses remain appropriate at this time. Consideration of surgical intervention has been noted.   Goal of Therapy:  Pre-HD Vancomycin level of 15-25 mcg/ml  Plan:  1. Continue Primaxin 250 mg IV every 12 hours 2. Continue Vancomycin 750 mg IV post HD MWF 3. Will continue to follow HD schedule/duration, culture results, LOT, and antibiotic de-escalation plans   Georgina Pillion, PharmD, BCPS Clinical Pharmacist Pager: [phone] 04/03/2012 10:47 AM

## 2012-04-04 LAB — GLUCOSE, CAPILLARY: Glucose-Capillary: 228 mg/dL — ABNORMAL HIGH (ref 70–99)

## 2012-04-04 MED ORDER — ALPRAZOLAM 0.25 MG PO TABS
0.2500 mg | ORAL_TABLET | Freq: Two times a day (BID) | ORAL | Status: DC | PRN
  Administered 2012-04-07 – 2012-04-09 (×2): 0.25 mg via ORAL
  Filled 2012-04-04 (×3): qty 1

## 2012-04-04 MED ORDER — INSULIN ASPART 100 UNIT/ML ~~LOC~~ SOLN
0.0000 [IU] | Freq: Three times a day (TID) | SUBCUTANEOUS | Status: DC
  Administered 2012-04-04: 8 [IU] via SUBCUTANEOUS
  Administered 2012-04-04: 5 [IU] via SUBCUTANEOUS
  Administered 2012-04-05 (×2): 2 [IU] via SUBCUTANEOUS
  Administered 2012-04-06: 8 [IU] via SUBCUTANEOUS
  Administered 2012-04-07: 3 [IU] via SUBCUTANEOUS
  Administered 2012-04-07: 5 [IU] via SUBCUTANEOUS
  Administered 2012-04-08: 2 [IU] via SUBCUTANEOUS
  Administered 2012-04-08 – 2012-04-09 (×4): 3 [IU] via SUBCUTANEOUS
  Administered 2012-04-09: 5 [IU] via SUBCUTANEOUS

## 2012-04-04 MED ORDER — FAMOTIDINE 20 MG PO TABS
20.0000 mg | ORAL_TABLET | Freq: Every day | ORAL | Status: DC
  Administered 2012-04-05 – 2012-04-10 (×5): 20 mg via ORAL
  Filled 2012-04-04 (×6): qty 1

## 2012-04-04 MED ORDER — NEPRO/CARBSTEADY PO LIQD
237.0000 mL | Freq: Three times a day (TID) | ORAL | Status: DC
  Administered 2012-04-04 – 2012-04-10 (×11): 237 mL via ORAL

## 2012-04-04 MED ORDER — RENA-VITE PO TABS
1.0000 | ORAL_TABLET | Freq: Every day | ORAL | Status: DC
  Administered 2012-04-04 – 2012-04-09 (×5): 1 via ORAL
  Filled 2012-04-04 (×8): qty 1

## 2012-04-04 MED ORDER — INSULIN ASPART 100 UNIT/ML ~~LOC~~ SOLN
0.0000 [IU] | Freq: Every day | SUBCUTANEOUS | Status: DC
  Administered 2012-04-04: 2 [IU] via SUBCUTANEOUS
  Administered 2012-04-06: 3 [IU] via SUBCUTANEOUS

## 2012-04-04 NOTE — Progress Notes (Signed)
[facility] Progress Note  Subjective: Appetite good. Breathing ok. Some pain in right foot. No problems with HD yesterday pm.  Objective Filed Vitals:   04/03/12 2026 04/03/12 2128 04/04/12 0535 04/04/12 0816  BP: 124/65 103/65 82/50 98/60   Pulse: 84 80 77   Temp: 97.3 F (36.3 C) 97.4 F (36.3 C) 98.5 F (36.9 C)   TempSrc: Oral Oral Oral   Resp: 20 20 20    Height:  5\' 7"  (1.702 m)    Weight: 74.9 kg (165 lb 2 oz) 74.9 kg (165 lb 2 oz)    SpO2: 93% 94% 91%    Physical Exam: General: NAD resting in bed; spirits good  Heart: RRR  Lungs: no wheezes or rales  Abdomen: soft NT + BS  Extremities: no significant LE edema; right foot wrapped  Dialysis Access: s/p several failed accesses; using right I-J   Dialysis Orders: Center: [place] on MWF .  EDW 76 kg HD Bath 2K/2.25Ca Time 3.5 hrs Heparin 2000 U. Access CVC @ R IJ BFR 400 DFR 800  Zemplar 1 mcg IV/HD Epogen 6600 Units IV/HD Venofer 100 mg per HD   Assessment/Plan: 1. Right lower leg and heel ulcers - Arteriogram shows need for prob AKA;  on Vancomycin and Primaxin IV, seen by Dr. Victorino Dike, MRI cancelled secondary to defibrillator, for LE arteriogram Monday ; pt is aware of high risk for amputation. Does he have cultures pending? I don't see any in computer. Family wishes Dr. Imogene Burn to tell him results of study and need for surgery. 2. ESRD - HD on MWF @ [facility], using IJ catheter (brachiocephalic AVF, placed 09/20/11, never matured), K decreased at 3.6 yesterday. Next HD Wednesday - no heparin if surgery. Check labs in am 3. Hypertension/volume - BP on the low side; on Amlodipine 5 mg qd (changed to HS - got am dose today) and Coreg 25 mg bid; (outpatient EDW 76 kg).; Post HD wt Monday was 74.9 with net UF of 2 liters 4. Anemia - Hgb trending down on outpatient Epogen 6600 U and IV Fe per HD; however, receiving oral Fe secondary to ferritin ???> 1300. Ferritin likely increased due to inflammation.; d/c po Fe; will  evaluate need for IV Fe; Aranesp ordered at 100 q week. Hgb was 11.9 yesterday, but would expect to drop with surgery. 5. Metabolic bone disease - Ca 9.8 (11.3 corrected), P 5.5 Zemplar d/c; Phoslo 2 and Fosrenol 1 g with meals. Needs 2 Ca bath when K is high enough to qualify for 2 K bath; Using 4 K bath for now. 6. Nutrition - Alb 2.1; changed to high protein renal diet when eating and add supplements  7. CAD - s/p CABG in '91 followed by 3 stents; on ASA, Plavix, Coreg and Imdur.  8. DM - on Insulin, Neurontin for diabetic neuropathy; Hgb A1C 9.3 on 4/24..  9. COPD - on inhaler 10. Urinary retention - with indwelling Foley > 1 year; 1000 cc out yesterday; also on bid lasix 80 bid and proscar.; high risk for infection  Additional Objective Labs: Basic Metabolic Panel:  Lab 04/03/12 4098 04/03/12 0600 04/02/12 0653 04/01/12 0530  NA -- 130* 132* 136  K -- 3.6 3.5 4.0  CL -- 92* 93* 96  CO2 -- 20 23 23   GLUCOSE -- 152* 228* 223*  BUN -- 58* 46* 26*  CREATININE 2.62* 5.27* 4.45* --  CALCIUM -- 9.8 9.7 9.5  ALB -- -- -- --  PHOS -- 5.5* 5.0* 3.1  Liver Function Tests:  Lab 04/03/12 0600 04/02/12 0653 04/01/12 0530  AST -- -- --  ALT -- -- --  ALKPHOS -- -- --  BILITOT -- -- --  PROT -- -- --  ALBUMIN 2.1* 2.2* 2.4*  CBC:  Lab 04/03/12 1700 04/03/12 0600 04/02/12 0653 04/01/12 0530 03/31/12 0600  WBC 9.0 6.1 7.6 -- --  NEUTROABS -- -- -- -- --  HGB 11.9* 10.4* 10.5* -- --  HCT 36.3* 32.9* 34.3* -- --  MCV 89.9 93.2 94.8 95.9 95.5  PLT 298 280 269 -- --  CBG:  Lab 04/04/12 0743 04/03/12 2126 04/03/12 1619 04/03/12 1323 04/03/12 1129  GLUCAP 271* 180* 180* 161* 155*  Medications:      . aspirin EC  81 mg Oral Daily  . famotidine  20 mg Oral BID  . feeding supplement (NEPRO CARB STEADY)  237 mL Oral QHS  . fentaNYL      . finasteride  5 mg Oral Daily  . heparin      . heparin  5,000 Units Subcutaneous Q8H  . hydrALAZINE      . imipenem-cilastatin  250 mg  Intravenous Q12H  . insulin aspart  0-15 Units Subcutaneous TID WC  . insulin aspart  0-5 Units Subcutaneous QHS  . insulin glargine  40 Units Subcutaneous QHS  . isosorbide mononitrate  120 mg Oral QHS  . levothyroxine  75 mcg Oral Q0600  . lidocaine      . pantoprazole  40 mg Oral QHS  . rOPINIRole  2 mg Oral QHS  . simvastatin  20 mg Oral q1800  . sodium chloride  3 mL Intravenous Q12H  . vancomycin  750 mg Intravenous Q M,W,F-HD    I  have reviewed scheduled and prn medications.  Sheffield Slider, PA-C [facility] Beeper [phone]  04/04/2012,9:47 AM  LOS: 5 days

## 2012-04-04 NOTE — Progress Notes (Signed)
Subjective: No new complaints.  Objective: Vital signs in last 24 hours: Temp:  [97.1 F (36.2 C)-98.5 F (36.9 C)] 98.5 F (36.9 C) (04/30 0535) Pulse Rate:  [68-100] 77  (04/30 0535) Resp:  [16-20] 20  (04/30 0535) BP: (82-172)/(50-78) 98/60 mmHg (04/30 0816) SpO2:  [91 %-95 %] 91 % (04/30 0535) Weight:  [74.9 kg (165 lb 2 oz)-77 kg (169 lb 12.1 oz)] 74.9 kg (165 lb 2 oz) (04/29 2128) Weight change: 0.07 kg (2.5 oz) Last BM Date: 04/02/12  Intake/Output from previous day: 04/29 0701 - 04/30 0700 In: 100 [IV Piggyback:100] Out: 3001 [Urine:1000]     Physical Exam: General: Comfortable, alert, communicative, fully oriented, not short of breath at rest.  HEENT:  Mild clinical pallor, no jaundice, no conjunctival injection or discharge. Hydration appears satisfactory. NECK:  Supple, JVP not seen, no carotid bruits, no palpable lymphadenopathy, no palpable goiter. CHEST:  Clinically clear to auscultation, no wheezes, no crackles. HEART:  Sounds 1 and 2 heard, normal, regular, no murmurs. ABDOMEN:  Moderately obese, soft, non-tender, no palpable organomegaly, no palpable masses, normal bowel sounds. GENITALIA:  Not examined. LOWER EXTREMITIES:  No pitting edema, palpable peripheral pulses. Patient has a 6 cm x 4 cm wound on the lateral aspect of his right lower leg, which looks infected, has exposed tendon and is odoriferous. In addition, there is a smaller 1 cm x 2 cm wound on heel of right foot, in similar condition, now under dressings. MUSCULOSKELETAL SYSTEM:  Generalized osteoarthritic changes, otherwise, normal. CENTRAL NERVOUS SYSTEM:  No focal neurologic deficit on gross examination.  Lab Results:  Basename 04/03/12 1700 04/03/12 0600  WBC 9.0 6.1  HGB 11.9* 10.4*  HCT 36.3* 32.9*  PLT 298 280    Basename 04/03/12 2230 04/03/12 0600 04/02/12 0653  NA -- 130* 132*  K -- 3.6 3.5  CL -- 92* 93*  CO2 -- 20 23  GLUCOSE -- 152* 228*  BUN -- 58* 46*  CREATININE 2.62*  5.27* --  CALCIUM -- 9.8 9.7   Recent Results (from the past 240 hour(s))  MRSA PCR SCREENING     Status: Abnormal   Collection Time   03/31/12  5:19 AM      Component Value Range Status Comment   MRSA by PCR POSITIVE (*) NEGATIVE  Final      Studies/Results: No results found.  Medications: Scheduled Meds:    . aspirin EC  81 mg Oral Daily  . famotidine  20 mg Oral BID  . feeding supplement (NEPRO CARB STEADY)  237 mL Oral QHS  . fentaNYL      . finasteride  5 mg Oral Daily  . heparin      . heparin  5,000 Units Subcutaneous Q8H  . hydrALAZINE      . imipenem-cilastatin  250 mg Intravenous Q12H  . insulin aspart  0-15 Units Subcutaneous TID WC  . insulin aspart  0-5 Units Subcutaneous QHS  . insulin glargine  40 Units Subcutaneous QHS  . isosorbide mononitrate  120 mg Oral QHS  . levothyroxine  75 mcg Oral Q0600  . lidocaine      . pantoprazole  40 mg Oral QHS  . rOPINIRole  2 mg Oral QHS  . simvastatin  20 mg Oral q1800  . sodium chloride  3 mL Intravenous Q12H  . vancomycin  750 mg Intravenous Q M,W,F-HD  . DISCONTD: amLODipine  5 mg Oral Daily  . DISCONTD: amLODipine  5 mg Oral Daily  .  DISCONTD: amLODipine  5 mg Oral Daily  . DISCONTD: aspirin  81 mg Oral Daily  . DISCONTD: calcium acetate  1,334 mg Oral TID WC  . DISCONTD: carvedilol  25 mg Oral BID WC  . DISCONTD: Chlorhexidine Gluconate Cloth  6 each Topical Q0600  . DISCONTD: clopidogrel  75 mg Oral Daily  . DISCONTD: collagenase  1 application Topical Daily  . DISCONTD: darbepoetin (ARANESP) injection - DIALYSIS  100 mcg Intravenous Q Fri-HD  . DISCONTD: dutasteride  0.5 mg Oral Daily  . DISCONTD: enoxaparin  30 mg Subcutaneous Q24H  . DISCONTD: famotidine  10 mg Oral BID  . DISCONTD: ferrous sulfate  325 mg Oral Q breakfast  . DISCONTD: fluticasone  1 puff Inhalation BID  . DISCONTD: Fluticasone-Salmeterol  1 puff Inhalation Q12H  . DISCONTD: furosemide  80 mg Oral BID  . DISCONTD: gabapentin  300 mg  Oral BID  . DISCONTD: imipenem-cilastatin  250 mg Intravenous Q12H  . DISCONTD: insulin aspart  0-15 Units Subcutaneous TID WC  . DISCONTD: insulin aspart  0-9 Units Subcutaneous TID WC  . DISCONTD: insulin glargine  35 Units Subcutaneous QHS  . DISCONTD: insulin glargine  40 Units Subcutaneous QHS  . DISCONTD: isosorbide mononitrate  120 mg Oral Daily  . DISCONTD: lanthanum  1,000 mg Oral TID WC  . DISCONTD: levothyroxine  75 mcg Oral QAC breakfast  . DISCONTD: multivitamin  1 tablet Oral QHS  . DISCONTD: mupirocin ointment  1 application Nasal BID  . DISCONTD: olmesartan  10 mg Oral Daily  . DISCONTD: pantoprazole  40 mg Oral Q1200  . DISCONTD: paricalcitol  1 mcg Intravenous Q M,W,F-HD  . DISCONTD: rOPINIRole  2 mg Oral QHS  . DISCONTD: simvastatin  5 mg Oral q1800  . DISCONTD: sodium chloride  3 mL Intravenous Q12H  . DISCONTD: vancomycin  750 mg Intravenous Q M,W,F-HD   Continuous Infusions:  PRN Meds:.sodium chloride, acetaminophen, hydrALAZINE, ondansetron (ZOFRAN) IV, sodium chloride, traMADol, DISCONTD: sodium chloride, DISCONTD: acetaminophen, DISCONTD: acetaminophen, DISCONTD: benzonatate, DISCONTD: calcium carbonate (dosed in mg elemental calcium), DISCONTD: HYDROcodone-acetaminophen, DISCONTD: ondansetron, DISCONTD: sodium chloride, DISCONTD: zolpidem  Assessment/Plan:  Principal Problem:  *Foot ulcer due to secondary DM: Patient presented with an obviously infected lage and deep RLE wound, which he says has been present only for 2 weeks, without antecedent trauma. He also has a more chronic right heel wound. He is now on Vancmycin/Primaxin day#5. Dr Victorino Dike provided orthopedic consultation. Unfortunately, MRI was not feasible, due to presence of a pacer. CT scan may be a suitable alternative, but we shall defer to orthopedic recommendations. Dr Victorino Dike feel that evaluation of peripheral arterial circulation is indicated. Dr Hart Rochester has provided vascular surgical consultation, and  patient underwent arteriogram on 04/03/12. Per Dr Candie Chroman operative notes, it appears that this right lower leg is not salvageable so a right above-the-knee amputation should be considered.  Active Problems:  1. Cellulitis: See above.  2. ESRD (end stage renal disease): Patient is on HD, M-W-F. He has been seen by the renal team, and re-commenced on scheduled dialysis. Consultation was provided by Dr. Arta Silence.  3. DM2 (diabetes mellitus, type 2): CBGs are better controlled today, but still sub-optimal. Managing with diet, SSI and Lantus, adjusted as indicated.  4. HTN (hypertension): Patient is on On Amlodipine, Carvedilol, Imdur, Benicar and Lasix. BP is borderline low today, so Norvasc, Coreg, Benicar and Lasix  have been discontinued.    LOS: 5 days   [name] [name],[name] 04/04/2012, 9:40 AM

## 2012-04-04 NOTE — Progress Notes (Signed)
Subjective: No c/o.  Denies pain in his leg.  No f/c/n/v.  Objective: Vital signs in last 24 hours: Temp:  [97.3 F (36.3 C)-98.5 F (36.9 C)] 98.1 F (36.7 C) (04/30 1331) Pulse Rate:  [70-100] 82  (04/30 1331) Resp:  [16-20] 18  (04/30 1331) BP: (82-144)/(50-75) 110/66 mmHg (04/30 1331) SpO2:  [91 %-94 %] 94 % (04/30 1331) Weight:  [74.9 kg (165 lb 2 oz)] 74.9 kg (165 lb 2 oz) (04/29 2128)  Intake/Output from previous day: 04/29 0701 - 04/30 0700 In: 100 [IV Piggyback:100] Out: 3001 [Urine:1000] Intake/Output this shift: Total I/O In: 120 [P.O.:120] Out: 0    Basename 04/03/12 1700 04/03/12 0600 04/02/12 0653  HGB 11.9* 10.4* 10.5*    Basename 04/03/12 1700 04/03/12 0600  WBC 9.0 6.1  RBC 4.04* 3.53*  HCT 36.3* 32.9*  PLT 298 280    Basename 04/03/12 2230 04/03/12 0600 04/02/12 0653  NA -- 130* 132*  K -- 3.6 3.5  CL -- 92* 93*  CO2 -- 20 23  BUN -- 58* 46*  CREATININE 2.62* 5.27* --  GLUCOSE -- 152* 228*  CALCIUM -- 9.8 9.7   No results found for this basename: LABPT:2,INR:2 in the last 72 hours  PE:  Wounds dressed and dry.  Assessment/Plan: R leg and heel nonhealing ulcers in the face of PAD.  Per Dr. Hart Rochester, pt may be a candidate for angioplasty to improve the blood flow to his leg.  I'll post him for I and D of his leg wound Thursday late morning with application of ACel.   Toni Arthurs 04/04/2012, 4:49 PM

## 2012-04-04 NOTE — Progress Notes (Signed)
I have seen and examined this patient and agree with the assessment/plan as outlined above by [facility] PA. Await decision on the definitive management of Mr.Ozawa's Right leg ulcer- appears will likely need AKA. Plan for HD tomorrow. Kizzi Overbey K.,MD 04/04/2012 2:33 PM

## 2012-04-04 NOTE — Progress Notes (Signed)
Nursing Note  Pt noted to have a BP 82/50, pt asymptomatic. MD on call notified. Will monitor. C.Arslan Kier, RN.

## 2012-04-05 ENCOUNTER — Inpatient Hospital Stay (HOSPITAL_COMMUNITY): Payer: PRIVATE HEALTH INSURANCE

## 2012-04-05 ENCOUNTER — Encounter: Payer: Self-pay | Admitting: Vascular Surgery

## 2012-04-05 DIAGNOSIS — E119 Type 2 diabetes mellitus without complications: Secondary | ICD-10-CM

## 2012-04-05 DIAGNOSIS — I70269 Atherosclerosis of native arteries of extremities with gangrene, unspecified extremity: Secondary | ICD-10-CM

## 2012-04-05 DIAGNOSIS — I10 Essential (primary) hypertension: Secondary | ICD-10-CM

## 2012-04-05 DIAGNOSIS — L97509 Non-pressure chronic ulcer of other part of unspecified foot with unspecified severity: Secondary | ICD-10-CM

## 2012-04-05 DIAGNOSIS — N186 End stage renal disease: Secondary | ICD-10-CM

## 2012-04-05 LAB — CBC
HCT: 32.2 % — ABNORMAL LOW (ref 39.0–52.0)
MCH: 29.4 pg (ref 26.0–34.0)
MCHC: 31.7 g/dL (ref 30.0–36.0)
MCV: 92.8 fL (ref 78.0–100.0)
RDW: 16.3 % — ABNORMAL HIGH (ref 11.5–15.5)
WBC: 7.4 10*3/uL (ref 4.0–10.5)

## 2012-04-05 LAB — RENAL FUNCTION PANEL
Albumin: 2.2 g/dL — ABNORMAL LOW (ref 3.5–5.2)
BUN: 45 mg/dL — ABNORMAL HIGH (ref 6–23)
Calcium: 9.1 mg/dL (ref 8.4–10.5)
Chloride: 93 mEq/L — ABNORMAL LOW (ref 96–112)
Creatinine, Ser: 4.95 mg/dL — ABNORMAL HIGH (ref 0.50–1.35)

## 2012-04-05 LAB — GLUCOSE, CAPILLARY
Glucose-Capillary: 150 mg/dL — ABNORMAL HIGH (ref 70–99)
Glucose-Capillary: 127 mg/dL — ABNORMAL HIGH (ref 70–99)

## 2012-04-05 LAB — BASIC METABOLIC PANEL
BUN: 12 mg/dL (ref 6–23)
CO2: 25 mEq/L (ref 19–32)
Chloride: 98 mEq/L (ref 96–112)
Creatinine, Ser: 2.1 mg/dL — ABNORMAL HIGH (ref 0.50–1.35)
Potassium: 3.9 mEq/L (ref 3.5–5.1)

## 2012-04-05 MED ORDER — HYDROCORTISONE 0.5 % EX CREA
TOPICAL_CREAM | Freq: Two times a day (BID) | CUTANEOUS | Status: DC | PRN
  Administered 2012-04-05: 23:00:00 via TOPICAL
  Filled 2012-04-05: qty 28.35

## 2012-04-05 MED ORDER — FENTANYL CITRATE 0.05 MG/ML IJ SOLN: 50.0000 ug | INTRAMUSCULAR | Status: DC | PRN

## 2012-04-05 MED ORDER — SODIUM CHLORIDE 0.9 % IV SOLN
INTRAVENOUS | Status: DC
  Administered 2012-04-06: 11:00:00 via INTRAVENOUS

## 2012-04-05 MED ORDER — CARVEDILOL 25 MG PO TABS
25.0000 mg | ORAL_TABLET | Freq: Two times a day (BID) | ORAL | Status: DC
  Administered 2012-04-05 – 2012-04-07 (×2): 25 mg via ORAL
  Filled 2012-04-05 (×8): qty 1

## 2012-04-05 MED ORDER — MIDAZOLAM HCL 2 MG/2ML IJ SOLN: 1.0000 mg | INTRAMUSCULAR | Status: DC | PRN

## 2012-04-05 MED ORDER — CEFAZOLIN SODIUM 1-5 GM-% IV SOLN
1.0000 g | INTRAVENOUS | Status: DC
  Filled 2012-04-05: qty 50

## 2012-04-05 MED ORDER — INSULIN GLARGINE 100 UNIT/ML ~~LOC~~ SOLN
40.0000 [IU] | Freq: Every day | SUBCUTANEOUS | Status: DC
  Administered 2012-04-06 – 2012-04-09 (×4): 40 [IU] via SUBCUTANEOUS

## 2012-04-05 MED ORDER — CHLORHEXIDINE GLUCONATE 4 % EX LIQD
60.0000 mL | Freq: Once | CUTANEOUS | Status: AC
  Administered 2012-04-05: 4 via TOPICAL
  Filled 2012-04-05: qty 60

## 2012-04-05 NOTE — Progress Notes (Signed)
[facility]  Daily Progress Note  Assessment/Planning: POD #2 s/p Aortogram, B leg runoff, BLE PAD, R leg dry gangrene   After discussion with Dr. Hart Rochester, I offered the patient an orbital atherectomy and angioplasty of right femoropopliteal stenosis.  The patient declined and would like to proceed with R AKA  Pt was already tenatively scheduled for a R leg debridement on Thursday with Dr. Victorino Dike.   Will defer to Dr. Victorino Dike the R AKA.  Subjective  - 2 Days Post-Op  No complaints  Objective Filed Vitals:   04/04/12 1331 04/04/12 1652 04/04/12 2207 04/05/12 0537  BP: 110/66 157/67 144/73 124/61  Pulse: 82 70 69 67  Temp: 98.1 F (36.7 C) 98.4 F (36.9 C) 98.5 F (36.9 C) 97.6 F (36.4 C)  TempSrc: Oral Oral Oral Oral  Resp: 18 18 18 17   Height:   5\' 7"  (1.702 m)   Weight:   167 lb 8.8 oz (76 kg)   SpO2: 94% 94% 93% 95%    Intake/Output Summary (Last 24 hours) at 04/05/12 0909 Last data filed at 04/04/12 2215  Gross per 24 hour  Intake    240 ml  Output     25 ml  Net    215 ml    PULM  CTAB CV  RRR GI  soft, NTND VASC  L groin without significant hematoma  Laboratory CBC    Component Value Date/Time   WBC 9.0 04/03/2012 1700   HGB 11.9* 04/03/2012 1700   HCT 36.3* 04/03/2012 1700   PLT 298 04/03/2012 1700    BMET    Component Value Date/Time   NA 130* 04/03/2012 0600   K 3.6 04/03/2012 0600   CL 92* 04/03/2012 0600   CO2 20 04/03/2012 0600   GLUCOSE 152* 04/03/2012 0600   BUN 58* 04/03/2012 0600   CREATININE 2.62* 04/03/2012 2230   CALCIUM 9.8 04/03/2012 0600   GFRNONAA 22* 04/03/2012 2230   GFRAA 26* 04/03/2012 2230    Leonides Sake, MD [facility] Office: [phone] Pager: [phone]  04/05/2012, 9:09 AM

## 2012-04-05 NOTE — Progress Notes (Signed)
Attempted visit.  Pt was not in room.  Will continue to follow. Please page me if I am needed or requested. Dellie Catholic  [phone] personal pager

## 2012-04-05 NOTE — Progress Notes (Signed)
Subjective: Pt denies any pain in the right leg.  Seen today in dialysis.  No f/c/n/v.  Pt spoke with Dr. Imogene Burn this morning about the treatment options about his right leg non-healing diabetic ulcers and PAD.   Objective: Vital signs in last 24 hours: Temp:  [97.6 F (36.4 C)-98.5 F (36.9 C)] 97.6 F (36.4 C) (05/01 1500) Pulse Rate:  [67-77] 76  (05/01 1500) Resp:  [17-20] 20  (05/01 1500) BP: (123-174)/(61-91) 172/79 mmHg (05/01 1500) SpO2:  [93 %-99 %] 99 % (05/01 1500) Weight:  [76 kg (167 lb 8.8 oz)-78.5 kg (173 lb 1 oz)] 76.5 kg (168 lb 10.4 oz) (05/01 1500)  Intake/Output from previous day: 04/30 0701 - 05/01 0700 In: 240 [P.O.:240] Out: 25 [Urine:25] Intake/Output this shift: Total I/O In: 0  Out: 2000 [Other:2000]   Basename 04/05/12 1205 04/03/12 1700 04/03/12 0600  HGB 10.2* 11.9* 10.4*    Basename 04/05/12 1205 04/03/12 1700  WBC 7.4 9.0  RBC 3.47* 4.04*  HCT 32.2* 36.3*  PLT 302 298    Basename 04/05/12 1207 04/03/12 2230 04/03/12 0600  NA 130* -- 130*  K 3.5 -- 3.6  CL 93* -- 92*  CO2 24 -- 20  BUN 45* -- 58*  CREATININE 4.95* 2.62* --  GLUCOSE 153* -- 152*  CALCIUM 9.1 -- 9.8   No results found for this basename: LABPT:2,INR:2 in the last 72 hours  wn wd male in nad.  A and O x 4.  Mood and affect normal.  R leg wound dressed and dry.  Assessment/Plan: R leg and heel non-healing diabetic ulcers - Pt tells me that he has declined treatment of his vascular disease and prefers to proceed with right above knee amputation.  Per Drs. Hart Rochester and Imogene Burn, the more proximal amputation site of an AKA has better blood flow and is more likely to heal than a BKA.  I believe that the healing potential of the wounds on the leg, without improved blood flow, is minimal.  As a result, I believe I and D of the wounds and wound care is unlikely to result in healed leg wounds and will likely just get infected again.  I agree with Drs. Hart Rochester and Imogene Burn that a BK amputation is  unlikely to heal as well.  Given the patient's multiple medical co-morbidities, an AKA has the highest likelihood to heal post operatively.  The risks and benefits of the alternative treatment options have been discussed in detail.  The patient wishes to proceed with surgery and specifically understands risks of bleeding, infection, nerve damage, blood clots, need for additional surgery, amputation and death.  We'll proceed with AKA on the right tomorrow.    Toni Arthurs 04/05/2012, 5:07 PM

## 2012-04-05 NOTE — Progress Notes (Signed)
Subjective: Status post dialysis, he denies any complaints. he is anxious to "get it over with AKA in the morning" chart reviewed.  Objective: Vital signs in last 24 hours: Temp:  [97.6 F (36.4 C)-98.5 F (36.9 C)] 97.6 F (36.4 C) (05/01 1500) Pulse Rate:  [67-77] 76  (05/01 1500) Resp:  [17-20] 20  (05/01 1500) BP: (123-174)/(61-91) 172/79 mmHg (05/01 1500) SpO2:  [93 %-99 %] 99 % (05/01 1500) Weight:  [76 kg (167 lb 8.8 oz)-78.5 kg (173 lb 1 oz)] 76.5 kg (168 lb 10.4 oz) (05/01 1500) Weight change: -1 kg (-2 lb 3.3 oz) Last BM Date: 04/05/12  Intake/Output from previous day: 04/30 0701 - 05/01 0700 In: 240 [P.O.:240] Out: 25 [Urine:25] Total I/O In: 0  Out: 2000 [Other:2000]   Physical Exam: General: Alert and oriented x3 not short of breath at rest.  HEENT:  Sclera anicteric, no conjunctival injection or discharge. NECK:  Supple, JVP not seen, no carotid bruits, no palpable lymphadenopathy, no palpable goiter. CHEST:  Clinically clear to auscultation, no wheezes, no crackles. HEART:  normal S1-S2, regular, no murmurs. ABDOMEN:  Moderately obese, soft, non-tender, no palpable organomegaly, no palpable masses, normal bowel sounds. GENITALIA:  Not examined. LOWER EXTREMITIES:  No pitting edema, palpable peripheral pulses. Right lower extremity with dressing clean and dry. CENTRAL NERVOUS SYSTEM:  No focal neurologic deficit on gross examination.  Lab Results:  Basename 04/05/12 1205 04/03/12 1700  WBC 7.4 9.0  HGB 10.2* 11.9*  HCT 32.2* 36.3*  PLT 302 298    Basename 04/05/12 1207 04/03/12 2230 04/03/12 0600  NA 130* -- 130*  K 3.5 -- 3.6  CL 93* -- 92*  CO2 24 -- 20  GLUCOSE 153* -- 152*  BUN 45* -- 58*  CREATININE 4.95* 2.62* --  CALCIUM 9.1 -- 9.8   Recent Results (from the past 240 hour(s))  MRSA PCR SCREENING     Status: Abnormal   Collection Time   03/31/12  5:19 AM      Component Value Range Status Comment   MRSA by PCR POSITIVE (*) NEGATIVE  Final       Studies/Results: No results found.  Medications: Scheduled Meds:    . aspirin EC  81 mg Oral Daily  . famotidine  20 mg Oral Daily  . feeding supplement (NEPRO CARB STEADY)  237 mL Oral TID WC  . finasteride  5 mg Oral Daily  . heparin  5,000 Units Subcutaneous Q8H  . imipenem-cilastatin  250 mg Intravenous Q12H  . insulin aspart  0-15 Units Subcutaneous TID WC  . insulin aspart  0-5 Units Subcutaneous QHS  . insulin glargine  40 Units Subcutaneous QHS  . isosorbide mononitrate  120 mg Oral QHS  . levothyroxine  75 mcg Oral Q0600  . multivitamin  1 tablet Oral Daily  . pantoprazole  40 mg Oral QHS  . rOPINIRole  2 mg Oral QHS  . simvastatin  20 mg Oral q1800  . sodium chloride  3 mL Intravenous Q12H  . vancomycin  750 mg Intravenous Q M,W,F-HD   Continuous Infusions:  PRN Meds:.sodium chloride, acetaminophen, ALPRAZolam, fentaNYL, hydrALAZINE, hydrocortisone cream, midazolam, ondansetron (ZOFRAN) IV, sodium chloride, traMADol  Assessment/Plan:  Principal Problem:  *Foot ulcer due to secondary DM: Patient presented with an obviously infected lage and deep RLE wound, which he says has been present only for 2 weeks, without antecedent trauma. He also has a more chronic right heel wound. He is now on Vancmycin/Primaxin day#5. Dr Victorino Dike provided  orthopedic consultation. Unfortunately, MRI was not feasible, due to presence of a pacer. CT scan may be a suitable alternative, but we shall defer to orthopedic recommendations. Dr Victorino Dike feel that evaluation of peripheral arterial circulation is indicated. Dr Hart Rochester has provided vascular surgical consultation, and patient underwent arteriogram on 04/03/12.  -AKA planned in am Active Problems:  1. Cellulitis: See above.  2. ESRD (end stage renal disease): Patient is on HD, M-W-F. He has been seen by the renal team, and re-commenced on scheduled dialysis. Consultation was provided by Dr. Arta Silence.  3. DM2 (diabetes mellitus, type 2): CBGs  are better controlled today, but still sub-optimal. Managing with diet, SSI and Lantus, adjusted as indicated.  4. HTN (hypertension):  BP stable/elevated off  Norvasc, Coreg, Benicar and Lasix discontinued on 4:30 secondary to hypotension. -Will resume Coreg only for now(with hold parameters) and continue monitoring.   LOS: 6 days   Emonii Wienke C 04/05/2012, 3:56 PM

## 2012-04-05 NOTE — Progress Notes (Signed)
[facility] Progress Note  Subjective: For AKA tomorrow; Ok with surgery. "better than dying"; eating ok  Objective Filed Vitals:   04/04/12 1652 04/04/12 2207 04/05/12 0537 04/05/12 1000  BP: 157/67 144/73 124/61 154/66  Pulse: 70 69 67 71  Temp: 98.4 F (36.9 C) 98.5 F (36.9 C) 97.6 F (36.4 C) 97.8 F (36.6 C)  TempSrc: Oral Oral Oral Oral  Resp: 18 18 17 20   Height:  5\' 7"  (1.702 m)    Weight:  76 kg (167 lb 8.8 oz)    SpO2: 94% 93% 95% 97%   Physical Exam General: NAD resting in bed; spirits ok Heart: RRR  Lungs: no wheezes or rales  Abdomen: soft NT + BS  Extremities: no significant LE edema; right foot wrapped  Dialysis Access:  right I-J   Dialysis Orders: Center: [place] on MWF .  EDW 76 kg HD Bath 2K/2.25Ca Time 3.5 hrs Heparin 2000 U. Access CVC @ R IJ BFR 400 DFR 800  Zemplar 1 mcg IV/HD Epogen 6600 Units IV/HD Venofer 100 mg per HD   Assessment/Plan: 1. Right lower leg and heel ulcers - Arteriogram shows need for prob AKA; on Vancomycin and Primaxin IV, For AKA tomorrow; 2. ESRD - HD on MWF @ [place], using IJ catheter (brachiocephalic AVF, placed 09/20/11, never matured), K decreased at 3.6 yesterday. HD today; ok for tight heparin  3. Hypertension/volume - BP much higher today; on Amlodipine 5 mg qd (changed to HS - got am dose today) and Coreg 25 mg bid; (outpatient EDW 76 kg).; Post HD wt Monday was 74.9 with net UF of 2 liters 4. Anemia - Hgb trending down on outpatient Epogen 6600 U and IV Fe per HD;  Aranesp ordered at 100 q week on Friday- but this was d/c. Hgb was 11.9 4/29, but would expect to drop with surgery; follow for now; evaluate post op. NOTE Aranesp was d/c by pharmacy per their protocol to hold Aranesp for HGB >11  5. Metabolic bone disease - Ca 9.8 (11.3 corrected), P 5.5 Zemplar d/c; Phoslo 2 and Fosrenol 1 g with meals. Needs 2 Ca bath when K is high enough to qualify for 2 K bath; Using 4 K bath for now; recheck labs pre HD  today. 6. Nutrition - Alb 2.1; changed to high protein renal diet when eating and add supplements  7. CAD - s/p CABG in '91 followed by 3 stents; on ASA, Plavix, Coreg and Imdur.  8. DM - on Insulin, Neurontin for diabetic neuropathy; Hgb A1C 9.3 on 4/24..  9. COPD - on inhaler 10. Urinary retention - with indwelling Foley > 1 year; 1000 cc out yesterday; also on bid lasix 80 bid and proscar.; high risk for infection; still has excellent output  Additional Objective Labs: Basic Metabolic Panel:  Lab 04/03/12 4098 04/03/12 0600 04/02/12 0653 04/01/12 0530  NA -- 130* 132* 136  K -- 3.6 3.5 4.0  CL -- 92* 93* 96  CO2 -- 20 23 23   GLUCOSE -- 152* 228* 223*  BUN -- 58* 46* 26*  CREATININE 2.62* 5.27* 4.45* --  CALCIUM -- 9.8 9.7 9.5  ALB -- -- -- --  PHOS -- 5.5* 5.0* 3.1   Liver Function Tests:  Lab 04/03/12 0600 04/02/12 0653 04/01/12 0530  AST -- -- --  ALT -- -- --  ALKPHOS -- -- --  BILITOT -- -- --  PROT -- -- --  ALBUMIN 2.1* 2.2* 2.4*  CBC:  Lab 04/03/12  1700 04/03/12 0600 04/02/12 0653 04/01/12 0530 03/31/12 0600  WBC 9.0 6.1 7.6 -- --  NEUTROABS -- -- -- -- --  HGB 11.9* 10.4* 10.5* -- --  HCT 36.3* 32.9* 34.3* -- --  MCV 89.9 93.2 94.8 95.9 95.5  PLT 298 280 269 -- --   BlCBG:  Lab 04/05/12 0739 04/04/12 2113 04/04/12 1652 04/04/12 1141 04/04/12 0743  GLUCAP 150* 228* 261* 240* 271*   Medications:      . aspirin EC  81 mg Oral Daily  . famotidine  20 mg Oral Daily  . feeding supplement (NEPRO CARB STEADY)  237 mL Oral TID WC  . finasteride  5 mg Oral Daily  . heparin  5,000 Units Subcutaneous Q8H  . imipenem-cilastatin  250 mg Intravenous Q12H  . insulin aspart  0-15 Units Subcutaneous TID WC  . insulin aspart  0-5 Units Subcutaneous QHS  . insulin glargine  40 Units Subcutaneous QHS  . isosorbide mononitrate  120 mg Oral QHS  . levothyroxine  75 mcg Oral Q0600  . multivitamin  1 tablet Oral Daily  . pantoprazole  40 mg Oral QHS  . rOPINIRole  2  mg Oral QHS  . simvastatin  20 mg Oral q1800  . sodium chloride  3 mL Intravenous Q12H  . vancomycin  750 mg Intravenous Q M,W,F-HD  . DISCONTD: famotidine  20 mg Oral BID    I  have reviewed scheduled and prn medications.  Sheffield Slider, PA-C [facility] Beeper [phone]  04/05/2012,10:42 AM  LOS: 6 days

## 2012-04-05 NOTE — Procedures (Signed)
I was present at this session.  I have reviewed the session itself and made appropriate changes.  Turhan Chill K. 5/1/20131:12 PM

## 2012-04-05 NOTE — Progress Notes (Signed)
I have seen and examined this patient and agree with the assessment/plan as outlined above by [facility] PA. The patient is planned for a Rt AKA tomorrow to manage a poorly healing ulcer after he declined conservative measures for re-vascularization,  Brihana Quickel K.,MD 04/05/2012 12:55 PM

## 2012-04-05 NOTE — Progress Notes (Signed)
ANTIBIOTIC CONSULT NOTE - FOLLOW UP  Pharmacy Consult for Vanc + Primaxin Indication: RLE cellulitis + diabetic ulcers  Allergies  Allergen Reactions  . Morphine And Related Other (See Comments)    "makes me hallucinate"  . Penicillins Rash  . Nsaids Other (See Comments)    "don't really know"    Patient Measurements: Height: 5\' 7"  (170.2 cm) Weight: 167 lb 8.8 oz (76 kg) IBW/kg (Calculated) : 66.1   Vital Signs: Temp: 97.8 F (36.6 C) (05/01 1000) Temp src: Oral (05/01 1000) BP: 154/66 mmHg (05/01 1000) Pulse Rate: 71  (05/01 1000) Intake/Output from previous day: 04/30 0701 - 05/01 0700 In: 240 [P.O.:240] Out: 25 [Urine:25] Intake/Output from this shift:    Labs:  Basename 04/03/12 2230 04/03/12 1700 04/03/12 0600  WBC -- 9.0 6.1  HGB -- 11.9* 10.4*  PLT -- 298 280  LABCREA -- -- --  CREATININE 2.62* -- 5.27*   Estimated Creatinine Clearance: 22.8 ml/min (by C-G formula based on Cr of 2.62). No results found for this basename: VANCOTROUGH:2,VANCOPEAK:2,VANCORANDOM:2,GENTTROUGH:2,GENTPEAK:2,GENTRANDOM:2,TOBRATROUGH:2,TOBRAPEAK:2,TOBRARND:2,AMIKACINPEAK:2,AMIKACINTROU:2,AMIKACIN:2, in the last 72 hours   Microbiology: Recent Results (from the past 720 hour(s))  MRSA PCR SCREENING     Status: Abnormal   Collection Time   03/31/12  5:19 AM      Component Value Range Status Comment   MRSA by PCR POSITIVE (*) NEGATIVE  Final     Anti-infectives     Start     Dose/Rate Route Frequency Ordered Stop   04/03/12 2100   imipenem-cilastatin (PRIMAXIN) 250 mg in sodium chloride 0.9 % 100 mL IVPB        250 mg 200 mL/hr over 30 Minutes Intravenous Every 12 hours 04/03/12 1700     04/03/12 1700   vancomycin (VANCOCIN) 750 mg in sodium chloride 0.9 % 150 mL IVPB        750 mg 150 mL/hr over 60 Minutes Intravenous Every M-W-F (Hemodialysis) 04/03/12 1659     03/31/12 1200   vancomycin (VANCOCIN) 750 mg in sodium chloride 0.9 % 150 mL IVPB  Status:  Discontinued        750 mg 150 mL/hr over 60 Minutes Intravenous Every M-W-F (Hemodialysis) 03/31/12 1019 04/03/12 1619   03/31/12 0500   imipenem-cilastatin (PRIMAXIN) 500 mg in sodium chloride 0.9 % 100 mL IVPB  Status:  Discontinued        500 mg 200 mL/hr over 30 Minutes Intravenous Every 12 hours 03/30/12 2228 03/30/12 2228   03/31/12 0500   imipenem-cilastatin (PRIMAXIN) 250 mg in sodium chloride 0.9 % 100 mL IVPB  Status:  Discontinued        250 mg 200 mL/hr over 30 Minutes Intravenous Every 12 hours 03/30/12 2229 04/03/12 1619   03/31/12 0000   vancomycin (VANCOCIN) 750 mg in sodium chloride 0.9 % 150 mL IVPB        750 mg 150 mL/hr over 60 Minutes Intravenous  Once 03/30/12 2228 03/31/12 0222          Assessment: 77 y.o. M with ESRD on Vancomycin + Primaxin for empiric coverage of RLE cellulitis and diabetic ulcers. The patient receives HD MWF and is scheduled to receive HD today (5/1). Doses remain appropriate at this time. Note plans for AKA on Thursday, 5/2 -- will follow up with antibiotic LOT post-op. Usual duration post surgery is 24-48 hours after source of infection removed if clean margins.    Goal of Therapy:  Pre-HD Vancomycin level of 15-25 mcg/ml  Plan:  1. Continue  Primaxin 250 mg IV every 12 hours 2. Continue Vancomycin 750 mg IV post HD MWF 3. Will continue to follow HD schedule/duration, culture results, LOT, and antibiotic de-escalation plans   Georgina Pillion, PharmD, BCPS Clinical Pharmacist Pager: [phone] 04/05/2012 11:07 AM

## 2012-04-06 ENCOUNTER — Encounter (HOSPITAL_COMMUNITY): Payer: Self-pay | Admitting: Anesthesiology

## 2012-04-06 ENCOUNTER — Inpatient Hospital Stay (HOSPITAL_COMMUNITY): Payer: PRIVATE HEALTH INSURANCE | Admitting: Anesthesiology

## 2012-04-06 ENCOUNTER — Ambulatory Visit: Payer: PRIVATE HEALTH INSURANCE | Admitting: Vascular Surgery

## 2012-04-06 ENCOUNTER — Encounter (HOSPITAL_COMMUNITY)
Admission: AD | Disposition: A | Discharge status: Skilled Nursing Facility | Payer: Self-pay | Source: Other Acute Inpatient Hospital | Attending: Internal Medicine

## 2012-04-06 DIAGNOSIS — N186 End stage renal disease: Secondary | ICD-10-CM

## 2012-04-06 DIAGNOSIS — L97509 Non-pressure chronic ulcer of other part of unspecified foot with unspecified severity: Secondary | ICD-10-CM

## 2012-04-06 DIAGNOSIS — E119 Type 2 diabetes mellitus without complications: Secondary | ICD-10-CM

## 2012-04-06 DIAGNOSIS — I10 Essential (primary) hypertension: Secondary | ICD-10-CM

## 2012-04-06 HISTORY — PX: AMPUTATION: SHX166

## 2012-04-06 LAB — GLUCOSE, CAPILLARY: Glucose-Capillary: 249 mg/dL — ABNORMAL HIGH (ref 70–99)

## 2012-04-06 LAB — BASIC METABOLIC PANEL
BUN: 24 mg/dL — ABNORMAL HIGH (ref 6–23)
Chloride: 100 mEq/L (ref 96–112)
Creatinine, Ser: 3.19 mg/dL — ABNORMAL HIGH (ref 0.50–1.35)
GFR calc Af Amer: 20 mL/min — ABNORMAL LOW (ref >90)
Glucose, Bld: 217 mg/dL — ABNORMAL HIGH (ref 70–99)

## 2012-04-06 SURGERY — ANGIOGRAM, LOWER EXTREMITY
Anesthesia: LOCAL

## 2012-04-06 SURGERY — AMPUTATION, ABOVE KNEE
Anesthesia: General | Site: Leg Upper | Laterality: Right | Wound class: Clean

## 2012-04-06 MED ORDER — DARBEPOETIN ALFA-POLYSORBATE 100 MCG/0.5ML IJ SOLN
100.0000 ug | INTRAMUSCULAR | Status: DC
  Administered 2012-04-07: 100 ug via INTRAVENOUS
  Filled 2012-04-06: qty 0.5

## 2012-04-06 MED ORDER — LIDOCAINE HCL (CARDIAC) 20 MG/ML IV SOLN
INTRAVENOUS | Status: DC | PRN
  Administered 2012-04-06: 80 mg via INTRAVENOUS

## 2012-04-06 MED ORDER — ACETAMINOPHEN 10 MG/ML IV SOLN
INTRAVENOUS | Status: AC
  Filled 2012-04-06: qty 100

## 2012-04-06 MED ORDER — BACITRACIN ZINC 500 UNIT/GM EX OINT
TOPICAL_OINTMENT | CUTANEOUS | Status: DC | PRN
  Administered 2012-04-06: 1 via TOPICAL

## 2012-04-06 MED ORDER — ONDANSETRON HCL 4 MG/2ML IJ SOLN: 4.0000 mg | Freq: Four times a day (QID) | INTRAMUSCULAR | Status: DC | PRN

## 2012-04-06 MED ORDER — FENTANYL CITRATE 0.05 MG/ML IJ SOLN: 50.0000 ug | INTRAMUSCULAR | Status: DC | PRN

## 2012-04-06 MED ORDER — PROPOFOL 10 MG/ML IV EMUL
INTRAVENOUS | Status: DC | PRN
  Administered 2012-04-06: 70 mg via INTRAVENOUS
  Administered 2012-04-06: 120 mg via INTRAVENOUS

## 2012-04-06 MED ORDER — SODIUM CHLORIDE 0.9 % IV SOLN
INTRAVENOUS | Status: DC | PRN
  Administered 2012-04-06 (×2): via INTRAVENOUS

## 2012-04-06 MED ORDER — FENTANYL CITRATE 0.05 MG/ML IJ SOLN
INTRAMUSCULAR | Status: DC | PRN
  Administered 2012-04-06: 50 ug via INTRAVENOUS
  Administered 2012-04-06: 75 ug via INTRAVENOUS

## 2012-04-06 MED ORDER — MIDAZOLAM HCL 5 MG/5ML IJ SOLN
INTRAMUSCULAR | Status: DC | PRN
  Administered 2012-04-06: 1 mg via INTRAVENOUS

## 2012-04-06 MED ORDER — LIDOCAINE HCL 4 % MT SOLN
OROMUCOSAL | Status: DC | PRN
  Administered 2012-04-06: 4 mL via TOPICAL

## 2012-04-06 MED ORDER — HYDROMORPHONE HCL PF 1 MG/ML IJ SOLN
0.2500 mg | INTRAMUSCULAR | Status: AC | PRN
  Administered 2012-04-06: 0.5 mg via INTRAVENOUS
  Administered 2012-04-06: 0.25 mg via INTRAVENOUS
  Administered 2012-04-06 (×3): 0.5 mg via INTRAVENOUS
  Administered 2012-04-06: 0.25 mg via INTRAVENOUS
  Administered 2012-04-07 (×2): 0.5 mg via INTRAVENOUS
  Filled 2012-04-06 (×5): qty 1

## 2012-04-06 MED ORDER — SODIUM CHLORIDE 0.9 % IV SOLN: INTRAVENOUS | Status: DC

## 2012-04-06 MED ORDER — SUCCINYLCHOLINE CHLORIDE 20 MG/ML IJ SOLN
INTRAMUSCULAR | Status: DC | PRN
  Administered 2012-04-06: 80 mg via INTRAVENOUS

## 2012-04-06 MED ORDER — CEFAZOLIN SODIUM 1-5 GM-% IV SOLN
INTRAVENOUS | Status: DC | PRN
  Administered 2012-04-06: 1 g via INTRAVENOUS

## 2012-04-06 MED ORDER — HETASTARCH-ELECTROLYTES 6 % IV SOLN
INTRAVENOUS | Status: DC | PRN
  Administered 2012-04-06: 13:00:00 via INTRAVENOUS

## 2012-04-06 MED ORDER — ACETAMINOPHEN 10 MG/ML IV SOLN
INTRAVENOUS | Status: DC | PRN
  Administered 2012-04-06: 1000 mg via INTRAVENOUS

## 2012-04-06 MED ORDER — CEFAZOLIN SODIUM 1-5 GM-% IV SOLN
INTRAVENOUS | Status: AC
  Filled 2012-04-06: qty 50

## 2012-04-06 MED ORDER — PHENYLEPHRINE HCL 10 MG/ML IJ SOLN
20.0000 mg | INTRAVENOUS | Status: DC | PRN
  Administered 2012-04-06: 50 ug/min via INTRAVENOUS

## 2012-04-06 MED ORDER — MIDAZOLAM HCL 2 MG/2ML IJ SOLN: 1.0000 mg | INTRAMUSCULAR | Status: DC | PRN

## 2012-04-06 MED ORDER — LANTHANUM CARBONATE 500 MG PO CHEW
1000.0000 mg | CHEWABLE_TABLET | Freq: Three times a day (TID) | ORAL | Status: DC
  Administered 2012-04-08 – 2012-04-09 (×3): 1000 mg via ORAL
  Filled 2012-04-06 (×12): qty 2

## 2012-04-06 MED ORDER — LORAZEPAM 2 MG/ML IJ SOLN: 1.0000 mg | Freq: Once | INTRAMUSCULAR | Status: AC | PRN

## 2012-04-06 MED ORDER — 0.9 % SODIUM CHLORIDE (POUR BTL) OPTIME
TOPICAL | Status: DC | PRN
  Administered 2012-04-06: 1000 mL

## 2012-04-06 SURGICAL SUPPLY — 63 items
BANDAGE ESMARK 6X9 LF (GAUZE/BANDAGES/DRESSINGS) ×1 IMPLANT
BLADE SAW RECIP 87.9 MT (BLADE) ×2 IMPLANT
BNDG COHESIVE 4X5 TAN STRL (GAUZE/BANDAGES/DRESSINGS) ×2 IMPLANT
BNDG COHESIVE 6X5 TAN STRL LF (GAUZE/BANDAGES/DRESSINGS) ×2 IMPLANT
BNDG ESMARK 6X9 LF (GAUZE/BANDAGES/DRESSINGS) ×2
CHLORAPREP W/TINT 26ML (MISCELLANEOUS) ×2 IMPLANT
CLOTH BEACON ORANGE TIMEOUT ST (SAFETY) ×2 IMPLANT
COVER SURGICAL LIGHT HANDLE (MISCELLANEOUS) ×2 IMPLANT
CUFF TOURNIQUET SINGLE 34IN LL (TOURNIQUET CUFF) ×2 IMPLANT
CUFF TOURNIQUET SINGLE 44IN (TOURNIQUET CUFF) IMPLANT
DRAIN PENROSE 1/2X12 LTX STRL (WOUND CARE) IMPLANT
DRAPE EXTREMITY T 121X128X90 (DRAPE) ×2 IMPLANT
DRAPE PROXIMA HALF (DRAPES) ×2 IMPLANT
DRAPE U-SHAPE 47X51 STRL (DRAPES) ×2 IMPLANT
DRESSING MATRIX WOUND 4X5 (GAUZE/BANDAGES/DRESSINGS) ×1 IMPLANT
DRILL BIT 7/64X5 (BIT) ×4 IMPLANT
DRSG ADAPTIC 3X8 NADH LF (GAUZE/BANDAGES/DRESSINGS) ×2 IMPLANT
DRSG MATRIX WOUND 4X5 (GAUZE/BANDAGES/DRESSINGS) ×2
DRSG PAD ABDOMINAL 8X10 ST (GAUZE/BANDAGES/DRESSINGS) ×2 IMPLANT
DURAPREP 26ML APPLICATOR (WOUND CARE) IMPLANT
ELECT CAUTERY BLADE 6.4 (BLADE) IMPLANT
ELECT REM PT RETURN 9FT ADLT (ELECTROSURGICAL) ×2
ELECTRODE REM PT RTRN 9FT ADLT (ELECTROSURGICAL) ×1 IMPLANT
EVACUATOR 1/8 PVC DRAIN (DRAIN) IMPLANT
GAUZE XEROFORM 5X9 LF (GAUZE/BANDAGES/DRESSINGS) ×2 IMPLANT
GLOVE BIO SURGEON STRL SZ8 (GLOVE) ×2 IMPLANT
GLOVE BIOGEL PI IND STRL 7.0 (GLOVE) ×1 IMPLANT
GLOVE BIOGEL PI IND STRL 7.5 (GLOVE) ×1 IMPLANT
GLOVE BIOGEL PI IND STRL 8 (GLOVE) ×1 IMPLANT
GLOVE BIOGEL PI INDICATOR 7.0 (GLOVE) ×1
GLOVE BIOGEL PI INDICATOR 7.5 (GLOVE) ×1
GLOVE BIOGEL PI INDICATOR 8 (GLOVE) ×1
GLOVE ORTHO TXT STRL SZ7.5 (GLOVE) ×2 IMPLANT
GLOVE SURG SS PI 7.5 STRL IVOR (GLOVE) ×6 IMPLANT
GOWN PREVENTION PLUS XLARGE (GOWN DISPOSABLE) ×2 IMPLANT
GOWN STRL NON-REIN LRG LVL3 (GOWN DISPOSABLE) ×4 IMPLANT
GOWN STRL REIN 2XL LVL4 (GOWN DISPOSABLE) ×2 IMPLANT
KIT BASIN OR (CUSTOM PROCEDURE TRAY) ×2 IMPLANT
KIT ROOM TURNOVER OR (KITS) ×2 IMPLANT
MANIFOLD NEPTUNE II (INSTRUMENTS) ×2 IMPLANT
NS IRRIG 1000ML POUR BTL (IV SOLUTION) ×2 IMPLANT
PACK GENERAL/GYN (CUSTOM PROCEDURE TRAY) ×2 IMPLANT
PAD ARMBOARD 7.5X6 YLW CONV (MISCELLANEOUS) ×4 IMPLANT
PADDING CAST COTTON 6X4 STRL (CAST SUPPLIES) ×2 IMPLANT
PENCIL BUTTON BLDE SNGL 10FT (ELECTRODE) ×2 IMPLANT
SPONGE GAUZE 4X4 12PLY (GAUZE/BANDAGES/DRESSINGS) ×2 IMPLANT
SPONGE LAP 18X18 X RAY DECT (DISPOSABLE) ×2 IMPLANT
STAPLER VISISTAT 35W (STAPLE) IMPLANT
STOCKINETTE IMPERVIOUS LG (DRAPES) ×2 IMPLANT
SUT PDS AB 1 CT  36 (SUTURE)
SUT PDS AB 1 CT 36 (SUTURE) IMPLANT
SUT PROLENE 3 0 PS 2 (SUTURE) ×4 IMPLANT
SUT SILK 2 0 (SUTURE) ×1
SUT SILK 2-0 18XBRD TIE 12 (SUTURE) ×1 IMPLANT
SUT VIC AB 1 CTX 36 (SUTURE)
SUT VIC AB 1 CTX36XBRD ANBCTR (SUTURE) IMPLANT
SUT VIC AB 2-0 CT1 27 (SUTURE) ×2
SUT VIC AB 2-0 CT1 TAPERPNT 27 (SUTURE) ×2 IMPLANT
SWAB COLLECTION DEVICE MRSA (MISCELLANEOUS) IMPLANT
TOWEL OR 17X24 6PK STRL BLUE (TOWEL DISPOSABLE) ×2 IMPLANT
TOWEL OR 17X26 10 PK STRL BLUE (TOWEL DISPOSABLE) ×2 IMPLANT
TUBE ANAEROBIC SPECIMEN COL (MISCELLANEOUS) IMPLANT
WATER STERILE IRR 1000ML POUR (IV SOLUTION) IMPLANT

## 2012-04-06 NOTE — Preoperative (Signed)
Beta Blockers   Reason not to administer Beta Blockers:Not Applicable 

## 2012-04-06 NOTE — Brief Op Note (Signed)
03/30/2012 - 04/06/2012  12:57 PM  PATIENT:  [name] [name]  78 y.o. male  PRE-OPERATIVE DIAGNOSIS:  Right leg and heel nonhealing diabletic ulcers and peripheral vascular disease  POST-OPERATIVE DIAGNOSIS:  Right leg and heel nonhealing diabletic ulcers and peripheral vascular disease  Procedure(s): Right above knee amputation  SURGEON:  Toni Arthurs, MD  ASSISTANT: n/a  ANESTHESIA:   General  EBL:  500   TOURNIQUET:   Total Tourniquet Time Documented: Thigh (Right) - 43 minutes  COMPLICATIONS:  None apparent  DISPOSITION:  Extubated, awake and stable to recovery.  DICTATION ID:   [id number]

## 2012-04-06 NOTE — Progress Notes (Signed)
Subjective: Status post  AKA today, denies any c/o.Family at bedside  Objective: Vital signs in last 24 hours: Temp:  [96.8 F (36 C)-98.9 F (37.2 C)] 97.3 F (36.3 C) (05/02 1447) Pulse Rate:  [63-84] 66  (05/02 1447) Resp:  [12-25] 20  (05/02 1447) BP: (127-184)/(59-134) 178/79 mmHg (05/02 1447) SpO2:  [93 %-100 %] 100 % (05/02 1447) Weight:  [76.5 kg (168 lb 10.4 oz)] 76.5 kg (168 lb 10.4 oz) (05/01 1900) Weight change: 2.5 kg (5 lb 8.2 oz) Last BM Date: 04/06/12  Intake/Output from previous day: 05/01 0701 - 05/02 0700 In: 0  Out: 2000  Total I/O In: 1480 [P.O.:30; I.V.:550; IV Piggyback:900] Out: 826 [Urine:325; Emesis/NG output:1; Blood:500]   Physical Exam: General: Alert and oriented x3 not short of breath at rest.  HEENT:  Sclera anicteric, no conjunctival injection or discharge. NECK:  Supple, JVP not seen, no carotid bruits, no palpable lymphadenopathy, no palpable goiter. CHEST: clear to auscultation, no wheezes, no crackles. HEART:  normal S1-S2, regular, no murmurs. ABDOMEN:  Moderately obese, soft, non-tender, no palpable organomegaly, no palpable masses, normal bowel sounds. GENITALIA:  Not examined. LOWER EXTREMITIES:  Right AKA stump with dressing clean and dry, LLE with SCD CENTRAL NERVOUS SYSTEM:  No focal neurologic deficit on gross examination.  Lab Results:  Basename 04/05/12 1205  WBC 7.4  HGB 10.2*  HCT 32.2*  PLT 302    Basename 04/06/12 0605 04/05/12 1821  NA 137 136  K 4.1 3.9  CL 100 98  CO2 23 25  GLUCOSE 217* 149*  BUN 24* 12  CREATININE 3.19* 2.10*  CALCIUM 9.4 9.4   Recent Results (from the past 240 hour(s))  MRSA PCR SCREENING     Status: Abnormal   Collection Time   03/31/12  5:19 AM      Component Value Range Status Comment   MRSA by PCR POSITIVE (*) NEGATIVE  Final      Studies/Results: No results found.  Medications: Scheduled Meds:    . aspirin EC  81 mg Oral Daily  . carvedilol  25 mg Oral BID WC  .  chlorhexidine  60 mL Topical Once  . darbepoetin (ARANESP) injection - DIALYSIS  100 mcg Intravenous Q Fri-HD  . famotidine  20 mg Oral Daily  . feeding supplement (NEPRO CARB STEADY)  237 mL Oral TID WC  . finasteride  5 mg Oral Daily  . imipenem-cilastatin  250 mg Intravenous Q12H  . insulin aspart  0-15 Units Subcutaneous TID WC  . insulin aspart  0-5 Units Subcutaneous QHS  . insulin glargine  40 Units Subcutaneous QHS  . isosorbide mononitrate  120 mg Oral QHS  . lanthanum  1,000 mg Oral TID WC  . levothyroxine  75 mcg Oral Q0600  . multivitamin  1 tablet Oral Daily  . pantoprazole  40 mg Oral QHS  . rOPINIRole  2 mg Oral QHS  . simvastatin  20 mg Oral q1800  . sodium chloride  3 mL Intravenous Q12H  . vancomycin  750 mg Intravenous Q M,W,F-HD  . DISCONTD:  ceFAZolin (ANCEF) IV  1 g Intravenous 60 min Pre-Op  . DISCONTD: insulin glargine  40 Units Subcutaneous QHS   Continuous Infusions:    . DISCONTD: sodium chloride 50 mL/hr at 04/06/12 1044  . DISCONTD: sodium chloride     PRN Meds:.sodium chloride, acetaminophen, ALPRAZolam, fentaNYL, hydrALAZINE, hydrocortisone cream, HYDROmorphone, LORazepam, midazolam, ondansetron (ZOFRAN) IV, sodium chloride, traMADol, DISCONTD: 0.9 % irrigation (POUR BTL), DISCONTD: bacitracin,  DISCONTD: fentaNYL, DISCONTD: midazolam, DISCONTD: ondansetron  Assessment/Plan:  Principal Problem:  *Foot ulcer due to secondary DM: Patient presented with an obviously infected lage and deep RLE wound, which he says has been present only for 2 weeks, without antecedent trauma. He also has a more chronic right heel wound. He is now on Vancmycin/Primaxin day#5. Dr Victorino Dike provided orthopedic consultation. Unfortunately, MRI was not feasible, due to presence of a pacer. CT scan may be a suitable alternative, but we shall defer to orthopedic recommendations. Dr Victorino Dike felt that evaluation of peripheral arterial circulation is indicated. Dr Hart [place] provided vascular  surgical consultation, and patient underwent arteriogram on 04/03/12. > per Vasc pt was offered an orbital atherectomy and angioplasty of right femoropopliteal stenosis. He declined and decided to proceed with R AKA. -S/P AKA today 5/2, appreciate Vasc and ortho assistance. Active Problems:  1. Cellulitis: See above.  2. ESRD (end stage renal disease): Patient is on HD, M-W-F.  -dialysis per renal  3. DM2 (diabetes mellitus, type 2): CBGs 186-249 -continue Lantus, SSI follow adjust dose accordingly  4. HTN (hypertension):  BP stable/elevated off  Norvasc, Coreg, Benicar and Lasix discontinued on 4:30 secondary to hypotension. -continue Coreg only for now(with hold parameters)- stable/ok control  LOS: 7 days   Autry Droege C 04/06/2012, 5:35 PM

## 2012-04-06 NOTE — Progress Notes (Signed)
Clinical Social Worker continuing to follow for disposition planning. Pt is from [facility] and plans to return at discharge. Per MD, pt scheduled for right AKA today and will work with PT following surgery. Clinical Social Worker contacted [facility] and updated on pt status. Clinical Social Worker to continue to follow and facilitate pt discharge needs when pt medically stable for discharge.  Jacklynn Lewis, MSW, LCSWA  Clinical Social Work [phone]

## 2012-04-06 NOTE — Anesthesia Postprocedure Evaluation (Signed)
  Anesthesia Post-op Note  Patient: [name] [name]  Procedure(s) Performed: Procedure(s) (LRB): AMPUTATION ABOVE KNEE (Right)  Patient Location: PACU  Anesthesia Type: General  Level of Consciousness: awake  Airway and Oxygen Therapy: Patient Spontanous Breathing  Post-op Pain: mild  Post-op Assessment: Post-op Vital signs reviewed, Patient's Cardiovascular Status Stable, Respiratory Function Stable, Patent Airway, No signs of Nausea or vomiting and Pain level controlled  Post-op Vital Signs: stable  Complications: No apparent anesthesia complications

## 2012-04-06 NOTE — Op Note (Signed)
NAMEBRENTT, FREAD NO.:  [id number]  MEDICAL RECORD NO.:  [id number]  LOCATION:  6738                         FACILITY:  [facility]  PHYSICIAN:  Toni Arthurs, MD        DATE OF BIRTH:  11-05-1934  DATE OF PROCEDURE:  04/06/2012 DATE OF DISCHARGE:                              OPERATIVE REPORT   PREOPERATIVE DIAGNOSES:  Right leg and heel nonhealing diabetic ulcers and severe peripheral vascular disease.  POSTOPERATIVE DIAGNOSES:  Right leg and heel nonhealing diabetic ulcers and severe peripheral vascular disease.  PROCEDURE:  Right above-knee amputation.  SURGEON:  Toni Arthurs, MD  ANESTHESIA:  General.  ESTIMATED BLOOD LOSS:  500 mL.  TOURNIQUET TIME:  43 minutes at 350 mmHg.  COMPLICATIONS:  None apparent.  DISPOSITION:  Extubated, awake, and stable to recovery.  INDICATIONS FOR PROCEDURE:  The patient is a 78 year old male with past medical history Significant for end-stage renal disease, coronary artery disease, and peripheral vascular disease.  He has a history of diabetes with a nonhealing ulcer on the right side of his leg for more than 6 weeks.  He has failed local wound care and pressure relief treatments. He was admitted to the hospital for ascending cellulitis of the right leg.  This is brought under control with antibiotics.  Vascular consultation was obtained from Drs. Imogene Burn and [place].  The patient declined attempts at improved blood flow to the right lower extremity. The location of the ulcer and its lack of healing as well as his poor blood supply makes healing the wound itself or a below-knee amputation extremely unlikely.  I offered both of these treatments to the patient as well as the alternative of an above-knee amputation, which I believe is much more likely to heal.  He understands the risks and benefits of the alternative treatment options and elects above-knee amputation for treatment of these nonhealing ulcers in the face of his  multiple comorbidities and severe peripheral vascular disease.  He specifically understands the risks of bleeding, infection, nerve damage, blood clots, need for additional surgery, amputation, and death.  PROCEDURE IN DETAIL:  After preoperative consent was obtained and the correct operative site was identified, the patient was brought to the operating room and placed supine on the operating table.  General anesthesia was induced.  Preoperative antibiotics were administered.  A surgical time-out was taken.  The right lower extremity was prepped and draped in standard sterile fashion.  A fishmouth incision was marked on the skin just above the superior pole of the patella.  The extremity was exsanguinated and the tourniquet was inflated to 350 mmHg.  The incision was made, sharp dissection was carried down through the skin, subcutaneous tissue as well as the anterior compartment musculature. The periosteum was elevated proximally at the level of the apex of the fishmouth incision.  Reciprocating saw was used to transect the femur. The posterior compartment musculature was then cut with an amputation knife.  The leg was passed off the field as a specimen to pathology. Hemostats were placed around the femoral artery and vein.  These were each doubly ligated with 2-0 silk ties.  The sciatic nerve was isolated, it was  placed on gentle stretch and transected sharply.  It was allowed to retract into the posterior musculature.  Two drill holes were then made in the distal femur.  The adductor musculature was then repaired to the distal femur through the drill hole with #1 Vicryl.  Posterior compartment musculature was similarly repaired to the posterior femur through the drill hole with #1 Vicryl.  Prior to beginning of the closure, the wound had been irrigated copiously.  The anterior and posterior compartment fascia were then repaired with 2-0 Vicryl and #1 Vicryl sutures.  Subcutaneous  tissue was closed with inverted simple sutures of 3-0 Vicryl.  The skin was closed with a running 3-0 Prolene. Sterile dressings were applied followed by a compression wrap. Tourniquet was released at 43 minutes after application of the dressings.  The patient was then awakened from anesthesia and transported to the recovery room in stable condition.  FOLLOWUP PLAN:  The patient will be readmitted to the Medicine Service. He will likely have dialysis tomorrow and begin physical therapy for transfers.     Toni Arthurs, MD     JH/MEDQ  D:  04/06/2012  T:  04/06/2012  Job:  [id number]

## 2012-04-06 NOTE — Progress Notes (Signed)
[facility] Progress Note  Subjective: Ready to get it overwith.  12 family  Members in room and 5 in the hall.  Objective Filed Vitals:   04/05/12 1800 04/05/12 1900 04/06/12 0535 04/06/12 0952  BP: 152/78 161/100 127/74 127/71  Pulse: 84 81 75 75  Temp: 98.3 F (36.8 C) 98.9 F (37.2 C) 97.8 F (36.6 C) 98.7 F (37.1 C)  TempSrc: Oral Oral Oral Oral  Resp: 20 20 20 18   Height:      Weight:  76.5 kg (168 lb 10.4 oz)    SpO2: 97% 94% 96% 93%   Physical Exam General: NAD Heart: RRR Lungs: no wheezes or rales Abdomen:soft NT Extremities: no LE edema Dialysis Access: right I-J  Dialysis Orders: Center: [facility] on MWF .  EDW 76 kg HD Bath 2K/2.25Ca Time 3.5 hrs Heparin 2000 U. Access CVC @ R IJ BFR 400 DFR 800  Zemplar 1 mcg IV/HD Epogen 6600 Units IV/HD Venofer 100 mg per HD through 4/24  Assessment/Plan: 1. Right lower leg and heel ulcers -  AKA today on Vancomycin and Primaxin IV, 2. ESRD - HD on MWF @ [facility], using IJ catheter (brachiocephalic AVF, placed 09/20/11, never matured), no  heparin Friday 3. Hypertension/volume - on Amlodipine 5 mg  and Coreg 25 mg bid; (outpatient EDW 76 kg).; Post HD wt Monday was 74.9 with net UF of 2 liters. Readjust EDW after AKA; UF 2 liters Wed 4. Anemia - Hgb trending down on outpatient Epogen 6600 U and IV Fe per HD; Aranesp ordered at 100 q week on Friday- but this was d/c. Hgb was 11.9 4/29, but would expect to drop with surgery; follow for now; evaluate post op. NOTE Aranesp was d/c by pharmacy per their protocol to hold Aranesp for HGB >11; Hgb 10.2 yesterday; I think 11.9 was false high.  RESUME Aranesp 100 on Friday; will have blood loss with AKA 5. Metabolic bone disease -  Adjusted Ca better at 10.8 Zemplar d/c; Phoslo 2 and Fosrenol 1 g with meals were not resumed after arteriogram; add back fosrenol only and need to be sure this is resumed post surgery; prone to hypercalcemia 6. Nutrition - Alb 2.2; changed to  high protein renal diet when eating and add supplements  7. CAD - s/p CABG in '91 followed by 3 stents; on ASA, Plavix, Coreg and Imdur.  8. DM - on Insulin, Neurontin for diabetic neuropathy; Hgb A1C 9.3 on 4/24..  9. COPD - on inhaler        10.Urinary retention - with indwelling Foley > 1 year; ?? output yesterday - only HD UF recorded; also on bid lasix 80 bid and proscar.; high risk for infection  Additional Objective Labs: Basic Metabolic Panel:  Lab 04/06/12 1610 04/05/12 1821 04/05/12 1207 04/03/12 0600 04/02/12 0653  NA 137 136 130* -- --  K 4.1 3.9 3.5 -- --  CL 100 98 93* -- --  CO2 23 25 24  -- --  GLUCOSE 217* 149* 153* -- --  BUN 24* 12 45* -- --  CREATININE 3.19* 2.10* 4.95* -- --  CALCIUM 9.4 9.4 9.1 -- --  ALB -- -- -- -- --  PHOS -- -- 5.9* 5.5* 5.0*   Liver Function Tests:  Lab 04/05/12 1207 04/03/12 0600 04/02/12 0653  AST -- -- --  ALT -- -- --  ALKPHOS -- -- --  BILITOT -- -- --  PROT -- -- --  ALBUMIN 2.2* 2.1* 2.2*   CBC:  Lab 04/05/12 1205 04/03/12 1700 04/03/12 0600 04/02/12 0653 04/01/12 0530  WBC 7.4 9.0 6.1 -- --  NEUTROABS -- -- -- -- --  HGB 10.2* 11.9* 10.4* -- --  HCT 32.2* 36.3* 32.9* -- --  MCV 92.8 89.9 93.2 94.8 95.9  PLT 302 298 280 -- --  CBG:  Lab 04/06/12 0754 04/05/12 2132 04/05/12 1647 04/05/12 1530 04/05/12 0739  GLUCAP 202* 199* 149* 127* 150*   IMedications:    . sodium chloride        . aspirin EC  81 mg Oral Daily  . carvedilol  25 mg Oral BID WC  . chlorhexidine  60 mL Topical Once  . famotidine  20 mg Oral Daily  . feeding supplement (NEPRO CARB STEADY)  237 mL Oral TID WC  . finasteride  5 mg Oral Daily  . imipenem-cilastatin  250 mg Intravenous Q12H  . insulin aspart  0-15 Units Subcutaneous TID WC  . insulin aspart  0-5 Units Subcutaneous QHS  . insulin glargine  40 Units Subcutaneous QHS  . isosorbide mononitrate  120 mg Oral QHS  . levothyroxine  75 mcg Oral Q0600  . multivitamin  1 tablet Oral  Daily  . pantoprazole  40 mg Oral QHS  . rOPINIRole  2 mg Oral QHS  . simvastatin  20 mg Oral q1800  . sodium chloride  3 mL Intravenous Q12H  . vancomycin  750 mg Intravenous Q M,W,F-HD  . DISCONTD:  ceFAZolin (ANCEF) IV  1 g Intravenous 60 min Pre-Op  . DISCONTD: heparin  5,000 Units Subcutaneous Q8H  . DISCONTD: insulin glargine  40 Units Subcutaneous QHS    I  have reviewed scheduled and prn medications.  Sheffield Slider, PA-C [facility] [phone]  04/06/2012,10:03 AM  LOS: 7 days

## 2012-04-06 NOTE — Transfer of Care (Signed)
Immediate Anesthesia Transfer of Care Note  Patient: [name] [name]  Procedure(s) Performed: Procedure(s) (LRB): AMPUTATION ABOVE KNEE (Right)  Patient Location: PACU  Anesthesia Type: General  Level of Consciousness: awake, alert , oriented and patient cooperative  Airway & Oxygen Therapy: Patient Spontanous Breathing and Patient connected to nasal cannula oxygen  Post-op Assessment: Report given to PACU RN and Post -op Vital signs reviewed and stable  Post vital signs: Reviewed and stable  Complications: No apparent anesthesia complications

## 2012-04-06 NOTE — Progress Notes (Signed)
I have seen and examined this patient and agree with the assessment/plan as outlined above by [facility] PA. Plans for AKA today. Willetta York K.,MD 04/06/2012 12:25 PM

## 2012-04-06 NOTE — Anesthesia Preprocedure Evaluation (Addendum)
Anesthesia Evaluation  Patient identified by MRN, date of birth, ID band Patient awake    Reviewed: Allergy & Precautions, H&P , NPO status , Patient's Chart, lab work & pertinent test results  Airway Mallampati: I TM Distance: >3 FB Neck ROM: Full    Dental  (+) Edentulous Upper and Edentulous Lower   Pulmonary COPD oxygen dependent, former smoker   Pulmonary exam normal       Cardiovascular hypertension, + CAD, + Past MI and +CHF + dysrhythmias Ventricular Tachycardia + Cardiac Defibrillator     Neuro/Psych Anxiety  Neuromuscular disease    GI/Hepatic Neg liver ROS, GERD-  Medicated and Controlled,  Endo/Other  Diabetes mellitus-, Well Controlled, Type 2  Renal/GU Renal hypertension, ESRF and DialysisRenal disease     Musculoskeletal  (+) Arthritis -, Osteoarthritis,    Abdominal   Peds  Hematology   Anesthesia Other Findings   Reproductive/Obstetrics                        Anesthesia Physical Anesthesia Plan  ASA: IV  Anesthesia Plan: General   Post-op Pain Management:    Induction: Intravenous  Airway Management Planned: Oral ETT  Additional Equipment:   Intra-op Plan:   Post-operative Plan: Extubation in OR  Informed Consent: I have reviewed the patients History and Physical, chart, labs and discussed the procedure including the risks, benefits and alternatives for the proposed anesthesia with the patient or authorized representative who has indicated his/her understanding and acceptance.     Plan Discussed with: CRNA and Surgeon  Anesthesia Plan Comments:         Anesthesia Quick Evaluation

## 2012-04-07 ENCOUNTER — Inpatient Hospital Stay (HOSPITAL_COMMUNITY): Payer: PRIVATE HEALTH INSURANCE

## 2012-04-07 DIAGNOSIS — I10 Essential (primary) hypertension: Secondary | ICD-10-CM

## 2012-04-07 DIAGNOSIS — N186 End stage renal disease: Secondary | ICD-10-CM

## 2012-04-07 DIAGNOSIS — L97509 Non-pressure chronic ulcer of other part of unspecified foot with unspecified severity: Secondary | ICD-10-CM

## 2012-04-07 DIAGNOSIS — E119 Type 2 diabetes mellitus without complications: Secondary | ICD-10-CM

## 2012-04-07 LAB — GLUCOSE, CAPILLARY
Glucose-Capillary: 211 mg/dL — ABNORMAL HIGH (ref 70–99)
Glucose-Capillary: 154 mg/dL — ABNORMAL HIGH (ref 70–99)
Glucose-Capillary: 177 mg/dL — ABNORMAL HIGH (ref 70–99)

## 2012-04-07 LAB — RENAL FUNCTION PANEL
Albumin: 2.1 g/dL — ABNORMAL LOW (ref 3.5–5.2)
CO2: 20 mEq/L (ref 19–32)
Calcium: 8.5 mg/dL (ref 8.4–10.5)
Creatinine, Ser: 4.76 mg/dL — ABNORMAL HIGH (ref 0.50–1.35)
GFR calc Af Amer: 13 mL/min — ABNORMAL LOW (ref >90)
GFR calc non Af Amer: 11 mL/min — ABNORMAL LOW (ref >90)
Phosphorus: 8.2 mg/dL — ABNORMAL HIGH (ref 2.3–4.6)
Sodium: 135 mEq/L (ref 135–145)

## 2012-04-07 LAB — CBC
HCT: 24.6 % — ABNORMAL LOW (ref 39.0–52.0)
Hemoglobin: 7.5 g/dL — ABNORMAL LOW (ref 13.0–17.0)
MCV: 96.1 fL (ref 78.0–100.0)
RDW: 16.6 % — ABNORMAL HIGH (ref 11.5–15.5)
WBC: 12.5 10*3/uL — ABNORMAL HIGH (ref 4.0–10.5)

## 2012-04-07 MED ORDER — CLOPIDOGREL BISULFATE 75 MG PO TABS
75.0000 mg | ORAL_TABLET | Freq: Every day | ORAL | Status: DC
  Administered 2012-04-08 – 2012-04-10 (×3): 75 mg via ORAL
  Filled 2012-04-07 (×5): qty 1

## 2012-04-07 MED ORDER — TRAMADOL HCL 50 MG PO TABS
50.0000 mg | ORAL_TABLET | Freq: Three times a day (TID) | ORAL | Status: DC | PRN
  Administered 2012-04-07: 50 mg via ORAL

## 2012-04-07 MED ORDER — ENOXAPARIN SODIUM 30 MG/0.3ML ~~LOC~~ SOLN
30.0000 mg | SUBCUTANEOUS | Status: DC
  Administered 2012-04-08 – 2012-04-09 (×3): 30 mg via SUBCUTANEOUS
  Filled 2012-04-07 (×4): qty 0.3

## 2012-04-07 MED ORDER — HYDROMORPHONE HCL PF 1 MG/ML IJ SOLN
0.5000 mg | INTRAMUSCULAR | Status: DC | PRN
  Administered 2012-04-07 (×4): 0.5 mg via INTRAVENOUS
  Filled 2012-04-07 (×3): qty 1

## 2012-04-07 MED ORDER — DARBEPOETIN ALFA-POLYSORBATE 100 MCG/0.5ML IJ SOLN
INTRAMUSCULAR | Status: AC
  Administered 2012-04-07: 100 ug via INTRAVENOUS
  Filled 2012-04-07: qty 0.5

## 2012-04-07 MED ORDER — HYDROMORPHONE HCL PF 1 MG/ML IJ SOLN
INTRAMUSCULAR | Status: AC
  Administered 2012-04-07: 0.5 mg via INTRAVENOUS
  Filled 2012-04-07: qty 1

## 2012-04-07 NOTE — Progress Notes (Signed)
ANTICOAGULATION CONSULT NOTE - Initial Consult  Pharmacy Consult for Lovenox Indication: VTE prophylaxis  Allergies  Allergen Reactions  . Morphine And Related Other (See Comments)    "makes me hallucinate"  . Penicillins Rash  . Nsaids Other (See Comments)    "don't really know"   Medications:  Scheduled:    . aspirin EC  81 mg Oral Daily  . carvedilol  25 mg Oral BID WC  . clopidogrel  75 mg Oral Q breakfast  . darbepoetin (ARANESP) injection - DIALYSIS  100 mcg Intravenous Q Fri-HD  . famotidine  20 mg Oral Daily  . feeding supplement (NEPRO CARB STEADY)  237 mL Oral TID WC  . finasteride  5 mg Oral Daily  . imipenem-cilastatin  250 mg Intravenous Q12H  . insulin aspart  0-15 Units Subcutaneous TID WC  . insulin aspart  0-5 Units Subcutaneous QHS  . insulin glargine  40 Units Subcutaneous QHS  . isosorbide mononitrate  120 mg Oral QHS  . lanthanum  1,000 mg Oral TID WC  . levothyroxine  75 mcg Oral Q0600  . multivitamin  1 tablet Oral Daily  . pantoprazole  40 mg Oral QHS  . rOPINIRole  2 mg Oral QHS  . simvastatin  20 mg Oral q1800  . sodium chloride  3 mL Intravenous Q12H  . vancomycin  750 mg Intravenous Q M,W,F-HD    Assessment: 76 YOM with ESRD on HD known to pharmacy for antibiotics dosing for RLE cellulitis and diabetic ulcers, s/p AKA on 5/2. Pharmacy is consulted to start lovenox for VTE prophylaxis, will need anticogulation for 2wks post op per ortho. Hgb = 7.5, plt 332  Goal of Therapy:  Anti-Xa level = 0.36-0.65   Plan:  - Start Lovenox 30mg  sq Q 24hrs - Monitor cbc and sx of bleeding - Please consider switching to coumadin for VTE prophylaxis if discharge pt. Since pt. With ESRD, use of lovenox without anti-Xa level monitoring may have increased risk for bleeding.  Bayard Hugger, PharmD, BCPS  Clinical Pharmacist  Pager: [phone]  04/07/2012,10:46 PM

## 2012-04-07 NOTE — Progress Notes (Signed)
Subjective: 1 Day Post-Op Procedure(s) (LRB): AMPUTATION ABOVE KNEE (Right) Patient reports pain as moderate.  No n/v/f/c.  Objective: Vital signs in last 24 hours: Temp:  [96.8 F (36 C)-98.7 F (37.1 C)] 97.6 F (36.4 C) (05/02 2030) Pulse Rate:  [63-86] 86  (05/02 2030) Resp:  [12-25] 20  (05/02 2030) BP: (127-184)/(59-134) 133/76 mmHg (05/02 2030) SpO2:  [93 %-100 %] 96 % (05/02 2030) Weight:  [76.4 kg (168 lb 6.9 oz)] 76.4 kg (168 lb 6.9 oz) (05/02 2030)  Intake/Output from previous day: 05/02 0701 - 05/03 0700 In: 1510 [P.O.:60; I.V.:550; IV Piggyback:900] Out: 826 [Urine:325; Emesis/NG output:1; Blood:500] Intake/Output this shift:     Basename 04/05/12 1205  HGB 10.2*    Basename 04/05/12 1205  WBC 7.4  RBC 3.47*  HCT 32.2*  PLT 302    Basename 04/06/12 0605 04/05/12 1821  NA 137 136  K 4.1 3.9  CL 100 98  CO2 23 25  BUN 24* 12  CREATININE 3.19* 2.10*  GLUCOSE 217* 149*  CALCIUM 9.4 9.4   No results found for this basename: LABPT:2,INR:2 in the last 72 hours  wound dressed and dry.  active ff at hip 5/5.  Sens to LT intact.  Assessment/Plan: 1 Day Post-Op Procedure(s) (LRB): AMPUTATION ABOVE KNEE (Right) Up with therapy NWB on R LE. Ok to resume anticoagulation Friday.  SCDs on L LE.  Anticoagulation for 2 weeks post op. Pt will need to f/u with me in 2 weeks in clinic.  OK for discharge from ortho standpoint at any time.  Toni Arthurs 04/07/2012, 12:34 AM

## 2012-04-07 NOTE — Progress Notes (Signed)
ANTIBIOTIC CONSULT NOTE - FOLLOW UP  Pharmacy Consult for Vanc + Primaxin Indication: RLE cellulitis + diabetic ulcers  Allergies  Allergen Reactions  . Morphine And Related Other (See Comments)    "makes me hallucinate"  . Penicillins Rash  . Nsaids Other (See Comments)    "don't really know"    Patient Measurements: Height: 5\' 7"  (170.2 cm) Weight: 168 lb 6.9 oz (76.4 kg) IBW/kg (Calculated) : 66.1   Vital Signs: Temp: 97.4 F (36.3 C) (05/03 1000) Temp src: Oral (05/03 1000) BP: 137/63 mmHg (05/03 1240) Pulse Rate: 72  (05/03 1240) Intake/Output from previous day: 05/02 0701 - 05/03 0700 In: 1510 [P.O.:60; I.V.:550; IV Piggyback:900] Out: 826 [Urine:325; Emesis/NG output:1; Blood:500] Intake/Output from this shift: Total I/O In: 120 [P.O.:120] Out: -   Labs:  Basename 04/06/12 0605 04/05/12 1821 04/05/12 1207 04/05/12 1205  WBC -- -- -- 7.4  HGB -- -- -- 10.2*  PLT -- -- -- 302  LABCREA -- -- -- --  CREATININE 3.19* 2.10* 4.95* --   Estimated Creatinine Clearance: 18.7 ml/min (by C-G formula based on Cr of 3.19). No results found for this basename: VANCOTROUGH:2,VANCOPEAK:2,VANCORANDOM:2,GENTTROUGH:2,GENTPEAK:2,GENTRANDOM:2,TOBRATROUGH:2,TOBRAPEAK:2,TOBRARND:2,AMIKACINPEAK:2,AMIKACINTROU:2,AMIKACIN:2, in the last 72 hours   Microbiology: Recent Results (from the past 720 hour(s))  MRSA PCR SCREENING     Status: Abnormal   Collection Time   03/31/12  5:19 AM      Component Value Range Status Comment   MRSA by PCR POSITIVE (*) NEGATIVE  Final     Anti-infectives     Start     Dose/Rate Route Frequency Ordered Stop   04/06/12 0700   ceFAZolin (ANCEF) IVPB 1 g/50 mL premix  Status:  Discontinued        1 g 100 mL/hr over 30 Minutes Intravenous 60 min pre-op 04/05/12 1745 04/05/12 1754   04/03/12 2100   imipenem-cilastatin (PRIMAXIN) 250 mg in sodium chloride 0.9 % 100 mL IVPB        250 mg 200 mL/hr over 30 Minutes Intravenous Every 12 hours 04/03/12 1700      04/03/12 1700   vancomycin (VANCOCIN) 750 mg in sodium chloride 0.9 % 150 mL IVPB        750 mg 150 mL/hr over 60 Minutes Intravenous Every M-W-F (Hemodialysis) 04/03/12 1659     03/31/12 1200   vancomycin (VANCOCIN) 750 mg in sodium chloride 0.9 % 150 mL IVPB  Status:  Discontinued        750 mg 150 mL/hr over 60 Minutes Intravenous Every M-W-F (Hemodialysis) 03/31/12 1019 04/03/12 1619   03/31/12 0500   imipenem-cilastatin (PRIMAXIN) 500 mg in sodium chloride 0.9 % 100 mL IVPB  Status:  Discontinued        500 mg 200 mL/hr over 30 Minutes Intravenous Every 12 hours 03/30/12 2228 03/30/12 2228   03/31/12 0500   imipenem-cilastatin (PRIMAXIN) 250 mg in sodium chloride 0.9 % 100 mL IVPB  Status:  Discontinued        250 mg 200 mL/hr over 30 Minutes Intravenous Every 12 hours 03/30/12 2229 04/03/12 1619   03/31/12 0000   vancomycin (VANCOCIN) 750 mg in sodium chloride 0.9 % 150 mL IVPB        750 mg 150 mL/hr over 60 Minutes Intravenous  Once 03/30/12 2228 03/31/12 0222          Assessment: 78 y.o. M with ESRD on Day #8 of Vancomycin + Primaxin for empiric coverage of RLE cellulitis and diabetic ulcers.  Pt underwent AKA 05/02.  No cultures.  Patient remains afebrile, WBC WNL.  Spoke with Dr. Donna Bernard who is clarifying length of therapy for antibiotics. The patient receives HD MWF and is scheduled to receive HD today (5/3). Doses remain appropriate at this time.       Goal of Therapy:  Pre-HD Vancomycin level of 15-25 mcg/ml  Plan:  1. Continue Primaxin 250 mg IV every 12 hours 2. Continue Vancomycin 750 mg IV post HD MWF 3. Will continue to follow HD schedule/duration, culture results, LOT, and antibiotic de-escalation plans   [name], PharmD 04/07/2012 1:12 PM  Pager: [phone]

## 2012-04-07 NOTE — Evaluation (Signed)
Physical Therapy Evaluation Patient Details Name: [name] [name] MRN: [id number] DOB: 12/05/1934 Today's Date: 04/07/2012 Time: 7829-5621 PT Time Calculation (min): 24 min  PT Assessment / Plan / Recommendation Clinical Impression  Pt. presents s/p right AKA with pain, mobility and gait deficits and the below problems.  Will need acute PT  for ease of burden of care at next venue (SNF). He had significant pain but gave his max effort today.    PT Assessment  Patient needs continued PT services    Follow Up Recommendations  Skilled nursing facility;Supervision/Assistance - 24 hour    Equipment Recommendations  Defer to next venue    Frequency Min 3X/week    Precautions / Restrictions Precautions Precautions: Fall Restrictions Weight Bearing Restrictions: No (pt. with new AKA right LE)   Pertinent Vitals/Pain Pain in right AKA 7/10 with mobility.  RN made aware of pt's request for pain med.      Mobility  Bed Mobility Bed Mobility: Supine to Sit;Sit to Supine;Sitting - Scoot to Edge of Bed Supine to Sit: 1: +2 Total assist;With rails;HOB flat Supine to Sit: Patient Percentage: 40% Sitting - Scoot to Edge of Bed: 3: Mod assist Sit to Supine: Not Tested (comment);Other (comment) (pt. remained in recliner after PT session) Details for Bed Mobility Assistance: cues for safety, technique and use of bed pad to aid in moving Transfers Transfers: Sit to Stand;Stand to Sit;Stand Pivot Transfers Sit to Stand: 1: +2 Total assist;From bed;From elevated surface;With upper extremity assist Sit to Stand: Patient Percentage: 40% Stand to Sit: 1: +2 Total assist;Without upper extremity assist;To chair/3-in-1;With armrests Stand to Sit: Patient Percentage: 40% Stand Pivot Transfers: 1: +2 Total assist;From elevated surface Stand Pivot Transfers: Patient Percentage: 40% Details for Transfer Assistance: Pt. had 2 attempts to stand , one for transfer and one for personal care of buttocks.  Pt  had been iincontinent of diarrhea and possibly urine. Ambulation/Gait Ambulation/Gait Assistance: Not tested (comment);Other (comment) (pt. not able)    Exercises Amputee Exercises Gluteal Sets: AROM;Right;5 reps;Supine   PT Goals Acute Rehab PT Goals PT Goal Formulation: With patient Time For Goal Achievement: 04/21/12 Potential to Achieve Goals: Good Pt will Roll Supine to Right Side: with min assist PT Goal: Rolling Supine to Right Side - Progress: Goal set today Pt will Roll Supine to Left Side: with min assist PT Goal: Rolling Supine to Left Side - Progress: Goal set today Pt will go Supine/Side to Sit: with min assist PT Goal: Supine/Side to Sit - Progress: Goal set today Pt will Sit at Edge of Bed: with modified independence;with no upper extremity support;3-5 min PT Goal: Sit at [place] Of Bed - Progress: Goal set today Pt will go Sit to Supine/Side: with min assist PT Goal: Sit to Supine/Side - Progress: Goal set today Pt will go Sit to Stand: with min assist PT Goal: Sit to Stand - Progress: Goal set today Pt will go Stand to Sit: with min assist PT Goal: Stand to Sit - Progress: Goal set today Pt will Transfer Bed to Chair/Chair to Bed: with mod assist PT Transfer Goal: Bed to Chair/Chair to Bed - Progress: Goal set today Pt will Stand: with mod assist PT Goal: Stand - Progress: Goal set today Pt will Ambulate: 1 - 15 feet;with rolling walker;with mod assist PT Goal: Ambulate - Progress: Goal set today Pt will Perform Home Exercise Program: with min assist PT Goal: Perform Home Exercise Program - Progress: Goal set today  Visit Information  Last PT  Received On: 04/07/12 Assistance Needed: +2    Subjective Data  Subjective: I walked at [facility] with one person help Patient Stated Goal: Return to [facility] in [place] for rehab   Prior Functioning  Home Living Lives With: Other (Comment) (at SNF) Available Help at Discharge: Skilled Nursing  Facility;Available 24 hours/day Type of Home: Skilled Nursing Facility Prior Function Level of Independence: Needs assistance Needs Assistance: Grooming;Toileting;Gait;Transfers Toileting: Moderate Gait Assistance: one person help for use of RW Transfer Assistance: one person help for transfers Driving: No 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 difficulties    Cognition  Overall Cognitive Status: Appears within functional limits for tasks assessed/performed Arousal/Alertness: Awake/alert Orientation Level: Appears intact for tasks assessed Behavior During Session: [facility] for tasks performed    Extremity/Trunk Assessment Right Upper Extremity Assessment RUE ROM/Strength/Tone: [facility] for tasks assessed (defer to OT) Left Upper Extremity Assessment LUE ROM/Strength/Tone: [facility] for tasks assessed (defer to OT) Right Lower Extremity Assessment RLE ROM/Strength/Tone: Deficits;Due to pain RLE ROM/Strength/Tone Deficits: right residual limb held in 30 flex at hip for comfort. Educated pt in need for full hip extension and in glut sets Left Lower Extremity Assessment LLE ROM/Strength/Tone: Deficits LLE ROM/Strength/Tone Deficits: sdtrength grossly 4-/5 LLE Sensation: History of peripheral neuropathy LLE Coordination: WFL - gross motor Trunk Assessment Trunk Assessment: Normal   Balance Balance Balance Assessed: Yes Static Sitting Balance Static Sitting - Balance Support: Right upper extremity supported;Left upper extremity supported;Feet supported Static Sitting - Level of Assistance: 3: Mod assist;5: Stand by assistance;Other (comment) (initially mod assist progressing to stand by assist) Static Sitting - Comment/# of Minutes: 5  End of Session PT - End of Session Equipment Utilized During Treatment: Gait belt Activity Tolerance: Patient limited by fatigue;Patient limited by pain Patient left: in chair;with call bell/phone within reach Nurse Communication: Mobility status;Other  (comment) (discussed with Denny Peon, RN)   Ferman Hamming 04/07/2012, 12:19 PM Acute Rehabilitation Services [phone] (pager)

## 2012-04-07 NOTE — Progress Notes (Signed)
Subjective:  Currently having post-op pain and awaiting pain meds; tolerating diet and having BM's  Vital signs in last 24 hours: Filed Vitals:   04/06/12 1432 04/06/12 1447 04/06/12 2030 04/07/12 0601  BP:  178/79 133/76 142/62  Pulse: 65 66 86 74  Temp:  97.3 F (36.3 C) 97.6 F (36.4 C) 97.7 F (36.5 C)  TempSrc:  Oral Oral Oral  Resp: 12 20 20 19   Height:      Weight:   76.4 kg (168 lb 6.9 oz)   SpO2: 98% 100% 96% 95%   Weight change: -2.1 kg (-4 lb 10.1 oz)  Intake/Output Summary (Last 24 hours) at 04/07/12 1029 Last data filed at 04/07/12 0900  Gross per 24 hour  Intake   1630 ml  Output    526 ml  Net   1104 ml   Labs: Basic Metabolic Panel:  Lab 04/06/12 1610 04/05/12 1821 04/05/12 1207 04/03/12 0600 04/02/12 0653  NA 137 136 130* -- --  K 4.1 3.9 3.5 -- --  CL 100 98 93* -- --  CO2 23 25 24  -- --  GLUCOSE 217* 149* 153* -- --  BUN 24* 12 45* -- --  CREATININE 3.19* 2.10* 4.95* -- --  CALCIUM 9.4 9.4 9.1 -- --  ALB -- -- -- -- --  PHOS -- -- 5.9* 5.5* 5.0*   Liver Function Tests:  Lab 04/05/12 1207 04/03/12 0600 04/02/12 0653  AST -- -- --  ALT -- -- --  ALKPHOS -- -- --  BILITOT -- -- --  PROT -- -- --  ALBUMIN 2.2* 2.1* 2.2*   No results found for this basename: LIPASE:3,AMYLASE:3 in the last 168 hours No results found for this basename: AMMONIA:3 in the last 168 hours CBC:  Lab 04/05/12 1205 04/03/12 1700 04/03/12 0600 04/02/12 0653 04/01/12 0530  WBC 7.4 9.0 6.1 -- --  NEUTROABS -- -- -- -- --  HGB 10.2* 11.9* 10.4* -- --  HCT 32.2* 36.3* 32.9* -- --  MCV 92.8 89.9 93.2 94.8 95.9  PLT 302 298 280 -- --   Cardiac Enzymes: No results found for this basename: CKTOTAL:5,CKMB:5,CKMBINDEX:5,TROPONINI:5 in the last 168 hours CBG:  Lab 04/07/12 0747 04/06/12 2133 04/06/12 1913 04/06/12 1647 04/06/12 1449  GLUCAP 217* 251* 279* 249* 216*    Iron Studies: No results found for this basename: IRON,TIBC,TRANSFERRIN,FERRITIN in the last 72  hours Studies/Results: No results found. Medications:    . DISCONTD: sodium chloride 50 mL/hr at 04/06/12 1044  . DISCONTD: sodium chloride        . aspirin EC  81 mg Oral Daily  . carvedilol  25 mg Oral BID WC  . darbepoetin (ARANESP) injection - DIALYSIS  100 mcg Intravenous Q Fri-HD  . famotidine  20 mg Oral Daily  . feeding supplement (NEPRO CARB STEADY)  237 mL Oral TID WC  . finasteride  5 mg Oral Daily  . imipenem-cilastatin  250 mg Intravenous Q12H  . insulin aspart  0-15 Units Subcutaneous TID WC  . insulin aspart  0-5 Units Subcutaneous QHS  . insulin glargine  40 Units Subcutaneous QHS  . isosorbide mononitrate  120 mg Oral QHS  . lanthanum  1,000 mg Oral TID WC  . levothyroxine  75 mcg Oral Q0600  . multivitamin  1 tablet Oral Daily  . pantoprazole  40 mg Oral QHS  . rOPINIRole  2 mg Oral QHS  . simvastatin  20 mg Oral q1800  . sodium chloride  3 mL  Intravenous Q12H  . vancomycin  750 mg Intravenous Q M,W,F-HD    I  have reviewed scheduled and prn medications.  Physical Exam: General: uncomfortable Heart: RRR Lungs: No adventitious BS; pacer left chest Abdomen: soft, NT, ND, +BS Extremities: bulky dressing right AKA stump, no LLE edema Dialysis Access: RIJ PC; poorly maturing RUA AVF, +T/B   Problem/Plan: 1. S/P Right AKA- on Vancomycin and Primaxin IV, ortho following; cleared for discharge from their standpoint (f/u with them in 2 wks) 2. ESRD - MWF [place]; sched HD today; using RIJ catheter; has maturing AVF 3. Hypertension/volume - controlled on meds and UF with HD; will need new eDW after AKA; follow 4. Anemia - Hgb 10.2; ^ risk post-op ABLA; ESA now resumed; to be given today; follow closely 5. Metabolic bone disease -phos 5.9;  Fosrenol resumed; avoid calcium binders; cont follow 6. Nutrition - Alb 2.2; now eating; ^ protein renal diet and suppl; follow   7. CAD - s/p CABG and stenting- previously on ASA, Plavix, Coreg and Imdur; anticoagulation 2  wk postop per ortho.  8. DM - CBG and SSI 9. Disposition- per primary services; noting ortho note; suspect outpt rehab [facility] PT/OT ([facility])     Samuel Germany, FNP-C [facility] Pager [phone]  04/07/2012,10:29 AM  LOS: 8 days

## 2012-04-07 NOTE — Progress Notes (Signed)
I have seen and examined this patient and agree with the assessment/plan as outlined above by Lyda Perone NP. Dealing with expected post amputation pain- tolerating hemodialysis at this time. Shanoah Asbill K.,MD 04/07/2012 1:59 PM

## 2012-04-07 NOTE — Procedures (Signed)
I was present at this session.  I have reviewed the session itself and made appropriate changes.  Sharma Lawrance K. 5/3/20131:59 PM

## 2012-04-07 NOTE — Progress Notes (Signed)
Utilization review complete 

## 2012-04-07 NOTE — Progress Notes (Addendum)
Subjective: Reports increased pain earlier today, also c/osome nausea  Objective: Vital signs in last 24 hours: Temp:  [97 F (36.1 C)-98.6 F (37 C)] 98.6 F (37 C) (05/03 1755) Pulse Rate:  [69-97] 88  (05/03 1755) Resp:  [14-22] 20  (05/03 1755) BP: (76-144)/(35-76) 103/60 mmHg (05/03 1755) SpO2:  [92 %-98 %] 98 % (05/03 1755) Weight:  [72.3 kg (159 lb 6.3 oz)-76.4 kg (168 lb 6.9 oz)] 72.3 kg (159 lb 6.3 oz) (05/03 1616) Weight change: -2.1 kg (-4 lb 10.1 oz) Last BM Date: 04/07/12  Intake/Output from previous day: 05/02 0701 - 05/03 0700 In: 1510 [P.O.:60; I.V.:550; IV Piggyback:900] Out: 826 [Urine:325; Emesis/NG output:1; Blood:500]     Physical Exam: General: Alert and oriented x3 not short of breath at rest.  HEENT:  Sclera anicteric, no conjunctival injection or discharge. NECK:  Supple, JVP not seen, no carotid bruits, no palpable lymphadenopathy, no palpable goiter. CHEST: clear to auscultation, no wheezes, no crackles. HEART:  normal S1-S2, regular, no murmurs. ABDOMEN:  Moderately obese, soft, non-tender, no palpable organomegaly, no palpable masses, normal bowel sounds. GENITALIA:  Not examined. LOWER EXTREMITIES:  Right AKA stump with dressing clean and dry, LLE with SCD CENTRAL NERVOUS SYSTEM:  No focal neurologic deficit on gross examination.  Lab Results:  Basename 04/07/12 1257 04/05/12 1205  WBC 12.5* 7.4  HGB 7.5* 10.2*  HCT 24.6* 32.2*  PLT 332 302    Basename 04/07/12 1256 04/06/12 0605  NA 135 137  K 4.3 4.1  CL 98 100  CO2 20 23  GLUCOSE 219* 217*  BUN 45* 24*  CREATININE 4.76* 3.19*  CALCIUM 8.5 9.4   Recent Results (from the past 240 hour(s))  MRSA PCR SCREENING     Status: Abnormal   Collection Time   03/31/12  5:19 AM      Component Value Range Status Comment   MRSA by PCR POSITIVE (*) NEGATIVE  Final      Studies/Results: No results found.  Medications: Scheduled Meds:    . aspirin EC  81 mg Oral Daily  . carvedilol   25 mg Oral BID WC  . darbepoetin (ARANESP) injection - DIALYSIS  100 mcg Intravenous Q Fri-HD  . famotidine  20 mg Oral Daily  . feeding supplement (NEPRO CARB STEADY)  237 mL Oral TID WC  . finasteride  5 mg Oral Daily  . imipenem-cilastatin  250 mg Intravenous Q12H  . insulin aspart  0-15 Units Subcutaneous TID WC  . insulin aspart  0-5 Units Subcutaneous QHS  . insulin glargine  40 Units Subcutaneous QHS  . isosorbide mononitrate  120 mg Oral QHS  . lanthanum  1,000 mg Oral TID WC  . levothyroxine  75 mcg Oral Q0600  . multivitamin  1 tablet Oral Daily  . pantoprazole  40 mg Oral QHS  . rOPINIRole  2 mg Oral QHS  . simvastatin  20 mg Oral q1800  . sodium chloride  3 mL Intravenous Q12H  . vancomycin  750 mg Intravenous Q M,W,F-HD   Continuous Infusions:   PRN Meds:.sodium chloride, acetaminophen, ALPRAZolam, hydrALAZINE, hydrocortisone cream, HYDROmorphone, LORazepam, ondansetron (ZOFRAN) IV, sodium chloride, traMADol, DISCONTD: fentaNYL, DISCONTD:  HYDROmorphone (DILAUDID) injection, DISCONTD: midazolam, DISCONTD: traMADol  Assessment/Plan:  Principal Problem:  *Foot ulcer due to secondary DM: Patient presented with an obviously infected lage and deep RLE wound, which he says has been present only for 2 weeks, without antecedent trauma. He also has a more chronic right heel wound. He is  now on Vancmycin/Primaxin day#8. Dr Victorino Dike provided orthopedic consultation. Unfortunately, MRI was not feasible, due to presence of a pacer.  Dr Victorino Dike felt that evaluation of peripheral arterial circulation is indicated. Dr Hart Rochester provided vascular surgical consultation, and patient underwent arteriogram on 04/03/12. > per Vasc pt was offered an orbital atherectomy and angioplasty of right femoropopliteal stenosis. He declined and decided to proceed with R AKA. -S/P AKA today 5/2, appreciate Vasc and ortho assistance. - per ortho needs anticogulation for 2wks post op, will start prophylactic  lovenox - will discuss further antibiotics with ortho and treat accordingly - bump in wbc noted, will recheck in am Active Problems:  1. Cellulitis: See above.  2. ESRD (end stage renal disease): Patient is on HD, M-W-F.  -dialysis per renal  3. DM2 (diabetes mellitus, type 2):-continue Lantus, SSI   4. HTN (hypertension):  BP stable/elevated off  Norvasc, Coreg, Benicar and Lasix discontinued on 4/30 secondary to hypotension. -continue Coreg only for now(with hold parameters) 5.H/O MI - will resume plavix, per ortho ok to restart.  LOS: 8 days   Roselle Norton C 04/07/2012, 8:29 PM

## 2012-04-08 DIAGNOSIS — L97509 Non-pressure chronic ulcer of other part of unspecified foot with unspecified severity: Secondary | ICD-10-CM

## 2012-04-08 DIAGNOSIS — I959 Hypotension, unspecified: Secondary | ICD-10-CM

## 2012-04-08 DIAGNOSIS — E119 Type 2 diabetes mellitus without complications: Secondary | ICD-10-CM

## 2012-04-08 DIAGNOSIS — N186 End stage renal disease: Secondary | ICD-10-CM

## 2012-04-08 LAB — BASIC METABOLIC PANEL
BUN: 20 mg/dL (ref 6–23)
CO2: 20 mEq/L (ref 19–32)
Chloride: 97 mEq/L (ref 96–112)
GFR calc Af Amer: 26 mL/min — ABNORMAL LOW (ref >90)
Potassium: 3.6 mEq/L (ref 3.5–5.1)

## 2012-04-08 LAB — OCCULT BLOOD X 1 CARD TO LAB, STOOL: Fecal Occult Bld: NEGATIVE

## 2012-04-08 LAB — CBC
HCT: 23.4 % — ABNORMAL LOW (ref 39.0–52.0)
Hemoglobin: 7.2 g/dL — ABNORMAL LOW (ref 13.0–17.0)
MCH: 28.5 pg (ref 26.0–34.0)
MCHC: 29.5 g/dL — ABNORMAL LOW (ref 30.0–36.0)
Platelets: 182 10*3/uL (ref 150–400)
RDW: 17 % — ABNORMAL HIGH (ref 11.5–15.5)
WBC: 8.8 10*3/uL (ref 4.0–10.5)

## 2012-04-08 LAB — GLUCOSE, CAPILLARY
Glucose-Capillary: 190 mg/dL — ABNORMAL HIGH (ref 70–99)
Glucose-Capillary: 170 mg/dL — ABNORMAL HIGH (ref 70–99)

## 2012-04-08 LAB — PREPARE RBC (CROSSMATCH)

## 2012-04-08 MED ORDER — SODIUM CHLORIDE 0.9 % IV BOLUS (SEPSIS)
250.0000 mL | Freq: Once | INTRAVENOUS | Status: AC
  Administered 2012-04-08: 250 mL via INTRAVENOUS

## 2012-04-08 MED ORDER — CARVEDILOL 6.25 MG PO TABS
6.2500 mg | ORAL_TABLET | Freq: Two times a day (BID) | ORAL | Status: DC
Start: 2012-04-08 — End: 2012-04-08
  Filled 2012-04-08 (×2): qty 1

## 2012-04-08 MED ORDER — SODIUM CHLORIDE 0.9 % IV BOLUS (SEPSIS): 250.0000 mL | Freq: Once | INTRAVENOUS | Status: DC

## 2012-04-08 MED ORDER — CARVEDILOL 12.5 MG PO TABS
12.5000 mg | ORAL_TABLET | Freq: Two times a day (BID) | ORAL | Status: DC
  Filled 2012-04-08 (×3): qty 1

## 2012-04-08 MED ORDER — METHOCARBAMOL 500 MG PO TABS
500.0000 mg | ORAL_TABLET | Freq: Three times a day (TID) | ORAL | Status: DC | PRN
  Administered 2012-04-08 – 2012-04-10 (×4): 500 mg via ORAL
  Filled 2012-04-08 (×4): qty 1

## 2012-04-08 NOTE — Progress Notes (Signed)
Subjective:  Hypotensive during the night; Hgb 7.2 and scheduled for transfusion; pain better today following Ultram but nauseated and unable to eat; had a good BM this am   Vital signs in last 24 hours: Filed Vitals:   04/08/12 0635 04/08/12 0816 04/08/12 0821 04/08/12 0924  BP: 90/55 84/44 88/48  88/42  Pulse:  80    Temp:  97.8 F (36.6 C)    TempSrc:  Oral    Resp:  20    Height:      Weight:      SpO2:  100%     Weight change: -3.3 kg (-7 lb 4.4 oz)  Intake/Output Summary (Last 24 hours) at 04/08/12 0947 Last data filed at 04/07/12 2300  Gross per 24 hour  Intake    790 ml  Output    552 ml  Net    238 ml   Labs: Basic Metabolic Panel:  Lab 04/07/12 1779 04/06/12 0605 04/05/12 1821 04/05/12 1207 04/03/12 0600  NA 135 137 136 -- --  K 4.3 4.1 3.9 -- --  CL 98 100 98 -- --  CO2 20 23 25  -- --  GLUCOSE 219* 217* 149* -- --  BUN 45* 24* 12 -- --  CREATININE 4.76* 3.19* 2.10* -- --  CALCIUM 8.5 9.4 9.4 -- --  ALB -- -- -- -- --  PHOS 8.2* -- -- 5.9* 5.5*   Liver Function Tests:  Lab 04/07/12 1256 04/05/12 1207 04/03/12 0600  AST -- -- --  ALT -- -- --  ALKPHOS -- -- --  BILITOT -- -- --  PROT -- -- --  ALBUMIN 2.1* 2.2* 2.1*   No results found for this basename: LIPASE:3,AMYLASE:3 in the last 168 hours No results found for this basename: AMMONIA:3 in the last 168 hours CBC:  Lab 04/08/12 0856 04/08/12 0500 04/07/12 1257 04/05/12 1205 04/03/12 1700  WBC 8.8 5.6 12.5* -- --  NEUTROABS -- -- -- -- --  HGB 7.2* 4.1* 7.5* -- --  HCT 23.4* 13.9* 24.6* -- --  MCV 96.3 96.5 96.1 92.8 89.9  PLT 290 182 332 -- --   Cardiac Enzymes: No results found for this basename: CKTOTAL:5,CKMB:5,CKMBINDEX:5,TROPONINI:5 in the last 168 hours CBG:  Lab 04/08/12 0749 04/07/12 2119 04/07/12 1658 04/07/12 1203 04/07/12 0747  GLUCAP 193* 177* 154* 211* 217*    Iron Studies: No results found for this basename: IRON,TIBC,TRANSFERRIN,FERRITIN in the last 72  hours Studies/Results: No results found. Medications:      . aspirin EC  81 mg Oral Daily  . carvedilol  6.25 mg Oral BID WC  . clopidogrel  75 mg Oral Q breakfast  . darbepoetin (ARANESP) injection - DIALYSIS  100 mcg Intravenous Q Fri-HD  . enoxaparin (LOVENOX) injection  30 mg Subcutaneous Q24H  . famotidine  20 mg Oral Daily  . feeding supplement (NEPRO CARB STEADY)  237 mL Oral TID WC  . finasteride  5 mg Oral Daily  . imipenem-cilastatin  250 mg Intravenous Q12H  . insulin aspart  0-15 Units Subcutaneous TID WC  . insulin aspart  0-5 Units Subcutaneous QHS  . insulin glargine  40 Units Subcutaneous QHS  . isosorbide mononitrate  120 mg Oral QHS  . lanthanum  1,000 mg Oral TID WC  . levothyroxine  75 mcg Oral Q0600  . multivitamin  1 tablet Oral Daily  . pantoprazole  40 mg Oral QHS  . rOPINIRole  2 mg Oral QHS  . simvastatin  20 mg Oral q1800  .  sodium chloride  250 mL Intravenous Once  . sodium chloride  3 mL Intravenous Q12H  . vancomycin  750 mg Intravenous Q M,W,F-HD  . DISCONTD: carvedilol  12.5 mg Oral BID WC  . DISCONTD: carvedilol  25 mg Oral BID WC  . DISCONTD: sodium chloride  250 mL Intravenous Once    I  have reviewed scheduled and prn medications.  Physical Exam:  General: uncomfortable  Heart: RRR  Lungs: No adventitious BS; pacer left chest  Abdomen: soft, NT, ND, +BS  Extremities: bulky dressing right AKA stump, no LLE edema  Dialysis Access: RIJ PC  Problem/Plan:  1. S/P Right AKA- on Vancomycin and Primaxin IV, ortho following; cleared for discharge from their standpoint (f/u with them in 2 wks) 2. ESRD - MWF [place]; HD yesterday continue using RIJ catheter 3. Hypertension/volume - hypotensive during the night suspect secondary to pain meds and low Hgb; improved after NS bolus; hold BP meds;(still will need new eDW after AKA; follow closely 4. Anemia - Hgb now 7.5, post-op ABLA; ESA given yesterday; scheduled transfusion 1 unit PRBC's today;  follow closely 5. Metabolic bone disease -phos now 8.2; ^ Fosrenol; avoid calcium binders; cont follow 6. Nutrition - Alb 2.1; nauseated this am; intake poor; cont ^ protein renal diet and suppl; follow  7. CAD - s/p CABG and stenting- previously on ASA, Plavix, Coreg and Imdur; anticoagulation 2 wk postop per ortho.  8. DM - CBG and SSI 9. Disposition- per primary services; noting ortho note; suspect outpt rehab [facility] PT/OT (NWB); needs medical stabilization (low BP and Hgb's) prior   [name] Germany, FNP-C [facility] Pager [phone]  04/08/2012,9:47 AM  LOS: 9 days

## 2012-04-08 NOTE — Progress Notes (Signed)
Occupational Therapy Note  OT order received and appreciated.  Noted pt is resident of [facility] and plans to return to SNF upon discharge.  PTA pt required assist with BADLs and transfers. Will defer OT eval to next venue of care. Signing off. Thanks.  04/08/2012 Cipriano Mile OTR/L Pager [phone] Office [phone]

## 2012-04-08 NOTE — Progress Notes (Signed)
Received critical lab - Hgb 4.1/Hct 13.9.  Patient turned to check for any source of bleeding.  Still no frank red blood or pooling underneath patient.  He does has dark small smear stain on back of ace bandage towards buttocks.  He is still c/o right stump pain - rating as 7/10.  He does have red non blanchable area to right side of scrotum.  He does have what appears to be Stage II to right and left buttock.  Unable to measure at this time because of patient's intolerance to being turned.  Received order to d/c 2nd 250 cc NS Bolus.  Received order for T&S and infuse one unit of blood.  Manual BP was retaken and is now 90/55.  Will continue to monitor patient.  Stacie Glaze 04/08/2012

## 2012-04-08 NOTE — Progress Notes (Signed)
Patient's manual BP is 80/42.  He is still c/o right stump pain of 7/10.  Per MD, will give another 250 cc NS Bolus.  Call MD if BP is still less than 90 SBP.  Coreg dose reduced.  Will continue to monitor patient.  Niemah Schwebke, Justine Null. 04/08/2012

## 2012-04-08 NOTE — Progress Notes (Signed)
Patient c/o right stump pain.  He has been given 50 mg PO Ultram, 0.25 mg PO Alprazolam, 650 mg PO Tylenol with no relief.  Currently rating pain at 7/10 and is moaning in pain.  Per report at shift change, Dilaudid 0.5 mg IV was d/c'd earlier today secondary to decreased BP.  Per pharmacy, Ultram 50mg  can be given a maximum of 4 times in a 24 hour period.  Per Richarda Overlie PA-an order for Robaxin 500mg  PO Q 8 hours PRN was entered.  Will give to patient and continue to monitor for pain relief.  Stacie Glaze 04/08/2012

## 2012-04-08 NOTE — Progress Notes (Signed)
CRITICAL VALUE ALERT  Critical value received:  Hgb 4.1/Hct 13.9  Date of notification:  04/08/2012  Time of notification:  0630  Critical value read back:yes  Nurse who received alert:  Stacie Glaze   MD notified (1st page):  Lenny Pastel NP  Time of first page:  0630  MD notified (2nd page):  Time of second page:  Responding MD:  Lenny Pastel NP  Time MD responded:  [phone]

## 2012-04-08 NOTE — Progress Notes (Signed)
Subjective: 2 Days Post-Op Procedure(s) (LRB): AMPUTATION ABOVE KNEE (Right) Patient reports pain as mild.    Objective: Vital signs in last 24 hours: Temp:  [97 F (36.1 C)-98.6 F (37 C)] 98.1 F (36.7 C) (05/04 1040) Pulse Rate:  [69-97] 74  (05/04 1040) Resp:  [14-22] 18  (05/04 1040) BP: (76-144)/(35-75) 97/60 mmHg (05/04 1040) SpO2:  [94 %-100 %] 100 % (05/04 0816) Weight:  [72.3 kg (159 lb 6.3 oz)-73.1 kg (161 lb 2.5 oz)] 72.3 kg (159 lb 6.3 oz) (05/03 2046)  Intake/Output from previous day: 05/03 0701 - 05/04 0700 In: 910 [P.O.:340; I.V.:20; IV Piggyback:550] Out: 553 [Stool:1] Intake/Output this shift:     Basename 04/08/12 0856 04/08/12 0500 04/07/12 1257 04/05/12 1205  HGB 7.2* 4.1* 7.5* 10.2*    Basename 04/08/12 0856 04/08/12 0500  WBC 8.8 5.6  RBC 2.43* 1.44*  HCT 23.4* 13.9*  PLT 290 182    Basename 04/08/12 0856 04/07/12 1256  NA 133* 135  K 3.6 4.3  CL 97 98  CO2 20 20  BUN 20 45*  CREATININE 2.65* 4.76*  GLUCOSE 189* 219*  CALCIUM 8.2* 8.5   No results found for this basename: LABPT:2,INR:2 in the last 72 hours  Incision: dressing C/D/I  Assessment/Plan: 2 Days Post-Op Procedure(s) (LRB): AMPUTATION ABOVE KNEE (Right) Up with therapy Stable orthopaedically for d/c  [name] [name] A 04/08/2012, 11:41 AM

## 2012-04-08 NOTE — Progress Notes (Signed)
Patient is c/o persistent severe Rt stump pain.  Still rating pain as 7/10.  Stump inspected and there is slight drainage to back of dressing.  Manual BP is 84/58.  Per [facility], will give 250 cc NS bolus, recheck BP, and call MD back with new BP.  Unable to adjust pain medication at this time 2ndary to decreased BP.  Will continue to monitor patient.  Stacie Glaze 04/08/2012

## 2012-04-08 NOTE — Progress Notes (Signed)
Subjective: Called per and this a.m. to see patient secondary to persisting had the hypotension - patient asymptomatic, denies chest pain, no shortness of breath  Objective: Vital signs in last 24 hours: Temp:  [97 F (36.1 [name])-98.6 F (37 [name])] 98.1 F (36.7 [name]) (05/04 1040) Pulse Rate:  [69-97] 74  (05/04 1040) Resp:  [14-22] 18  (05/04 1040) BP: (76-144)/(35-75) 97/60 mmHg (05/04 1040) SpO2:  [94 %-100 %] 100 % (05/04 0816) Weight:  [72.3 kg (159 lb 6.3 oz)-73.1 kg (161 lb 2.5 oz)] 72.3 kg (159 lb 6.3 oz) (05/03 2046) Weight change: -3.3 kg (-7 lb 4.4 oz) Last BM Date: 04/07/12  Intake/Output from previous day: 05/03 0701 - 05/04 0700 In: 910 [P.O.:340; I.V.:20; IV Piggyback:550] Out: 553 [Stool:1]     Physical Exam: General: Alert and oriented x3 not short of breath at rest.  HEENT:  Sclera anicteric, no conjunctival injection or discharge. NECK:  Supple, JVP not seen, no carotid bruits, no palpable lymphadenopathy, no palpable goiter. CHEST: clear to auscultation, no wheezes, no crackles. HEART:  normal S1-S2, regular, no murmurs. ABDOMEN:  Moderately obese, soft, non-tender, no palpable organomegaly, no palpable masses, normal bowel sounds. GENITALIA:  Not examined. LOWER EXTREMITIES:  Right AKA stump with dressing remains clean and dry, LLE with SCD CENTRAL NERVOUS SYSTEM:  No focal neurologic deficit on gross examination.  Lab Results:  Basename 04/08/12 0856 04/08/12 0500  WBC 8.8 5.6  HGB 7.2* 4.1*  HCT 23.4* 13.9*  PLT 290 182    Basename 04/08/12 0856 04/07/12 1256  NA 133* 135  K 3.6 4.3  CL 97 98  CO2 20 20  GLUCOSE 189* 219*  BUN 20 45*  CREATININE 2.65* 4.76*  CALCIUM 8.2* 8.5   Recent Results (from the past 240 hour(s))  MRSA PCR SCREENING     Status: Abnormal   Collection Time   03/31/12  5:19 AM      Component Value Range Status Comment   MRSA by PCR POSITIVE (*) NEGATIVE  Final      Studies/Results: No results  found.  Medications: Scheduled Meds:    . aspirin EC  81 mg Oral Daily  . carvedilol  6.25 mg Oral BID WC  . clopidogrel  75 mg Oral Q breakfast  . darbepoetin (ARANESP) injection - DIALYSIS  100 mcg Intravenous Q Fri-HD  . enoxaparin (LOVENOX) injection  30 mg Subcutaneous Q24H  . famotidine  20 mg Oral Daily  . feeding supplement (NEPRO CARB STEADY)  237 mL Oral TID WC  . finasteride  5 mg Oral Daily  . imipenem-cilastatin  250 mg Intravenous Q12H  . insulin aspart  0-15 Units Subcutaneous TID WC  . insulin aspart  0-5 Units Subcutaneous QHS  . insulin glargine  40 Units Subcutaneous QHS  . isosorbide mononitrate  120 mg Oral QHS  . lanthanum  1,000 mg Oral TID WC  . levothyroxine  75 mcg Oral Q0600  . multivitamin  1 tablet Oral Daily  . pantoprazole  40 mg Oral QHS  . rOPINIRole  2 mg Oral QHS  . simvastatin  20 mg Oral q1800  . sodium chloride  250 mL Intravenous Once  . sodium chloride  3 mL Intravenous Q12H  . vancomycin  750 mg Intravenous Q M,W,F-HD  . DISCONTD: carvedilol  12.5 mg Oral BID WC  . DISCONTD: carvedilol  25 mg Oral BID WC  . DISCONTD: sodium chloride  250 mL Intravenous Once   Continuous Infusions:   PRN Meds:.sodium  chloride, acetaminophen, ALPRAZolam, hydrALAZINE, hydrocortisone cream, methocarbamol, ondansetron (ZOFRAN) IV, sodium chloride, traMADol, DISCONTD:  HYDROmorphone (DILAUDID) injection, DISCONTD: traMADol  Assessment/Plan:  Principal Problem:  *Foot ulcer due to secondary DM: Patient presented with an obviously infected lage and deep RLE wound, which he says has been present only for 2 weeks, without antecedent trauma. He also has a more chronic right heel wound. He is now on Vancmycin/Primaxin day#9. Dr Victorino Dike provided orthopedic consultation. Unfortunately, MRI was not feasible, due to presence of a pacer.  Dr Victorino Dike felt that evaluation of peripheral arterial circulation is indicated. Dr Hart Rochester provided vascular surgical consultation, and  patient underwent arteriogram on 04/03/12. > per Vasc pt was offered an orbital atherectomy and angioplasty of right femoropopliteal stenosis. He declined and decided to proceed with R AKA. -S/P AKA today 5/2, appreciate Vasc and ortho assistance. - per ortho needs anticogulation for 2wks post op, on  prophylactic lovenox started 5/3 - discussed further antibiotics with ortho and per orthopedics no further antibiotics recommended-will DC abx  And follow Active Problems:  1. Cellulitis: See above.  2. ESRD (end stage renal disease): Patient is on HD, M-W-F.  -dialysis per renal  3. DM2 (diabetes mellitus, type 2):-continue Lantus, SSI   4. hypotension:  Status post fluid boluses overnight- likely related to fluid removal with dialysis on 5/3, we'll monitor and further treat accordingly-unit of packed red blood cells ordered earlier on, will monitor and further treat accordingly. -DC coreg -No evidence of gross bleeding and repeat hemoglobin. - Coreg, Benicar and Lasix  Had had to be discontinued 4/30 secondary to hypotension as well but Bp susbsequently stabilized and coreg was restarted. 5.H/O MI - on plavix. 6. Anemia -initial CBC drawn this a.m.from the line and  was done while patient was receiving IV fluids through it. -Repeat hemoglobin 7.2, no gross evidence of bleeding.  LOS: 9 days   [name] [name] 04/08/2012, 10:49 AM

## 2012-04-08 NOTE — Significant Event (Addendum)
Rapid Response Event Note  Overview:  called to see patient for low BP and questionable overnight drop in HGB    Initial Focused Assessment:  Upon arrival patient is pale, alert and oriented. Received report from RN regarding Hypotension and HGB trends. Current manual BP in 80's. Patient has received a 250 cc bolus for BP during night and then had labs drawn so there is a question about dilutional results. Nurse reports pain management has been intentional with consideration of soft BPs. All BP meds dc'd by MD yesterday. Additionally, foley catheter urine bag displays tea colored urine; urin in tubing is paler than that in bag. No obvious bleeding from rectum, on stump dressing.  Interventions: Assisted with bedside care; contacted lab to assist with expediting redraw of CBC. Performed mouth care and patient is alert, cooperative.    Event Summary: Pt color improved with changes in position and care. MD arrived at the bedside. Lab redrew CBC in left arm-IV currently locked with no fluids infusing. Will assist as needed.      Rechecked at approx 1030: Hgb 7.2, receiving blood.       at          Kristine Linea

## 2012-04-08 NOTE — Progress Notes (Signed)
PT Cancellation Note  Treatment cancelled today due to medical issues with patient which prohibited therapy. Patient with decreased HGB, Receiving blood at this time. Will follow up when patients HGB more medically stable per department protocol.  04/08/2012 Fredrich Birks PTA [phone] pager [phone] office     Fredrich Birks 04/08/2012, 1:07 PM

## 2012-04-08 NOTE — Progress Notes (Signed)
I have seen and examined this patient and agree with the assessment/plan as outlined above by Lyda Perone NP. Plan for HD Monday- no acute needs noted today. Will monitor for need for PRBCs. Poetry Cerro K.,MD 04/08/2012 10:03 AM

## 2012-04-09 DIAGNOSIS — E119 Type 2 diabetes mellitus without complications: Secondary | ICD-10-CM

## 2012-04-09 DIAGNOSIS — L97509 Non-pressure chronic ulcer of other part of unspecified foot with unspecified severity: Secondary | ICD-10-CM

## 2012-04-09 DIAGNOSIS — N186 End stage renal disease: Secondary | ICD-10-CM

## 2012-04-09 DIAGNOSIS — I10 Essential (primary) hypertension: Secondary | ICD-10-CM

## 2012-04-09 LAB — TYPE AND SCREEN: ABO/RH(D): O POS

## 2012-04-09 LAB — BASIC METABOLIC PANEL
Chloride: 96 mEq/L (ref 96–112)
GFR calc Af Amer: 16 mL/min — ABNORMAL LOW (ref >90)
Potassium: 3.2 mEq/L — ABNORMAL LOW (ref 3.5–5.1)

## 2012-04-09 LAB — GLUCOSE, CAPILLARY
Glucose-Capillary: 198 mg/dL — ABNORMAL HIGH (ref 70–99)
Glucose-Capillary: 167 mg/dL — ABNORMAL HIGH (ref 70–99)
Glucose-Capillary: 150 mg/dL — ABNORMAL HIGH (ref 70–99)

## 2012-04-09 LAB — CBC
HCT: 26.5 % — ABNORMAL LOW (ref 39.0–52.0)
Hemoglobin: 8.3 g/dL — ABNORMAL LOW (ref 13.0–17.0)
RBC: 2.89 MIL/uL — ABNORMAL LOW (ref 4.22–5.81)
WBC: 8.2 10*3/uL (ref 4.0–10.5)

## 2012-04-09 MED ORDER — LANTHANUM CARBONATE 500 MG PO CHEW
1500.0000 mg | CHEWABLE_TABLET | Freq: Three times a day (TID) | ORAL | Status: DC
  Administered 2012-04-09: 1500 mg via ORAL
  Filled 2012-04-09 (×6): qty 3

## 2012-04-09 MED ORDER — WHITE PETROLATUM GEL
Status: AC
  Filled 2012-04-09: qty 5

## 2012-04-09 MED ORDER — SACCHAROMYCES BOULARDII 250 MG PO CAPS
250.0000 mg | ORAL_CAPSULE | Freq: Two times a day (BID) | ORAL | Status: DC
  Administered 2012-04-09 – 2012-04-10 (×3): 250 mg via ORAL
  Filled 2012-04-09 (×4): qty 1

## 2012-04-09 MED ORDER — POTASSIUM CHLORIDE CRYS ER 20 MEQ PO TBCR
40.0000 meq | EXTENDED_RELEASE_TABLET | Freq: Once | ORAL | Status: AC
  Administered 2012-04-09: 40 meq via ORAL
  Filled 2012-04-09: qty 2

## 2012-04-09 NOTE — Progress Notes (Signed)
Subjective: 3 Days Post-Op Procedure(s) (LRB): AMPUTATION ABOVE KNEE (Right) Patient reports pain as mild.    Objective: Vital signs in last 24 hours: Temp:  [97.5 F (36.4 C)-98.1 F (36.7 C)] 98.1 F (36.7 C) (05/05 0600) Pulse Rate:  [60-84] 60  (05/05 0600) Resp:  [16-20] 16  (05/05 0600) BP: (88-136)/(42-71) 119/54 mmHg (05/05 0600) SpO2:  [97 %-100 %] 100 % (05/05 0600) FiO2 (%):  [2 %] 2 % (05/04 1715) Weight:  [71.6 kg (157 lb 13.6 oz)] 71.6 kg (157 lb 13.6 oz) (05/04 2214)  Intake/Output from previous day: 05/04 0701 - 05/05 0700 In: 410 [P.O.:360; I.V.:50] Out: 101 [Urine:101] Intake/Output this shift:     Basename 04/09/12 0500 04/08/12 0856 04/08/12 0500 04/07/12 1257  HGB 8.3* 7.2* 4.1* 7.5*    Basename 04/09/12 0500 04/08/12 0856  WBC 8.2 8.8  RBC 2.89* 2.43*  HCT 26.5* 23.4*  PLT 261 290    Basename 04/09/12 0500 04/08/12 0856  NA 133* 133*  K 3.2* 3.6  CL 96 97  CO2 23 20  BUN 33* 20  CREATININE 3.94* 2.65*  GLUCOSE 199* 189*  CALCIUM 8.2* 8.2*   No results found for this basename: LABPT:2,INR:2 in the last 72 hours  Incision: dressing C/D/I Compartment soft  Assessment/Plan: 3 Days Post-Op Procedure(s) (LRB): AMPUTATION ABOVE KNEE (Right) Cont current care SNF pending  [name] Dorian R. 04/09/2012, 8:27 AM

## 2012-04-09 NOTE — Progress Notes (Signed)
I have seen and examined this patient and agree with the assessment/plan as outlined above by Lyda Perone NP. Patient doing well after Rt AKA- planned for HD tomorrow. Will likely need to go to SNF [facility]) prior to going home with recent mobility barriers. Romona Murdy K.,MD 04/09/2012 10:20 AM

## 2012-04-09 NOTE — Progress Notes (Signed)
Subjective:  Loose stool this a.m. per nursing-nonbloody, denies abdominal pain. Also no chest pain or shortness of breath.  Objective: Vital signs in last 24 hours: Temp:  [97.5 F (36.4 C)-98.1 F (36.7 C)] 97.6 F (36.4 C) (05/05 0900) Pulse Rate:  [60-76] 72  (05/05 0900) Resp:  [16-18] 16  (05/05 0900) BP: (119-136)/(54-73) 130/73 mmHg (05/05 0900) SpO2:  [97 %-100 %] 97 % (05/05 0900) FiO2 (%):  [2 %] 2 % (05/04 1715) Weight:  [71.6 kg (157 lb 13.6 oz)] 71.6 kg (157 lb 13.6 oz) (05/04 2214) Weight change: -1.5 kg (-3 lb 4.9 oz) Last BM Date: 04/08/12  Intake/Output from previous day: 05/04 0701 - 05/05 0700 In: 410 [P.O.:360; I.V.:50] Out: 101 [Urine:101] Total I/O In: -  Out: 2 [Stool:2]   Physical Exam: General: Alert and oriented x3 not short of breath at rest.  HEENT:  Sclera anicteric, no conjunctival injection or discharge. NECK:  Supple, JVP not seen, no carotid bruits, no palpable lymphadenopathy, no palpable goiter. CHEST: clear to auscultation, no wheezes, no crackles. HEART:  normal S1-S2, regular, no murmurs. ABDOMEN:  Moderately obese, soft, non-tender, no palpable organomegaly, no palpable masses, normal bowel sounds. GENITALIA:  Not examined. LOWER EXTREMITIES:  Right AKA stump with dressing remains clean and dry, LLE with SCD CENTRAL NERVOUS SYSTEM:  No focal neurologic deficit on gross examination.  Lab Results:  Basename 04/09/12 0500 04/08/12 0856  WBC 8.2 8.8  HGB 8.3* 7.2*  HCT 26.5* 23.4*  PLT 261 290    Basename 04/09/12 0500 04/08/12 0856  NA 133* 133*  K 3.2* 3.6  CL 96 97  CO2 23 20  GLUCOSE 199* 189*  BUN 33* 20  CREATININE 3.94* 2.65*  CALCIUM 8.2* 8.2*   Recent Results (from the past 240 hour(s))  MRSA PCR SCREENING     Status: Abnormal   Collection Time   03/31/12  5:19 AM      Component Value Range Status Comment   MRSA by PCR POSITIVE (*) NEGATIVE  Final      Studies/Results: No results  found.  Medications: Scheduled Meds:    . aspirin EC  81 mg Oral Daily  . clopidogrel  75 mg Oral Q breakfast  . darbepoetin (ARANESP) injection - DIALYSIS  100 mcg Intravenous Q Fri-HD  . enoxaparin (LOVENOX) injection  30 mg Subcutaneous Q24H  . famotidine  20 mg Oral Daily  . feeding supplement (NEPRO CARB STEADY)  237 mL Oral TID WC  . finasteride  5 mg Oral Daily  . insulin aspart  0-15 Units Subcutaneous TID WC  . insulin aspart  0-5 Units Subcutaneous QHS  . insulin glargine  40 Units Subcutaneous QHS  . isosorbide mononitrate  120 mg Oral QHS  . lanthanum  1,500 mg Oral TID WC  . levothyroxine  75 mcg Oral Q0600  . multivitamin  1 tablet Oral Daily  . pantoprazole  40 mg Oral QHS  . potassium chloride  40 mEq Oral Once  . rOPINIRole  2 mg Oral QHS  . simvastatin  20 mg Oral q1800  . sodium chloride  3 mL Intravenous Q12H  . DISCONTD: imipenem-cilastatin  250 mg Intravenous Q12H  . DISCONTD: lanthanum  1,000 mg Oral TID WC  . DISCONTD: vancomycin  750 mg Intravenous Q M,W,F-HD   Continuous Infusions:   PRN Meds:.sodium chloride, acetaminophen, ALPRAZolam, hydrALAZINE, hydrocortisone cream, methocarbamol, ondansetron (ZOFRAN) IV, sodium chloride, traMADol  Assessment/Plan:  Principal Problem:  *Foot ulcer due to secondary DM:  Patient presented with an obviously infected lage and deep RLE wound, which he says has been present only for 2 weeks, without antecedent trauma. He also has a more chronic right heel wound. He is now on Vancmycin/Primaxin day#9. Dr Victorino Dike provided orthopedic consultation. Unfortunately, MRI was not feasible, due to presence of a pacer.  Dr Victorino Dike felt that evaluation of peripheral arterial circulation is indicated. Dr Hart Rochester provided vascular surgical consultation, and patient underwent arteriogram on 04/03/12. > per Vasc pt was offered an orbital atherectomy and angioplasty of right femoropopliteal stenosis. He declined and decided to proceed with R  AKA. -S/P AKA today 5/2, appreciate Vasc and ortho assistance. - per ortho needs anticogulation for 2wks post op, on  prophylactic lovenox started 5/3 -Antibiotics DC'd 5/4 per ortho recommendations - Active Problems:  1. Cellulitis: See above.  2. ESRD (end stage renal disease): Patient is on HD, M-W-F.  -dialysis per renal  3. DM2 (diabetes mellitus, type 2):-continue Lantus, SSI   4. hypotension:  resolved- likely related to fluid removal with dialysis on 5/3, we'll monitor and further treat accordingly-unit of packed red blood cells ordered earlier on, will monitor and further treat accordingly. -Will restart low dose of Coreg in a.m. if BP remaining stable/elevated - Coreg, Benicar and Lasix  Had had to be discontinued 4/30 secondary to hypotension as well but Bp susbsequently stabilized and coreg was restarted. 5.H/O MI - on plavix. 6. Anemia -hemoglobin improved status post transfusion of 1 unit of packed red blood cells 5/4, stable. 7. Hypokalemia -Replace K.  -If remains stable on plan discharge to skilled nursing in a.m, discussed with renal.  LOS: 10 days   Abby Tucholski C 04/09/2012, 11:57 AM

## 2012-04-09 NOTE — Progress Notes (Signed)
Subjective:  Sleepy otherwise feeling better; s/p transfusion; pain better controlled; still having BM's; but appetite poor   Vital signs in last 24 hours: Filed Vitals:   04/08/12 1326 04/08/12 1715 04/08/12 2214 04/09/12 0600  BP: 125/67 136/71 121/57 119/54  Pulse: 75 76 61 60  Temp: 97.5 F (36.4 C) 97.6 F (36.4 C) 97.5 F (36.4 C) 98.1 F (36.7 C)  TempSrc: Oral Oral Oral Oral  Resp: 18 18 18 16   Height:      Weight:   71.6 kg (157 lb 13.6 oz)   SpO2:  97% 99% 100%   Weight change: -1.5 kg (-3 lb 4.9 oz)  Intake/Output Summary (Last 24 hours) at 04/09/12 0830 Last data filed at 04/09/12 0700  Gross per 24 hour  Intake    410 ml  Output    101 ml  Net    309 ml   Labs: Basic Metabolic Panel:  Lab 04/09/12 8119 04/08/12 0856 04/07/12 1256 04/05/12 1207 04/03/12 0600  NA 133* 133* 135 -- --  K 3.2* 3.6 4.3 -- --  CL 96 97 98 -- --  CO2 23 20 20  -- --  GLUCOSE 199* 189* 219* -- --  BUN 33* 20 45* -- --  CREATININE 3.94* 2.65* 4.76* -- --  CALCIUM 8.2* 8.2* 8.5 -- --  ALB -- -- -- -- --  PHOS -- -- 8.2* 5.9* 5.5*   Liver Function Tests:  Lab 04/07/12 1256 04/05/12 1207 04/03/12 0600  AST -- -- --  ALT -- -- --  ALKPHOS -- -- --  BILITOT -- -- --  PROT -- -- --  ALBUMIN 2.1* 2.2* 2.1*   No results found for this basename: LIPASE:3,AMYLASE:3 in the last 168 hours No results found for this basename: AMMONIA:3 in the last 168 hours CBC:  Lab 04/09/12 0500 04/08/12 0856 04/08/12 0500 04/07/12 1257 04/05/12 1205  WBC 8.2 8.8 5.6 -- --  NEUTROABS -- -- -- -- --  HGB 8.3* 7.2* 4.1* -- --  HCT 26.5* 23.4* 13.9* -- --  MCV 91.7 96.3 96.5 96.1 92.8  PLT 261 290 182 -- --   Cardiac Enzymes: No results found for this basename: CKTOTAL:5,CKMB:5,CKMBINDEX:5,TROPONINI:5 in the last 168 hours CBG:  Lab 04/09/12 0800 04/08/12 2141 04/08/12 1701 04/08/12 1152 04/08/12 0749  GLUCAP 198* 170* 141* 190* 193*    Iron Studies: No results found for this basename:  IRON,TIBC,TRANSFERRIN,FERRITIN in the last 72 hours Studies/Results: No results found. Medications:      . aspirin EC  81 mg Oral Daily  . clopidogrel  75 mg Oral Q breakfast  . darbepoetin (ARANESP) injection - DIALYSIS  100 mcg Intravenous Q Fri-HD  . enoxaparin (LOVENOX) injection  30 mg Subcutaneous Q24H  . famotidine  20 mg Oral Daily  . feeding supplement (NEPRO CARB STEADY)  237 mL Oral TID WC  . finasteride  5 mg Oral Daily  . insulin aspart  0-15 Units Subcutaneous TID WC  . insulin aspart  0-5 Units Subcutaneous QHS  . insulin glargine  40 Units Subcutaneous QHS  . isosorbide mononitrate  120 mg Oral QHS  . lanthanum  1,000 mg Oral TID WC  . levothyroxine  75 mcg Oral Q0600  . multivitamin  1 tablet Oral Daily  . pantoprazole  40 mg Oral QHS  . rOPINIRole  2 mg Oral QHS  . simvastatin  20 mg Oral q1800  . sodium chloride  3 mL Intravenous Q12H  . DISCONTD: carvedilol  6.25 mg  Oral BID WC  . DISCONTD: imipenem-cilastatin  250 mg Intravenous Q12H  . DISCONTD: vancomycin  750 mg Intravenous Q M,W,F-HD    I  have reviewed scheduled and prn medications.  Physical Exam:  General: comfortable  Heart: RRR  Lungs: No adventitious BS; pacer left chest  Abdomen: soft, NT, ND, +BS  Extremities: bulky dressing right AKA stump, no LLE edema  Dialysis Access: RIJ PC   Problem/Plan:  1. S/P Right AKA- on Vancomycin and Primaxin IV, ortho following; cleared for discharge from their standpoint (f/u with them in 2 wks) 2. ESRD - MWF [place]; HD tomorrow using RIJ catheter; needs evaluation new access in outpt 3. Hypokalemia- replete with po KCL today then f/u with pre-tx labs on HD tomorrow 4. Hypertension/volume - hypotensive resolved s/p transfusion; cont meds and UF with HD as tolerable; follow closely 5. Anemia - Hgb ^ 8.3 s/p transfusion 1 unit PRBC's yesterday; continue ESA and follow closely 6. Metabolic bone disease -phos now 8.2; ^ Fosrenol; avoid calcium binders; cont  follow 7. Nutrition - Alb 2.1; no longer nauseated but appetite and intake remains poor; on ^ protein renal diet; encouraged Nepro suppl scheduled w/meals; follow  8. CAD - s/p CABG and stenting- on ASA, Plavix, Coreg and Imdur; anticoagulation 2 wk postop per ortho.  9. DM - CBG and SSI 10. Disposition- per primary services  Samuel Germany, FNP-C [facility] Pager [phone]  04/09/2012,8:30 AM  LOS: 10 days

## 2012-04-10 ENCOUNTER — Encounter (HOSPITAL_COMMUNITY): Payer: Self-pay | Admitting: Orthopedic Surgery

## 2012-04-10 ENCOUNTER — Inpatient Hospital Stay (HOSPITAL_COMMUNITY): Payer: PRIVATE HEALTH INSURANCE

## 2012-04-10 DIAGNOSIS — N186 End stage renal disease: Secondary | ICD-10-CM

## 2012-04-10 DIAGNOSIS — L97509 Non-pressure chronic ulcer of other part of unspecified foot with unspecified severity: Secondary | ICD-10-CM

## 2012-04-10 DIAGNOSIS — E119 Type 2 diabetes mellitus without complications: Secondary | ICD-10-CM

## 2012-04-10 DIAGNOSIS — I10 Essential (primary) hypertension: Secondary | ICD-10-CM

## 2012-04-10 LAB — CBC
MCH: 29.3 pg (ref 26.0–34.0)
MCHC: 31.9 g/dL (ref 30.0–36.0)
Platelets: 285 10*3/uL (ref 150–400)
RDW: 16.7 % — ABNORMAL HIGH (ref 11.5–15.5)

## 2012-04-10 LAB — BASIC METABOLIC PANEL
Calcium: 8.2 mg/dL — ABNORMAL LOW (ref 8.4–10.5)
GFR calc non Af Amer: 10 mL/min — ABNORMAL LOW (ref >90)
Sodium: 131 mEq/L — ABNORMAL LOW (ref 135–145)

## 2012-04-10 MED ORDER — SACCHAROMYCES BOULARDII 250 MG PO CAPS: 250.0000 mg | ORAL_CAPSULE | Freq: Two times a day (BID) | ORAL | Status: AC

## 2012-04-10 MED ORDER — ENOXAPARIN SODIUM 30 MG/0.3ML ~~LOC~~ SOLN: 30.0000 mg | SUBCUTANEOUS | Status: DC

## 2012-04-10 MED ORDER — CARVEDILOL 6.25 MG PO TABS
6.2500 mg | ORAL_TABLET | Freq: Two times a day (BID) | ORAL | Status: DC
  Administered 2012-04-10: 6.25 mg via ORAL
  Filled 2012-04-10 (×3): qty 1

## 2012-04-10 MED ORDER — NEPRO/CARBSTEADY PO LIQD: 237.0000 mL | Freq: Three times a day (TID) | ORAL | Status: DC

## 2012-04-10 MED ORDER — ALPRAZOLAM 0.25 MG PO TABS: 0.2500 mg | ORAL_TABLET | Freq: Two times a day (BID) | ORAL | Status: AC | PRN

## 2012-04-10 MED ORDER — TRAMADOL HCL 50 MG PO TABS: 50.0000 mg | ORAL_TABLET | Freq: Four times a day (QID) | ORAL | Status: AC | PRN

## 2012-04-10 MED ORDER — CARVEDILOL 6.25 MG PO TABS: 6.2500 mg | ORAL_TABLET | Freq: Two times a day (BID) | ORAL | Status: DC

## 2012-04-10 MED ORDER — METHOCARBAMOL 500 MG PO TABS: 500.0000 mg | ORAL_TABLET | Freq: Three times a day (TID) | ORAL | Status: AC | PRN

## 2012-04-10 NOTE — Progress Notes (Signed)
Pt was discharged back to SNF with his same indwelling catheter that he came in with on admission.

## 2012-04-10 NOTE — Progress Notes (Signed)
[facility] Progress Note  Subjective:  Just a little pain in right leg. Eating ok.  Plans to return to [facility]  Objective Filed Vitals:   04/10/12 0715 04/10/12 0720 04/10/12 0730 04/10/12 0732  BP: 155/74 154/79 118/63 130/67  Pulse: 79 77 79 84  Temp: 97 F (36.1 C)     TempSrc: Oral     Resp: 16     Height:      Weight: 73.5 kg (162 lb 0.6 oz)     SpO2:       Physical Exam on HD goal 2.5 General:NAD on HD Heart:RRR Lungs:no wheezes or rales anteriorly Abdomen:soft NT Extremities:right AKA wrapped Dialysis Access: right I-J active  Assessment/Plan: 1. S/P Right AKA- , ortho following; cleared for discharge from their standpoint (f/u with them in 2 wks)anticoagulation lovenox 2 wk postop per ortho.  2. ESRD - MWF [place]; HD tomorrow using RIJ catheter; needs evaluation new access in outpt; EDW 78 on addmission 3. Hypokalemia- replete with po KCL Sunday; 4 K bath today; may need 3 K bath at d/c 4. Hypertension/volume - BP coming down on HD with volume removed-  off norvasc and coreg  5. Anemia - Hgb ^ 8.1 s/p transfusion 1 unit PRBC's for Hgb 7.2 continue Aranesp 100 (Epo 6600 on adm) and follow closely 6. Metabolic bone disease -phos now 8.2; ^ Fosrenol; avoid calcium binders; adj Ca 9.8; labs pending for today; evaluate for resuming Zemplar at d/c 7. Nutrition - Alb 2.1; no longer nauseated but appetite and intake remains poor; on ^ protein renal diet; encouraged Nepro suppl scheduled w/meals; follow  8. CAD - s/p CABG and stenting- on ASA, Plavix, and Imdur; off coreg - might be prudent to resume low dose 6.25 BID coreg;we can titrate it up further in the outpt setting if necessary 9.  DM - CBG and SSI 10. Disposition- per primary services  Additional Objective Labs: Basic Metabolic Panel:  Lab 04/10/12 9147 04/09/12 0500 04/08/12 0856 04/07/12 1256 04/05/12 1207  NA 131* 133* 133* -- --  K 3.4* 3.2* 3.6 -- --  CL 96 96 97 -- --  CO2 21 23 20   -- --  GLUCOSE 127* 199* 189* -- --  BUN 44* 33* 20 -- --  CREATININE 5.00* 3.94* 2.65* -- --  CALCIUM 8.2* 8.2* 8.2* -- --  ALB -- -- -- -- --  PHOS -- -- -- 8.2* 5.9*   Liver Function Tests:  Lab 04/07/12 1256 04/05/12 1207  AST -- --  ALT -- --  ALKPHOS -- --  BILITOT -- --  PROT -- --  ALBUMIN 2.1* 2.2*  CBC:  Lab 04/10/12 0520 04/09/12 0500 04/08/12 0856 04/08/12 0500 04/07/12 1257  WBC 7.4 8.2 8.8 -- --  NEUTROABS -- -- -- -- --  HGB 8.1* 8.3* 7.2* -- --  HCT 25.4* 26.5* 23.4* -- --  MCV 92.0 91.7 96.3 96.5 96.1  PLT 285 261 290 -- --  CBG:  Lab 04/09/12 2213 04/09/12 1715 04/09/12 1212 04/09/12 0800 04/08/12 2141  GLUCAP 150* 167* 215* 198* 170*  Medications:      . aspirin EC  81 mg Oral Daily  . clopidogrel  75 mg Oral Q breakfast  . darbepoetin (ARANESP) injection - DIALYSIS  100 mcg Intravenous Q Fri-HD  . enoxaparin (LOVENOX) injection  30 mg Subcutaneous Q24H  . famotidine  20 mg Oral Daily  . feeding supplement (NEPRO CARB STEADY)  237 mL Oral TID WC  . finasteride  5 mg Oral Daily  . insulin aspart  0-15 Units Subcutaneous TID WC  . insulin aspart  0-5 Units Subcutaneous QHS  . insulin glargine  40 Units Subcutaneous QHS  . isosorbide mononitrate  120 mg Oral QHS  . lanthanum  1,500 mg Oral TID WC  . levothyroxine  75 mcg Oral Q0600  . multivitamin  1 tablet Oral Daily  . pantoprazole  40 mg Oral QHS  . potassium chloride  40 mEq Oral Once  . rOPINIRole  2 mg Oral QHS  . saccharomyces boulardii  250 mg Oral BID  . simvastatin  20 mg Oral q1800  . sodium chloride  3 mL Intravenous Q12H  . white petrolatum      . DISCONTD: lanthanum  1,000 mg Oral TID WC    I  have reviewed scheduled and prn medications.  Sheffield Slider, PA-C [facility] Beeper [phone]  04/10/2012,8:24 AM  LOS: 11 days  I have seen and examined this patient and agree with plan as outlined above, and have personally attended his dialysis  session.Camille Bal B,MD 04/10/2012 2:04 PM

## 2012-04-10 NOTE — Discharge Summary (Signed)
Discharge Note  Name: Mitsuru Dault MRN: [id number] DOB: 11/30/1934 78 y.o.  Date of Admission: 03/30/2012  7:12 PM Date of Discharge: 04/10/2012 Attending Physician: Kela Millin, MD  Discharge Diagnosis: Principal Problem:  *Right leg and heel nonhealing diabetic ulcers  and severe peripheral vascular disease. Active Problems:  Cellulitis  ESRD (end stage renal disease)  DM2 (diabetes mellitus, type 2)  HTN (hypertension)   Discharge Medications: Medication List  As of 04/10/2012 10:57 AM   STOP taking these medications         amLODipine 5 MG tablet      aspirin 81 MG tablet      Cholecalciferol 50000 UNITS Tabs      dutasteride 0.5 MG capsule      ferrous sulfate 325 (65 FE) MG tablet      fluticasone 110 MCG/ACT inhaler      furosemide 80 MG tablet      HUMULIN R 100 units/mL injection      omeprazole 20 MG capsule      valsartan 320 MG tablet      zolpidem 5 MG tablet         TAKE these medications         ALPRAZolam 0.25 MG tablet   Commonly known as: XANAX   Take 1 tablet (0.25 mg total) by mouth 2 (two) times daily as needed for anxiety.      aspirin EC 81 MG tablet   Take 81 mg by mouth daily.      b complex-vitamin c-folic acid 0.8 MG Tabs   Take 0.8 mg by mouth at bedtime.      benzonatate 100 MG capsule   Commonly known as: TESSALON   Take 100 mg by mouth 3 (three) times daily as needed.      calcium acetate 667 MG capsule   Commonly known as: PHOSLO   Take 1,334 mg by mouth 3 (three) times daily with meals.      calcium carbonate (dosed in mg elemental calcium) 1250 MG/5ML   Take 500 mg of elemental calcium by mouth every 6 (six) hours as needed. For antacid      carvedilol 6.25 MG tablet   Commonly known as: COREG   Take 1 tablet (6.25 mg total) by mouth 2 (two) times daily with a meal.      clopidogrel 75 MG tablet   Commonly known as: PLAVIX   Take 75 mg by mouth daily.      darbepoetin 100 MCG/0.5ML Soln   Commonly known  as: ARANESP   Inject 100 mcg into the skin every 7 (seven) days. friday      enoxaparin 30 MG/0.3ML injection   Commonly known as: LOVENOX   Inject 0.3 mLs (30 mg total) into the skin daily.      feeding supplement (NEPRO CARB STEADY) Liqd   Take 237 mLs by mouth See admin instructions. Take 1 can at bedtime and 1 can three times a day as needed      feeding supplement (NEPRO CARB STEADY) Liqd   Take 237 mLs by mouth 3 (three) times daily with meals.      finasteride 5 MG tablet   Commonly known as: PROSCAR   Take 5 mg by mouth daily.      Fluticasone-Salmeterol 500-50 MCG/DOSE Aepb   Commonly known as: ADVAIR   Inhale 1 puff into the lungs every 12 (twelve) hours.      gabapentin 300 MG capsule   Commonly known  as: NEURONTIN   Take 300 mg by mouth 2 (two) times daily.      HYDROcodone-acetaminophen 5-500 MG per tablet   Commonly known as: VICODIN   Take 1 tablet by mouth every 6 (six) hours as needed.      insulin glargine 100 UNIT/ML injection   Commonly known as: LANTUS   Inject 35 Units into the skin at bedtime.      insulin regular 100 units/mL injection   Commonly known as: NOVOLIN R,HUMULIN R   Inject 2-8 Units into the skin 3 (three) times daily before meals. SSI:  CBG 200-250= 2 units; 251-300= 4 units; 301-350= 6 units; 351-400= 8 units; > 400= call MD      ipratropium-albuterol 0.5-2.5 (3) MG/3ML Soln   Commonly known as: DUONEB   Take 3 mLs by nebulization 4 (four) times daily.      isosorbide mononitrate 120 MG 24 hr tablet   Commonly known as: IMDUR   Take 120 mg by mouth at bedtime.      ketoconazole 2 % shampoo   Commonly known as: NIZORAL   Apply 1 application topically 3 (three) times a week. Tuesday, Thursday, and Saturday.  Rotation nizoral, neutrogena t gel and head and shoulders shampoo      lanthanum 1000 MG chewable tablet   Commonly known as: FOSRENOL   Chew 1,000 mg by mouth 3 (three) times daily with meals.      levothyroxine 75 MCG  tablet   Commonly known as: SYNTHROID, LEVOTHROID   Take 75 mcg by mouth at bedtime.      methocarbamol 500 MG tablet   Commonly known as: ROBAXIN   Take 1 tablet (500 mg total) by mouth every 8 (eight) hours as needed.      ondansetron 4 MG tablet   Commonly known as: ZOFRAN   Take 4 mg by mouth every 4 (four) hours as needed. For nausea      pantoprazole 40 MG tablet   Commonly known as: PROTONIX   Take 40 mg by mouth at bedtime.      pravastatin 40 MG tablet   Commonly known as: PRAVACHOL   Take 40 mg by mouth at bedtime.      ranitidine 150 MG capsule   Commonly known as: ZANTAC   Take 150 mg by mouth daily.      rOPINIRole 2 MG tablet   Commonly known as: REQUIP   Take 2 mg by mouth at bedtime.      saccharomyces boulardii 250 MG capsule   Commonly known as: FLORASTOR   Take 1 capsule (250 mg total) by mouth 2 (two) times daily.      traMADol 50 MG tablet   Commonly known as: ULTRAM   Take 1 tablet (50 mg total) by mouth every 6 (six) hours as needed.      Vitamin D (Ergocalciferol) 50000 UNITS Caps   Commonly known as: DRISDOL   Take 50,000 Units by mouth every 30 (thirty) days. On the 1st of month      zinc sulfate 220 MG capsule   Take 220 mg by mouth daily. Taking for a total of 30 days.  Last day of therapy: 04/06/12            Disposition and follow-up:   Mr.Jaamal Mcginness was discharged from [facility] in improved/stable condition.    Follow-up Appointments: Discharge Orders    Future Orders Please Complete By Expires   Diet Carb Modified  Increase activity slowly         Consultations: Treatment Team:  Toni Arthurs, MD Vascular[facility]Dr [facility]   Procedures Performed:   Arterial Doppler US +----------------+--------+-------------+------------------+ Unable to ascertain ABIs secondary to calcified vessels. Great toes pressures are  abnormal.  --------------------------------------------------------  Aortogram, B leg runoff, BLE PAD, R leg dry gangrene-per Dr. Genevieve Norlander  PROCEDURE: Right above-knee amputation-04/06/12 POSTOPERATIVE DIAGNOSES: Right leg and heel nonhealing diabetic ulcers  and severe peripheral vascular disease.     Admission HPI Patient is a 78 year old male with history of end-stage renal disease transferred from [facility] due to chronic nonhealing right leg ulcer. Per patient, his ulcer has been present for about a month. It suddenly appeared on the side of his leg, was seen by his doctor and started on treatment which included cleaning daily. He also got some medication that he does not remember what they were. The also continued to get bigger and more painful. He also noted that the area around the ulcer is now red and painful. He went to the emergency room) [facility] from where he was transferred here secondary to being a dialysis patient. He denied active fever or chills although he's had some fever in the past. Patient is a dialysis patient also a diabetic.  Physical Exam:  General: Alert and oriented x3 not short of breath at rest.  HEENT: Sclera anicteric, no conjunctival injection or discharge.  NECK: Supple, JVP not seen, no carotid bruits, no palpable lymphadenopathy, no palpable goiter.  CHEST: clear to auscultation, no wheezes, no crackles.  HEART: normal S1-S2, regular, no murmurs.  ABDOMEN: Moderately obese, soft, non-tender, no palpable organomegaly, no palpable masses, normal bowel sounds.  LOWER EXTREMITIES: Right AKA stump with dressing remains clean and dry, LLE with SCD  CENTRAL NERVOUS SYSTEM: No focal neurologic deficit on gross examination.  Hospital Course by problem list: Principal Problem:  *Right leg and heel nonhealing diabetic ulcers  and severe peripheral vascular disease.  Active Problems:  Cellulitis  ESRD (end stage renal disease)  DM2 (diabetes  mellitus, type 2)  HTN (hypertension)  Principal Problem:  *Right leg and heel nonhealing diabetic ulcers and severe peripheral vascular disease.  -Patient presented with an obviously infected large and deep RLE wound, which he reported had been present only for 2 weeks, without antecedent trauma. He also has a more chronic right heel wound. He is was placed on empiric antibiotics with  Vancmycin/Primaxin. Dr Victorino Dike provided orthopedic consultation. Unfortunately, MRI was not feasible, due to presence of a pacer. Dr Victorino Dike felt that evaluation of peripheral arterial circulation is indicated. Dr Hart Rochester provided vascular surgical consultation, and patient underwent arteriogram on 04/03/12. > per Vasc pt was offered an orbital atherectomy and angioplasty of right femoropopliteal stenosis. He declined and decided to proceed with R AKA.  And he was taken to the OR and 5/2 per Dr. Victorino Dike and  an AKA today 5/2 was done. He followed up with outpatient on 5/3 and a recommended restarting anticoagulation to be continued for 2 weeks. Prophylactic Lovenox was started accordingly and 5/3 and he is to continue this rule 5/16. His aspirin and Plavix were also resumed. The patient had been on antibiotics as above started on admission and the following his surgery, I discussed further antibiotics with the orthopedic service and they stated that he did not need any further antibiotics and so the second and maximum the discontinued on 5/4. He has she remained afebrile with no leukocytosis. History followup with Dr.  Hewitt in 2 weeks as directed. He be discharged to a skilled nursing facility at this time for further rehabilitation - Active Problems:  1. Cellulitis: See above.  2. ESRD (end stage renal disease): Patient is on HD, M-W-F.  -Renal followed and I lysed patient to the hospital. 3. DM2 (diabetes mellitus, type 2):- He was maintained on Lantus and sliding scale insulin coverage while in the hospital with dose was  adjustments as appropriate. 4. hypotension: resolved.the patient had some episodes of hypotension in the hospital initially on a 4/30-and following that the coreg recommended and Lasix weight discontinued and the hypotension resolved. Subsequently his blood pressures were staying elevated and so Coreg alone was restarted.subsequently on 5/3 following dialysis he developed hypotension and he was bolused with IV fluids and also transfused each unit of packed red blood cells. His Coreg was held as well. Following this the hypotension resolved. The impression was that it was likely related to fluid removal with dialysis on 5/3. He was monitored and his blood pressures have remained stable -> elevated and so his coreg was restarted at  low dose of Coreg -6.25 twice a day.nursing home M.D. to continue to monitor and further manage as appropriate.  5.H/O MI  Remained chest pain-free in the hospital. His to continue his aspirin, Plavix, and Coreg as discussed above.  6. Anemia - his repeat hemoglobin on 5/4 was 7.2, he was transfused 1 unit of packed red blood cells 5/4 as he was also hypotensive as discussed above. He did not have any gross evidence of bleeding.he has remained hemodynamically. His hemoglobin today prior to discharge is 8.1.  7. Hypokalemia  His potassium was replaced in the hospital   Discharge Vitals:  BP 151/59  Pulse 84  Temp(Src) 97.8 F (36.6 C) (Oral)  Resp 15  Ht 5\' 7"  (1.702 m)  Wt 71.8 kg (158 lb 4.6 oz)  BMI 24.79 kg/m2  SpO2 97%  Discharge Labs:  Results for orders placed during the hospital encounter of 03/30/12 (from the past 24 hour(s))  GLUCOSE, CAPILLARY     Status: Abnormal   Collection Time   04/09/12 12:12 PM      Component Value Range   Glucose-Capillary 215 (*) 70 - 99 (mg/dL)  GLUCOSE, CAPILLARY     Status: Abnormal   Collection Time   04/09/12  5:15 PM      Component Value Range   Glucose-Capillary 167 (*) 70 - 99 (mg/dL)  GLUCOSE, CAPILLARY     Status:  Abnormal   Collection Time   04/09/12 10:13 PM      Component Value Range   Glucose-Capillary 150 (*) 70 - 99 (mg/dL)  CBC     Status: Abnormal   Collection Time   04/10/12  5:20 AM      Component Value Range   WBC 7.4  4.0 - 10.5 (K/uL)   RBC 2.76 (*) 4.22 - 5.81 (MIL/uL)   Hemoglobin 8.1 (*) 13.0 - 17.0 (g/dL)   HCT 16.1 (*) 09.6 - 52.0 (%)   MCV 92.0  78.0 - 100.0 (fL)   MCH 29.3  26.0 - 34.0 (pg)   MCHC 31.9  30.0 - 36.0 (g/dL)   RDW 04.5 (*) 40.9 - 15.5 (%)   Platelets 285  150 - 400 (K/uL)  BASIC METABOLIC PANEL     Status: Abnormal   Collection Time   04/10/12  5:20 AM      Component Value Range   Sodium 131 (*) 135 - 145 (  mEq/L)   Potassium 3.4 (*) 3.5 - 5.1 (mEq/L)   Chloride 96  96 - 112 (mEq/L)   CO2 21  19 - 32 (mEq/L)   Glucose, Bld 127 (*) 70 - 99 (mg/dL)   BUN 44 (*) 6 - 23 (mg/dL)   Creatinine, Ser 1.61 (*) 0.50 - 1.35 (mg/dL)   Calcium 8.2 (*) 8.4 - 10.5 (mg/dL)   GFR calc non Af Amer 10 (*) >90 (mL/min)   GFR calc Af Amer 12 (*) >90 (mL/min)    SignedDonnalee Curry C 04/10/2012, 10:57 AM

## 2012-04-10 NOTE — Procedures (Signed)
I have personally attended this patient's dialysis session.  Access is PC.  Goal 2500.  BFR 400.  Some post-op pain.  No issues with his treatment thus far.  No heparin with today's session.  [name] [name] B

## 2012-04-10 NOTE — Progress Notes (Signed)
Clinical Social Worker facilitated pt discharge needs including contacting facility and arranging ambulance transportation to [facility]. Pt stated that he had notified pt daughter of discharge and pt daughter planned to meet pt at facility. Facility to notify pt wife who is also a resident at facility. No further social work needs identified at this time. Clinical Social Worker signing off.   Jacklynn Lewis, MSW, LCSWA  Clinical Social Work [phone]

## 2012-04-12 ENCOUNTER — Other Ambulatory Visit (HOSPITAL_COMMUNITY): Payer: Self-pay | Admitting: *Deleted

## 2012-04-25 ENCOUNTER — Ambulatory Visit: Payer: PRIVATE HEALTH INSURANCE | Admitting: Vascular Surgery

## 2012-05-09 ENCOUNTER — Ambulatory Visit: Payer: PRIVATE HEALTH INSURANCE | Admitting: Vascular Surgery

## 2012-05-23 ENCOUNTER — Encounter: Payer: Self-pay | Admitting: Vascular Surgery

## 2012-05-24 ENCOUNTER — Ambulatory Visit (INDEPENDENT_AMBULATORY_CARE_PROVIDER_SITE_OTHER): Payer: PRIVATE HEALTH INSURANCE | Admitting: Vascular Surgery

## 2012-05-24 ENCOUNTER — Encounter: Payer: Self-pay | Admitting: Vascular Surgery

## 2012-05-24 VITALS — BP 212/103 | HR 82 | Resp 18 | Ht 67.0 in | Wt 158.0 lb

## 2012-05-24 DIAGNOSIS — Z01810 Encounter for preprocedural cardiovascular examination: Secondary | ICD-10-CM

## 2012-05-24 DIAGNOSIS — N186 End stage renal disease: Secondary | ICD-10-CM

## 2012-05-24 NOTE — Progress Notes (Signed)
Bilateral UE vein mapping performed [facility] 05/24/2012

## 2012-05-24 NOTE — Progress Notes (Signed)
[facility] [place]  Patient name: [name] [name] MRN: [id number] DOB: 01/20/1934 Sex: male  REASON FOR VISIT: evaluate for new access.  HPI: [name] [name] is a 77 y.o. male status post right above-the-knee amputation. He has had attempted fistulas in both arms and currently dialyzes with a dietetic catheter on the right side. He was sent to be evaluated for further access. He's had no recent uremic symptoms. Typically he denies nausea, vomiting, fatigue, anorexia, or palpitations.   REVIEW OF SYSTEMS: [X ] denotes positive finding; [  ] denotes negative finding  CARDIOVASCULAR:  [ ] chest pain   [ ] dyspnea on exertion    CONSTITUTIONAL:  [ ] fever   [ ] chills  PHYSICAL EXAM: Filed Vitals:   05/24/12 1550  BP: 212/103  Pulse: 82  Resp: 18  Height: 5' 7" (1.702 m)  Weight: 158 lb (71.668 kg)  SpO2: 95%   Body mass index is 24.75 kg/(m^2). GENERAL: The patient is a well-nourished male, in no acute distress. The vital signs are documented above. CARDIOVASCULAR: There is a regular rate and rhythm  PULMONARY: There is good air exchange bilaterally without wheezing or rales. He has a pacemaker in the left infraclavicular region. He has no significant arm swelling. He has palpable radial pulses although somewhat diminished.  I have reviewed his vein mapping today which shows that he does not have a forearm or upper arm cephalic vein or basilic vein on the right which would be usable for fistula. He does have a reasonable sized basilic vein in the left arm however he has a pacemaker on the left side and I would not recommend access in the left arm.  MEDICAL ISSUES: I've recommended we place a right arm graft. I've explained that I would not place access in the left arm because of his pacemaker. I've also explained that he does not have vein in the right arm such the fistula could be placed. The only other option would be a thigh graft which he is opposed to but I think  he has a reasonable chance of having success with the right arm graft. Currently is not willing to schedule surgery hopefully he'll call to the near future to schedule this on a Tuesday or Thursday which is a nondialysis day.  [name] [name] S [facility] [place] Beeper: [phone]     

## 2012-05-29 ENCOUNTER — Other Ambulatory Visit: Payer: Self-pay

## 2012-05-30 ENCOUNTER — Encounter (HOSPITAL_COMMUNITY): Payer: Self-pay | Admitting: Pharmacy Technician

## 2012-05-30 NOTE — Progress Notes (Addendum)
Spoke with patient's nurse at facility about time to arrive and what to take a.m. Of surgery: Coreg, Neurontin, Levothyroxine, Protonix.   Asked her to follow up with MD about Plavix about stopping it or not. Olegario Messier is to fax 1 V CXR patient had on 05/23/12.   Faxed AICD form to [facility] in [place], received confirmation fax received.   According to nurse at facility patient has pacemaker but according to [facility] at Cardiology office, note on patient mentions ICD.   Requested from [facility]:most recent office note, any tests, EKG. Received and placed on chart-per Koleen Nimrod at this office no tests to fax to [place].   Chart placed in Sec A follow up cabinet for follow up on requested info with note to put in SDL cabinet when received.    Kerry [employer] rep notified of surgery and time of surgery.

## 2012-05-31 MED ORDER — SODIUM CHLORIDE 0.9 % IV SOLN: INTRAVENOUS | Status: DC

## 2012-05-31 MED ORDER — VANCOMYCIN HCL IN DEXTROSE 1-5 GM/200ML-% IV SOLN
1000.0000 mg | INTRAVENOUS | Status: AC
  Administered 2012-06-01: 1000 mg via INTRAVENOUS
  Filled 2012-05-31: qty 200

## 2012-05-31 NOTE — Procedures (Unsigned)
CEPHALIC VEIN MAPPING  INDICATION:  End-stage renal disease.  HISTORY:  EXAM:  The right cephalic vein is not compressible.  The right basilic vein is not visualized.  Diameter measurements range from  The left cephalic vein is not compressible.  The left basilic vein is compressible.  Diameter measurements range from 0.66 to 0.40 cm.  See attached worksheet for all measurements.  IMPRESSION:  Bilateral cephalic veins are non compressible.  The right basilic vein is not visualized.  The left basilic vein is compressible with diameter measurements as described on worksheet.  ___________________________________________ Di Kindle. Edilia Bo, M.D.  SH/MEDQ  D:  05/24/2012  T:  05/24/2012  Job:  [id number]

## 2012-06-01 ENCOUNTER — Encounter (HOSPITAL_COMMUNITY)
Admission: RE | Disposition: A | Discharge status: Home / Self Care | Payer: Self-pay | Source: Ambulatory Visit | Attending: Vascular Surgery

## 2012-06-01 ENCOUNTER — Ambulatory Visit (HOSPITAL_COMMUNITY): Payer: PRIVATE HEALTH INSURANCE

## 2012-06-01 ENCOUNTER — Ambulatory Visit (HOSPITAL_COMMUNITY): Payer: PRIVATE HEALTH INSURANCE | Admitting: Anesthesiology

## 2012-06-01 ENCOUNTER — Encounter (HOSPITAL_COMMUNITY): Payer: Self-pay | Admitting: Anesthesiology

## 2012-06-01 ENCOUNTER — Encounter (HOSPITAL_COMMUNITY): Payer: Self-pay | Admitting: *Deleted

## 2012-06-01 ENCOUNTER — Ambulatory Visit (HOSPITAL_COMMUNITY)
Admission: RE | Admit: 2012-06-01 | Discharge: 2012-06-01 | Disposition: A | Discharge status: Home / Self Care | Payer: PRIVATE HEALTH INSURANCE | Source: Ambulatory Visit | Attending: Vascular Surgery | Admitting: Vascular Surgery

## 2012-06-01 DIAGNOSIS — I120 Hypertensive chronic kidney disease with stage 5 chronic kidney disease or end stage renal disease: Secondary | ICD-10-CM | POA: Insufficient documentation

## 2012-06-01 DIAGNOSIS — I509 Heart failure, unspecified: Secondary | ICD-10-CM | POA: Insufficient documentation

## 2012-06-01 DIAGNOSIS — J4489 Other specified chronic obstructive pulmonary disease: Secondary | ICD-10-CM | POA: Insufficient documentation

## 2012-06-01 DIAGNOSIS — I2510 Atherosclerotic heart disease of native coronary artery without angina pectoris: Secondary | ICD-10-CM | POA: Insufficient documentation

## 2012-06-01 DIAGNOSIS — Z951 Presence of aortocoronary bypass graft: Secondary | ICD-10-CM | POA: Insufficient documentation

## 2012-06-01 DIAGNOSIS — F411 Generalized anxiety disorder: Secondary | ICD-10-CM | POA: Insufficient documentation

## 2012-06-01 DIAGNOSIS — Z95810 Presence of automatic (implantable) cardiac defibrillator: Secondary | ICD-10-CM | POA: Insufficient documentation

## 2012-06-01 DIAGNOSIS — N186 End stage renal disease: Secondary | ICD-10-CM

## 2012-06-01 DIAGNOSIS — E119 Type 2 diabetes mellitus without complications: Secondary | ICD-10-CM | POA: Insufficient documentation

## 2012-06-01 DIAGNOSIS — I209 Angina pectoris, unspecified: Secondary | ICD-10-CM | POA: Insufficient documentation

## 2012-06-01 DIAGNOSIS — Z992 Dependence on renal dialysis: Secondary | ICD-10-CM | POA: Insufficient documentation

## 2012-06-01 DIAGNOSIS — I252 Old myocardial infarction: Secondary | ICD-10-CM | POA: Insufficient documentation

## 2012-06-01 DIAGNOSIS — J449 Chronic obstructive pulmonary disease, unspecified: Secondary | ICD-10-CM | POA: Insufficient documentation

## 2012-06-01 DIAGNOSIS — N185 Chronic kidney disease, stage 5: Secondary | ICD-10-CM | POA: Insufficient documentation

## 2012-06-01 HISTORY — PX: AV FISTULA PLACEMENT: SHX1204

## 2012-06-01 LAB — SURGICAL PCR SCREEN
MRSA, PCR: NEGATIVE
Staphylococcus aureus: NEGATIVE

## 2012-06-01 LAB — GLUCOSE, CAPILLARY: Glucose-Capillary: 88 mg/dL (ref 70–99)

## 2012-06-01 SURGERY — INSERTION OF ARTERIOVENOUS (AV) GORE-TEX GRAFT ARM
Anesthesia: Monitor Anesthesia Care | Site: Arm Upper | Laterality: Right | Wound class: Clean

## 2012-06-01 MED ORDER — HYDROCODONE-ACETAMINOPHEN 5-500 MG PO TABS: 1.0000 | ORAL_TABLET | Freq: Four times a day (QID) | ORAL | Status: DC | PRN

## 2012-06-01 MED ORDER — HEPARIN SODIUM (PORCINE) 5000 UNIT/ML IJ SOLN
INTRAMUSCULAR | Status: DC | PRN
  Administered 2012-06-01: 08:00:00

## 2012-06-01 MED ORDER — 0.9 % SODIUM CHLORIDE (POUR BTL) OPTIME
TOPICAL | Status: DC | PRN
  Administered 2012-06-01: 1000 mL

## 2012-06-01 MED ORDER — SODIUM CHLORIDE 0.9 % IV SOLN
INTRAVENOUS | Status: DC | PRN
  Administered 2012-06-01: 08:00:00 via INTRAVENOUS

## 2012-06-01 MED ORDER — MUPIROCIN 2 % EX OINT
TOPICAL_OINTMENT | CUTANEOUS | Status: AC
  Administered 2012-06-01: 1 via NASAL
  Filled 2012-06-01: qty 22

## 2012-06-01 MED ORDER — FENTANYL CITRATE 0.05 MG/ML IJ SOLN: 25.0000 ug | INTRAMUSCULAR | Status: DC | PRN

## 2012-06-01 MED ORDER — LIDOCAINE HCL (PF) 1 % IJ SOLN
INTRAMUSCULAR | Status: AC
  Filled 2012-06-01: qty 30

## 2012-06-01 MED ORDER — LIDOCAINE-EPINEPHRINE (PF) 1 %-1:200000 IJ SOLN
INTRAMUSCULAR | Status: DC | PRN
  Administered 2012-06-01: 5 mL

## 2012-06-01 MED ORDER — ONDANSETRON HCL 4 MG/2ML IJ SOLN
INTRAMUSCULAR | Status: AC
  Filled 2012-06-01: qty 2

## 2012-06-01 MED ORDER — FENTANYL CITRATE 0.05 MG/ML IJ SOLN
INTRAMUSCULAR | Status: DC | PRN
  Administered 2012-06-01 (×3): 50 ug via INTRAVENOUS
  Administered 2012-06-01: 100 ug via INTRAVENOUS

## 2012-06-01 MED ORDER — MUPIROCIN 2 % EX OINT
TOPICAL_OINTMENT | Freq: Two times a day (BID) | CUTANEOUS | Status: DC
  Administered 2012-06-01: 1 via NASAL
  Filled 2012-06-01: qty 22

## 2012-06-01 MED ORDER — HEPARIN SODIUM (PORCINE) 1000 UNIT/ML IJ SOLN
INTRAMUSCULAR | Status: DC | PRN
  Administered 2012-06-01: 5000 [IU] via INTRAVENOUS

## 2012-06-01 MED ORDER — PROTAMINE SULFATE 10 MG/ML IV SOLN
INTRAVENOUS | Status: DC | PRN
  Administered 2012-06-01: 30 mg via INTRAVENOUS

## 2012-06-01 MED ORDER — ONDANSETRON HCL 4 MG/2ML IJ SOLN
4.0000 mg | Freq: Four times a day (QID) | INTRAMUSCULAR | Status: AC | PRN
  Administered 2012-06-01: 4 mg via INTRAVENOUS

## 2012-06-01 MED ORDER — LIDOCAINE HCL (PF) 1 % IJ SOLN
INTRAMUSCULAR | Status: DC | PRN
  Administered 2012-06-01: 15 mL

## 2012-06-01 MED ORDER — THROMBIN 20000 UNITS EX KIT
PACK | CUTANEOUS | Status: DC | PRN
  Administered 2012-06-01: 10:00:00 via TOPICAL

## 2012-06-01 MED ORDER — ONDANSETRON HCL 4 MG/2ML IJ SOLN
INTRAMUSCULAR | Status: DC | PRN
  Administered 2012-06-01: 4 mg via INTRAVENOUS

## 2012-06-01 MED ORDER — MIDAZOLAM HCL 5 MG/5ML IJ SOLN
INTRAMUSCULAR | Status: DC | PRN
  Administered 2012-06-01: 1 mg via INTRAVENOUS

## 2012-06-01 MED ORDER — LIDOCAINE-EPINEPHRINE (PF) 1 %-1:200000 IJ SOLN
INTRAMUSCULAR | Status: AC
  Filled 2012-06-01: qty 10

## 2012-06-01 SURGICAL SUPPLY — 42 items
CANISTER SUCTION 2500CC (MISCELLANEOUS) ×2 IMPLANT
CLIP TI MEDIUM 6 (CLIP) ×2 IMPLANT
CLIP TI WIDE RED SMALL 6 (CLIP) ×2 IMPLANT
CLOTH BEACON ORANGE TIMEOUT ST (SAFETY) ×2 IMPLANT
COVER SURGICAL LIGHT HANDLE (MISCELLANEOUS) ×4 IMPLANT
DECANTER SPIKE VIAL GLASS SM (MISCELLANEOUS) ×2 IMPLANT
DERMABOND ADVANCED (GAUZE/BANDAGES/DRESSINGS) ×1
DERMABOND ADVANCED .7 DNX12 (GAUZE/BANDAGES/DRESSINGS) ×1 IMPLANT
ELECT REM PT RETURN 9FT ADLT (ELECTROSURGICAL) ×2
ELECTRODE REM PT RTRN 9FT ADLT (ELECTROSURGICAL) ×1 IMPLANT
GEL ULTRASOUND 20GR AQUASONIC (MISCELLANEOUS) ×2 IMPLANT
GLOVE BIO SURGEON STRL SZ 6.5 (GLOVE) ×4 IMPLANT
GLOVE BIO SURGEON STRL SZ7.5 (GLOVE) ×2 IMPLANT
GLOVE BIOGEL PI IND STRL 6.5 (GLOVE) ×3 IMPLANT
GLOVE BIOGEL PI IND STRL 7.0 (GLOVE) ×2 IMPLANT
GLOVE BIOGEL PI IND STRL 7.5 (GLOVE) ×1 IMPLANT
GLOVE BIOGEL PI INDICATOR 6.5 (GLOVE) ×3
GLOVE BIOGEL PI INDICATOR 7.0 (GLOVE) ×2
GLOVE BIOGEL PI INDICATOR 7.5 (GLOVE) ×1
GLOVE SS N UNI LF 6.5 STRL (GLOVE) ×2 IMPLANT
GLOVE SS N UNI LF 7.0 STRL (GLOVE) ×4 IMPLANT
GLOVE SURG SS PI 7.0 STRL IVOR (GLOVE) ×8 IMPLANT
GOWN STRL NON-REIN LRG LVL3 (GOWN DISPOSABLE) ×8 IMPLANT
GOWN STRL REIN XL XLG (GOWN DISPOSABLE) ×4 IMPLANT
GRAFT GORETEX STRT 4-7X45 (Vascular Products) ×2 IMPLANT
KIT BASIN OR (CUSTOM PROCEDURE TRAY) ×2 IMPLANT
KIT ROOM TURNOVER OR (KITS) ×2 IMPLANT
NEEDLE HYPO 25GX1X1/2 BEV (NEEDLE) ×2 IMPLANT
NS IRRIG 1000ML POUR BTL (IV SOLUTION) ×2 IMPLANT
PACK CV ACCESS (CUSTOM PROCEDURE TRAY) ×2 IMPLANT
PAD ARMBOARD 7.5X6 YLW CONV (MISCELLANEOUS) ×4 IMPLANT
PAD ELECT DEFIB RADIOL ZOLL (MISCELLANEOUS) ×2 IMPLANT
SPONGE SURGIFOAM ABS GEL 100 (HEMOSTASIS) ×2 IMPLANT
SUT PROLENE 6 0 BV (SUTURE) ×6 IMPLANT
SUT VIC AB 3-0 SH 27 (SUTURE) ×3
SUT VIC AB 3-0 SH 27X BRD (SUTURE) ×3 IMPLANT
SUT VICRYL 4-0 PS2 18IN ABS (SUTURE) ×4 IMPLANT
SYR CONTROL 10ML LL (SYRINGE) ×2 IMPLANT
TOWEL OR 17X24 6PK STRL BLUE (TOWEL DISPOSABLE) ×2 IMPLANT
TOWEL OR 17X26 10 PK STRL BLUE (TOWEL DISPOSABLE) ×2 IMPLANT
UNDERPAD 30X30 INCONTINENT (UNDERPADS AND DIAPERS) ×2 IMPLANT
WATER STERILE IRR 1000ML POUR (IV SOLUTION) ×2 IMPLANT

## 2012-06-01 NOTE — Anesthesia Preprocedure Evaluation (Signed)
Anesthesia Evaluation  Patient identified by MRN, date of birth, ID band Patient awake    Reviewed: Allergy & Precautions, H&P , NPO status , Patient's Chart, lab work & pertinent test results  Airway Mallampati: II  Neck ROM: full    Dental   Pulmonary COPDformer smoker         Cardiovascular hypertension, + angina + CAD, + Past MI, + Cardiac Stents, + CABG and +CHF + dysrhythmias + Cardiac Defibrillator     Neuro/Psych Anxiety    GI/Hepatic   Endo/Other  Diabetes mellitus-  Renal/GU ESRF and DialysisRenal disease     Musculoskeletal   Abdominal   Peds  Hematology   Anesthesia Other Findings   Reproductive/Obstetrics                           Anesthesia Physical Anesthesia Plan  ASA: IV  Anesthesia Plan: MAC   Post-op Pain Management:    Induction: Intravenous  Airway Management Planned: Simple Face Mask  Additional Equipment:   Intra-op Plan:   Post-operative Plan:   Informed Consent: I have reviewed the patients History and Physical, chart, labs and discussed the procedure including the risks, benefits and alternatives for the proposed anesthesia with the patient or authorized representative who has indicated his/her understanding and acceptance.     Plan Discussed with: CRNA and Surgeon  Anesthesia Plan Comments:         Anesthesia Quick Evaluation

## 2012-06-01 NOTE — Anesthesia Postprocedure Evaluation (Signed)
Anesthesia Post Note  Patient: [name] [name]  Procedure(s) Performed: Procedure(s) (LRB): INSERTION OF ARTERIOVENOUS (AV) GORE-TEX GRAFT ARM (Right)  Anesthesia type: MAC  Patient location: PACU  Post pain: Pain level controlled and Adequate analgesia  Post assessment: Post-op Vital signs reviewed, Patient's Cardiovascular Status Stable and Respiratory Function Stable  Last Vitals:  Filed Vitals:   06/01/12 1045  BP: 149/63  Pulse: 77  Temp: 36.5 C  Resp: 16    Post vital signs: Reviewed and stable  Level of consciousness: awake, alert  and oriented  Complications: No apparent anesthesia complications

## 2012-06-01 NOTE — Progress Notes (Signed)
SPOKE WITH [facility] REP , BRIAN SMALL MADE HIM AWARE OF PATIENT BEING 730 SURGERY TODAY.  BRIAN SMALL WILL SEE PATIENT IN OR.

## 2012-06-01 NOTE — H&P (View-Only) (Signed)
Vascular and Vein Specialist of [facility]  Patient name: [name] [name] MRN: [id number] DOB: [birth year] Sex: male  REASON FOR VISIT: evaluate for new access.  HPI: [name] [name] is a 78 y.o. male status post right above-the-knee amputation. He has had attempted fistulas in both arms and currently dialyzes with a dietetic catheter on the right side. He was sent to be evaluated for further access. He's had no recent uremic symptoms. Typically he denies nausea, vomiting, fatigue, anorexia, or palpitations.   REVIEW OF SYSTEMS: Arly.Keller ] denotes positive finding; [  ] denotes negative finding  CARDIOVASCULAR:  [ ]  chest pain   [ ]  dyspnea on exertion    CONSTITUTIONAL:  [ ]  fever   [ ]  chills  PHYSICAL EXAM: Filed Vitals:   05/24/12 1550  BP: 212/103  Pulse: 82  Resp: 18  Height: 5\' 7"  (1.702 m)  Weight: 158 lb (71.668 kg)  SpO2: 95%   Body mass index is 24.75 kg/(m^2). GENERAL: The patient is a well-nourished male, in no acute distress. The vital signs are documented above. CARDIOVASCULAR: There is a regular rate and rhythm  PULMONARY: There is good air exchange bilaterally without wheezing or rales. He has a pacemaker in the left infraclavicular region. He has no significant arm swelling. He has palpable radial pulses although somewhat diminished.  I have reviewed his vein mapping today which shows that he does not have a forearm or upper arm cephalic vein or basilic vein on the right which would be usable for fistula. He does have a reasonable sized basilic vein in the left arm however he has a pacemaker on the left side and I would not recommend access in the left arm.  MEDICAL ISSUES: I've recommended we place a right arm graft. I've explained that I would not place access in the left arm because of his pacemaker. I've also explained that he does not have vein in the right arm such the fistula could be placed. The only other option would be a thigh graft which he is opposed to but I think  he has a reasonable chance of having success with the right arm graft. Currently is not willing to schedule surgery hopefully he'll call to the near future to schedule this on a Tuesday or Thursday which is a nondialysis day.  Toron Bowring S V[facility] and Vein Specialists of [facility]: [phone]

## 2012-06-01 NOTE — Op Note (Signed)
NAME: [name] [name]   MRN: [id number] DOB: 29-Jun-1934    DATE OF OPERATION: 06/01/2012  PREOP DIAGNOSIS: Stage V chronic kidney disease  POSTOP DIAGNOSIS: Same  PROCEDURE: Placement of new right upper arm AV graft  SURGEON: Di Kindle. Edilia Bo, MD, FACS  ASSIST: Della Goo PA  ANESTHESIA: local with sedation   EBL: minimal  INDICATIONS: [name] [name] is a 78 y.o. male who presents for placement of new access.  FINDINGS: Brachial artery was calcified but patent. He had a diminished ulnar signal with the Doppler preoperatively with a diminished radial pulse. The vein was 6 mm.  TECHNIQUE: The patient was brought to the operating room and sedated by anesthesia. The right upper extremity was prepped and draped in the usual sterile fashion. An longitudinal incision was made just above the antecubital level after the skin was anesthetized. Here the brachial artery was dissected free beneath the fascia. It was calcified but patent. The adjacent veins were quite small. I elected to place an upper arm graft. A separate longitudinal incision was made beneath the axilla. The high brachial vein was dissected free and was ligated distally. It was approximately 6 mm vein. A 4-7 mm graft was tunneled between the 2 incisions. The patient was heparinized. The brachial artery was clamped proximally and distally and a longitudinal arteriotomy was made. A segment of the 4 mm the graft was excised the graft slightly spatulated and sewn end to side to the artery using continuous 6-0 Prolene suture. The graft was pulled to the appropriate length for anastomosis to the high brachial vein. The graft was cut to the appropriate length, spatulated, and sewn end-to-end to the vein using continuous 6-0 Prolene suture. At the completion was an excellent thrill in the graft. There was a good radial signal with the Doppler. The wounds were closed with 2 deep layers of 3-0 Vicryl and the skin closed with 4-0 Vicryl.  Dermabond was applied. The patient tolerated the procedure well and was transferred to the recovery room in stable condition. All needle and sponge counts were correct.  Waverly Ferrari, MD, FACS Vascular and Vein Specialists of [facility]  DATE OF DICTATION:   06/01/2012

## 2012-06-01 NOTE — Interval H&P Note (Signed)
History and Physical Interval Note:  06/01/2012 7:17 AM  [name] [name]  has presented today for surgery, with the diagnosis of End Stage Renal Disease  The various methods of treatment have been discussed with the patient and family. After consideration of risks, benefits and other options for treatment, the patient has consented to: INSERTION OF RIGHT ARTERIOVENOUS (AV) GORE-TEX GRAFT ARM (Right) as a surgical intervention .  The patient's history has been reviewed, patient examined, no change in status, stable for surgery.  I have reviewed the patients' chart and labs.  Questions were answered to the patient's satisfaction.     [name] [name] S

## 2012-06-01 NOTE — Transfer of Care (Signed)
Immediate Anesthesia Transfer of Care Note  Patient: [name] [name]  Procedure(s) Performed: Procedure(s) (LRB): INSERTION OF ARTERIOVENOUS (AV) GORE-TEX GRAFT ARM (Right)  Patient Location: PACU  Anesthesia Type: MAC  Level of Consciousness: awake, alert  and oriented  Airway & Oxygen Therapy: Patient Spontanous Breathing and Patient connected to nasal cannula oxygen  Post-op Assessment: Report given to PACU RN, Post -op Vital signs reviewed and stable and Patient moving all extremities  Post vital signs: Reviewed and stable  Complications: No apparent anesthesia complications

## 2012-06-02 ENCOUNTER — Encounter (HOSPITAL_COMMUNITY): Payer: Self-pay | Admitting: Vascular Surgery

## 2012-06-02 LAB — POCT I-STAT 4, (NA,K, GLUC, HGB,HCT)
Glucose, Bld: 94 mg/dL (ref 70–99)
HCT: 36 % — ABNORMAL LOW (ref 39.0–52.0)
Potassium: 4.5 mEq/L (ref 3.5–5.1)

## 2013-07-01 ENCOUNTER — Inpatient Hospital Stay (HOSPITAL_COMMUNITY): Payer: PRIVATE HEALTH INSURANCE

## 2013-07-01 ENCOUNTER — Encounter (HOSPITAL_COMMUNITY): Payer: Self-pay | Admitting: Nephrology

## 2013-07-01 ENCOUNTER — Inpatient Hospital Stay (HOSPITAL_COMMUNITY)
Admission: AD | Admit: 2013-07-01 | Discharge: 2013-07-05 | DRG: 280 | Disposition: A | Discharge status: Skilled Nursing Facility | Payer: PRIVATE HEALTH INSURANCE | Source: Other Acute Inpatient Hospital | Attending: Internal Medicine | Admitting: Internal Medicine

## 2013-07-01 DIAGNOSIS — J9601 Acute respiratory failure with hypoxia: Secondary | ICD-10-CM | POA: Diagnosis present

## 2013-07-01 DIAGNOSIS — G9340 Encephalopathy, unspecified: Secondary | ICD-10-CM | POA: Diagnosis present

## 2013-07-01 DIAGNOSIS — Z7901 Long term (current) use of anticoagulants: Secondary | ICD-10-CM

## 2013-07-01 DIAGNOSIS — I4891 Unspecified atrial fibrillation: Secondary | ICD-10-CM | POA: Diagnosis not present

## 2013-07-01 DIAGNOSIS — D539 Nutritional anemia, unspecified: Secondary | ICD-10-CM | POA: Diagnosis present

## 2013-07-01 DIAGNOSIS — Z89619 Acquired absence of unspecified leg above knee: Secondary | ICD-10-CM | POA: Insufficient documentation

## 2013-07-01 DIAGNOSIS — J449 Chronic obstructive pulmonary disease, unspecified: Secondary | ICD-10-CM | POA: Diagnosis present

## 2013-07-01 DIAGNOSIS — J9819 Other pulmonary collapse: Secondary | ICD-10-CM | POA: Diagnosis present

## 2013-07-01 DIAGNOSIS — Z992 Dependence on renal dialysis: Secondary | ICD-10-CM

## 2013-07-01 DIAGNOSIS — J9600 Acute respiratory failure, unspecified whether with hypoxia or hypercapnia: Secondary | ICD-10-CM

## 2013-07-01 DIAGNOSIS — N186 End stage renal disease: Secondary | ICD-10-CM

## 2013-07-01 DIAGNOSIS — Z87891 Personal history of nicotine dependence: Secondary | ICD-10-CM

## 2013-07-01 DIAGNOSIS — J189 Pneumonia, unspecified organism: Secondary | ICD-10-CM | POA: Diagnosis present

## 2013-07-01 DIAGNOSIS — G2581 Restless legs syndrome: Secondary | ICD-10-CM | POA: Diagnosis present

## 2013-07-01 DIAGNOSIS — E1149 Type 2 diabetes mellitus with other diabetic neurological complication: Secondary | ICD-10-CM | POA: Diagnosis present

## 2013-07-01 DIAGNOSIS — I214 Non-ST elevation (NSTEMI) myocardial infarction: Principal | ICD-10-CM | POA: Diagnosis present

## 2013-07-01 DIAGNOSIS — I2510 Atherosclerotic heart disease of native coronary artery without angina pectoris: Secondary | ICD-10-CM | POA: Diagnosis present

## 2013-07-01 DIAGNOSIS — E119 Type 2 diabetes mellitus without complications: Secondary | ICD-10-CM

## 2013-07-01 DIAGNOSIS — J810 Acute pulmonary edema: Secondary | ICD-10-CM

## 2013-07-01 DIAGNOSIS — I739 Peripheral vascular disease, unspecified: Secondary | ICD-10-CM | POA: Diagnosis present

## 2013-07-01 DIAGNOSIS — R7401 Elevation of levels of liver transaminase levels: Secondary | ICD-10-CM | POA: Diagnosis present

## 2013-07-01 DIAGNOSIS — R5381 Other malaise: Secondary | ICD-10-CM | POA: Diagnosis present

## 2013-07-01 DIAGNOSIS — E785 Hyperlipidemia, unspecified: Secondary | ICD-10-CM | POA: Diagnosis present

## 2013-07-01 DIAGNOSIS — E1142 Type 2 diabetes mellitus with diabetic polyneuropathy: Secondary | ICD-10-CM | POA: Diagnosis present

## 2013-07-01 DIAGNOSIS — Z951 Presence of aortocoronary bypass graft: Secondary | ICD-10-CM

## 2013-07-01 DIAGNOSIS — I509 Heart failure, unspecified: Secondary | ICD-10-CM | POA: Diagnosis present

## 2013-07-01 DIAGNOSIS — S78119A Complete traumatic amputation at level between unspecified hip and knee, initial encounter: Secondary | ICD-10-CM

## 2013-07-01 DIAGNOSIS — Z95810 Presence of automatic (implantable) cardiac defibrillator: Secondary | ICD-10-CM | POA: Diagnosis present

## 2013-07-01 DIAGNOSIS — J984 Other disorders of lung: Secondary | ICD-10-CM | POA: Diagnosis present

## 2013-07-01 DIAGNOSIS — I10 Essential (primary) hypertension: Secondary | ICD-10-CM

## 2013-07-01 DIAGNOSIS — N400 Enlarged prostate without lower urinary tract symptoms: Secondary | ICD-10-CM | POA: Diagnosis present

## 2013-07-01 DIAGNOSIS — J4489 Other specified chronic obstructive pulmonary disease: Secondary | ICD-10-CM | POA: Diagnosis present

## 2013-07-01 DIAGNOSIS — R0689 Other abnormalities of breathing: Secondary | ICD-10-CM | POA: Diagnosis present

## 2013-07-01 HISTORY — DX: End stage renal disease: N18.6

## 2013-07-01 HISTORY — DX: End stage renal disease: Z99.2

## 2013-07-01 LAB — POCT I-STAT 3, ART BLOOD GAS (G3+)
Acid-Base Excess: 4 mmol/L — ABNORMAL HIGH (ref 0.0–2.0)
Bicarbonate: 29.6 mEq/L — ABNORMAL HIGH (ref 20.0–24.0)
TCO2: 31 mmol/L (ref 0–100)
pO2, Arterial: 114 mmHg — ABNORMAL HIGH (ref 80.0–100.0)

## 2013-07-01 LAB — CBC WITH DIFFERENTIAL/PLATELET
Basophils Relative: 0 % (ref 0–1)
Eosinophils Absolute: 0 10*3/uL (ref 0.0–0.7)
MCH: 33.4 pg (ref 26.0–34.0)
MCHC: 32.9 g/dL (ref 30.0–36.0)
Neutrophils Relative %: 90 % — ABNORMAL HIGH (ref 43–77)
Platelets: 195 10*3/uL (ref 150–400)
RDW: 16.6 % — ABNORMAL HIGH (ref 11.5–15.5)

## 2013-07-01 LAB — GLUCOSE, CAPILLARY
Glucose-Capillary: 279 mg/dL — ABNORMAL HIGH (ref 70–99)
Glucose-Capillary: 243 mg/dL — ABNORMAL HIGH (ref 70–99)

## 2013-07-01 LAB — COMPREHENSIVE METABOLIC PANEL
ALT: 2018 U/L — ABNORMAL HIGH (ref 0–53)
BUN: 55 mg/dL — ABNORMAL HIGH (ref 6–23)
Calcium: 8.6 mg/dL (ref 8.4–10.5)
GFR calc Af Amer: 13 mL/min — ABNORMAL LOW (ref >90)
Glucose, Bld: 288 mg/dL — ABNORMAL HIGH (ref 70–99)
Sodium: 133 mEq/L — ABNORMAL LOW (ref 135–145)
Total Protein: 6.1 g/dL (ref 6.0–8.3)

## 2013-07-01 LAB — APTT: aPTT: 42 seconds — ABNORMAL HIGH (ref 24–37)

## 2013-07-01 LAB — MRSA PCR SCREENING: MRSA by PCR: NEGATIVE

## 2013-07-01 LAB — PROTIME-INR: Prothrombin Time: 19.8 seconds — ABNORMAL HIGH (ref 11.6–15.2)

## 2013-07-01 LAB — TROPONIN I
Troponin I: 3.67 ng/mL (ref <0.30)
Troponin I: 4.86 ng/mL (ref <0.30)

## 2013-07-01 LAB — LACTIC ACID, PLASMA: Lactic Acid, Venous: 2.1 mmol/L (ref 0.5–2.2)

## 2013-07-01 MED ORDER — SODIUM CHLORIDE 0.9 % IV SOLN
1750.0000 mg | Freq: Once | INTRAVENOUS | Status: AC
  Administered 2013-07-01: 1750 mg via INTRAVENOUS
  Filled 2013-07-01: qty 1750

## 2013-07-01 MED ORDER — DEXTROSE 5 % IV SOLN
2.0000 g | Freq: Once | INTRAVENOUS | Status: AC
  Administered 2013-07-01: 2 g via INTRAVENOUS
  Filled 2013-07-01: qty 2

## 2013-07-01 MED ORDER — PENTAFLUOROPROP-TETRAFLUOROETH EX AERO: 1.0000 "application " | INHALATION_SPRAY | CUTANEOUS | Status: DC | PRN

## 2013-07-01 MED ORDER — CHLORHEXIDINE GLUCONATE 0.12 % MT SOLN
15.0000 mL | Freq: Two times a day (BID) | OROMUCOSAL | Status: DC
  Administered 2013-07-02 – 2013-07-05 (×7): 15 mL via OROMUCOSAL
  Filled 2013-07-01 (×10): qty 15

## 2013-07-01 MED ORDER — NEPRO/CARBSTEADY PO LIQD: 237.0000 mL | ORAL | Status: DC | PRN

## 2013-07-01 MED ORDER — SODIUM CHLORIDE 0.9 % IV SOLN: 100.0000 mL | INTRAVENOUS | Status: DC | PRN

## 2013-07-01 MED ORDER — VANCOMYCIN HCL 10 G IV SOLR
1750.0000 mg | Freq: Once | INTRAVENOUS | Status: DC
  Filled 2013-07-01: qty 1750

## 2013-07-01 MED ORDER — LIDOCAINE-PRILOCAINE 2.5-2.5 % EX CREA: 1.0000 "application " | TOPICAL_CREAM | CUTANEOUS | Status: DC | PRN

## 2013-07-01 MED ORDER — HEPARIN (PORCINE) IN NACL 100-0.45 UNIT/ML-% IJ SOLN
1350.0000 [IU]/h | INTRAMUSCULAR | Status: DC
  Administered 2013-07-01: 1000 [IU]/h via INTRAVENOUS
  Administered 2013-07-02 (×2): 1150 [IU]/h via INTRAVENOUS
  Administered 2013-07-03 – 2013-07-05 (×2): 1350 [IU]/h via INTRAVENOUS
  Filled 2013-07-01 (×7): qty 250

## 2013-07-01 MED ORDER — SODIUM CHLORIDE 0.9 % IV SOLN
INTRAVENOUS | Status: DC
  Filled 2013-07-01: qty 1

## 2013-07-01 MED ORDER — SODIUM CHLORIDE 0.9 % IV SOLN
250.0000 mL | INTRAVENOUS | Status: DC | PRN
  Administered 2013-07-01: 10 mL/h via INTRAVENOUS

## 2013-07-01 MED ORDER — BIOTENE DRY MOUTH MT LIQD
15.0000 mL | OROMUCOSAL | Status: DC
  Administered 2013-07-02 – 2013-07-04 (×12): 15 mL via OROMUCOSAL

## 2013-07-01 MED ORDER — HEPARIN SODIUM (PORCINE) 1000 UNIT/ML DIALYSIS
2000.0000 [IU] | INTRAMUSCULAR | Status: DC | PRN
  Filled 2013-07-01: qty 2

## 2013-07-01 MED ORDER — HEPARIN SODIUM (PORCINE) 1000 UNIT/ML DIALYSIS
1000.0000 [IU] | INTRAMUSCULAR | Status: DC | PRN
  Filled 2013-07-01: qty 1

## 2013-07-01 MED ORDER — INSULIN ASPART 100 UNIT/ML ~~LOC~~ SOLN
2.0000 [IU] | SUBCUTANEOUS | Status: DC
  Administered 2013-07-01: 6 [IU] via SUBCUTANEOUS
  Administered 2013-07-02: 4 [IU] via SUBCUTANEOUS
  Administered 2013-07-02: 2 [IU] via SUBCUTANEOUS
  Administered 2013-07-02: 4 [IU] via SUBCUTANEOUS

## 2013-07-01 MED ORDER — LIDOCAINE HCL (PF) 1 % IJ SOLN: 5.0000 mL | INTRAMUSCULAR | Status: DC | PRN

## 2013-07-01 MED ORDER — ALTEPLASE 2 MG IJ SOLR: 2.0000 mg | Freq: Once | INTRAMUSCULAR | Status: AC | PRN

## 2013-07-01 NOTE — Progress Notes (Signed)
CRITICAL VALUE ALERT  Critical value received:  troponon  Date of notification:  07/01/2013  Time of notification:  1550  Critical value read back:yes  Nurse who received alert:  Sabo/Bless Lisenby  MD notified (1st page):  Dr Z./ PCCM  Time of first page:  1550  MD notified (2nd page):  Time of second page:  Responding MD:  Dr Herma Carson  Time MD responded:  1550
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
## 2013-07-01 NOTE — Progress Notes (Signed)
eLink Physician-Brief Progress Note Patient Name: [name] [name] DOB: 09/17/1934 MRN: [id number]  Date of Service  07/01/2013   HPI/Events of Note   Elevated Troponin 3.6 in setting of ESRD on HD NSAID allergy   eICU Interventions   Continue to trend   Intervention Category Intermediate Interventions: Diagnostic test evaluation  Raliegh Scobie 07/01/2013, 4:24 PM
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
## 2013-07-02 ENCOUNTER — Encounter (HOSPITAL_COMMUNITY): Payer: Self-pay | Admitting: Physician Assistant

## 2013-07-02 DIAGNOSIS — I214 Non-ST elevation (NSTEMI) myocardial infarction: Secondary | ICD-10-CM | POA: Diagnosis present

## 2013-07-02 DIAGNOSIS — I2510 Atherosclerotic heart disease of native coronary artery without angina pectoris: Secondary | ICD-10-CM

## 2013-07-02 LAB — GLUCOSE, CAPILLARY
Glucose-Capillary: 261 mg/dL — ABNORMAL HIGH (ref 70–99)
Glucose-Capillary: 184 mg/dL — ABNORMAL HIGH (ref 70–99)
Glucose-Capillary: 172 mg/dL — ABNORMAL HIGH (ref 70–99)
Glucose-Capillary: 165 mg/dL — ABNORMAL HIGH (ref 70–99)
Glucose-Capillary: 297 mg/dL — ABNORMAL HIGH (ref 70–99)

## 2013-07-02 LAB — CBC
HCT: 33.4 % — ABNORMAL LOW (ref 39.0–52.0)
Hemoglobin: 10.6 g/dL — ABNORMAL LOW (ref 13.0–17.0)
MCH: 33.2 pg (ref 26.0–34.0)
MCHC: 31.7 g/dL (ref 30.0–36.0)
MCV: 104.7 fL — ABNORMAL HIGH (ref 78.0–100.0)
RDW: 16.6 % — ABNORMAL HIGH (ref 11.5–15.5)

## 2013-07-02 LAB — BASIC METABOLIC PANEL
BUN: 25 mg/dL — ABNORMAL HIGH (ref 6–23)
Calcium: 9.1 mg/dL (ref 8.4–10.5)
Creatinine, Ser: 2.72 mg/dL — ABNORMAL HIGH (ref 0.50–1.35)
GFR calc Af Amer: 25 mL/min — ABNORMAL LOW (ref >90)
GFR calc non Af Amer: 21 mL/min — ABNORMAL LOW (ref >90)
Glucose, Bld: 182 mg/dL — ABNORMAL HIGH (ref 70–99)
Potassium: 4.1 mEq/L (ref 3.5–5.1)

## 2013-07-02 LAB — TROPONIN I: Troponin I: 3.47 ng/mL (ref <0.30)

## 2013-07-02 MED ORDER — NEPRO/CARBSTEADY PO LIQD
237.0000 mL | ORAL | Status: DC | PRN
  Filled 2013-07-02: qty 237

## 2013-07-02 MED ORDER — DEXTROSE 5 % IV SOLN
2.0000 g | INTRAVENOUS | Status: DC
  Administered 2013-07-02: 2 g via INTRAVENOUS
  Filled 2013-07-02: qty 2

## 2013-07-02 MED ORDER — SODIUM CHLORIDE 0.9 % IV SOLN: 100.0000 mL | INTRAVENOUS | Status: DC | PRN

## 2013-07-02 MED ORDER — HEPARIN SODIUM (PORCINE) 1000 UNIT/ML DIALYSIS
1000.0000 [IU] | INTRAMUSCULAR | Status: DC | PRN
  Filled 2013-07-02: qty 1

## 2013-07-02 MED ORDER — HEPARIN SODIUM (PORCINE) 1000 UNIT/ML DIALYSIS: 2000.0000 [IU] | Freq: Once | INTRAMUSCULAR | Status: DC

## 2013-07-02 MED ORDER — ALTEPLASE 2 MG IJ SOLR
2.0000 mg | Freq: Once | INTRAMUSCULAR | Status: DC | PRN
  Filled 2013-07-02: qty 2

## 2013-07-02 MED ORDER — INSULIN ASPART 100 UNIT/ML ~~LOC~~ SOLN
0.0000 [IU] | Freq: Three times a day (TID) | SUBCUTANEOUS | Status: DC
  Administered 2013-07-03: 8 [IU] via SUBCUTANEOUS
  Administered 2013-07-03 – 2013-07-04 (×3): 15 [IU] via SUBCUTANEOUS
  Administered 2013-07-04: 3 [IU] via SUBCUTANEOUS
  Administered 2013-07-05: 5 [IU] via SUBCUTANEOUS
  Administered 2013-07-05: 11 [IU] via SUBCUTANEOUS

## 2013-07-02 MED ORDER — LIDOCAINE-PRILOCAINE 2.5-2.5 % EX CREA
1.0000 "application " | TOPICAL_CREAM | CUTANEOUS | Status: DC | PRN
  Filled 2013-07-02: qty 5

## 2013-07-02 MED ORDER — ASPIRIN EC 81 MG PO TBEC
81.0000 mg | DELAYED_RELEASE_TABLET | Freq: Every day | ORAL | Status: DC
  Administered 2013-07-03 – 2013-07-05 (×3): 81 mg via ORAL
  Filled 2013-07-02 (×4): qty 1

## 2013-07-02 MED ORDER — INSULIN GLARGINE 100 UNIT/ML ~~LOC~~ SOLN
10.0000 [IU] | Freq: Every day | SUBCUTANEOUS | Status: DC
  Administered 2013-07-02: 10 [IU] via SUBCUTANEOUS
  Filled 2013-07-02 (×2): qty 0.1

## 2013-07-02 MED ORDER — VANCOMYCIN HCL IN DEXTROSE 750-5 MG/150ML-% IV SOLN
750.0000 mg | INTRAVENOUS | Status: DC
  Administered 2013-07-02: 750 mg via INTRAVENOUS
  Filled 2013-07-02: qty 150

## 2013-07-02 MED ORDER — LIDOCAINE HCL (PF) 1 % IJ SOLN: 5.0000 mL | INTRAMUSCULAR | Status: DC | PRN

## 2013-07-02 MED ORDER — PENTAFLUOROPROP-TETRAFLUOROETH EX AERO: 1.0000 "application " | INHALATION_SPRAY | CUTANEOUS | Status: DC | PRN

## 2013-07-02 MED ORDER — ASPIRIN 81 MG PO CHEW
CHEWABLE_TABLET | ORAL | Status: AC
  Filled 2013-07-02: qty 1

## 2013-07-02 MED ORDER — METOPROLOL TARTRATE 1 MG/ML IV SOLN
2.5000 mg | INTRAVENOUS | Status: DC | PRN
  Administered 2013-07-02: 2.5 mg via INTRAVENOUS
  Filled 2013-07-02: qty 5

## 2013-07-02 NOTE — Progress Notes (Signed)
ANTICOAGULATION CONSULT NOTE - Follow Up Consult  Pharmacy Consult for heparin Indication: chest pain/ACS  Allergies  Allergen Reactions  . Morphine And Related Other (See Comments)    "makes me hallucinate"  . Penicillins Rash  . Nsaids Other (See Comments)    "don't really know"    Patient Measurements: Weight: 161 lb 9.6 oz (73.3 kg) Heparin Dosing Weight: 73.3kg  Vital Signs: Temp: 98 F (36.7 C) (07/28 1541) Temp src: Oral (07/28 1541) BP: 97/41 mmHg (07/28 1700) Pulse Rate: 95 (07/28 1700)  Labs:  Recent Labs  07/01/13 1454 07/01/13 2235 07/02/13 0429 07/02/13 1741  HGB 9.8*  --  10.6*  --   HCT 29.8*  --  33.4*  --   PLT 195  --  225  --   APTT 42*  --   --   --   LABPROT 19.8*  --   --   --   INR 1.74*  --   --   --   HEPARINUNFRC  --   --  0.46 0.21*  CREATININE 4.73*  --  2.72*  --   TROPONINI 3.67* 4.86* 3.47*  --     The CrCl is unknown because both a height and weight (above a minimum accepted value) are required for this calculation.  Assessment: 69 YOM transferred from [facility] with an elevated troponin and started on IV heparin. First heparin level was therapeutic, but the recheck was low at 0.221. Spoke with RN on 2600 who reported patient transferred from 2100 today and also went to HD, but she did not receive report that the heparin was held. Heparin level was drawn ~2 hrs late. No bleeding noted.  CBC stable.  Goal of Therapy:  Heparin level 0.3-0.7 units/ml Monitor platelets by anticoagulation protocol: Yes   Plan:  1. Increase heparin drip to 1150 units/hr 2. Recheck HL with morning labs 3. Daily HL and CBC  Thank you for allowing me to take part in this patient's care,  Saffron Busey D. Harrietta Incorvaia, PharmD Clinical Pharmacist Pager: [phone] 07/02/2013 6:47 PM

## 2013-07-02 NOTE — Progress Notes (Signed)
Report given to Tobi Bastos for tranfer to 2601. Informed nurse of patient being sent to dialysis at 11:28am. Kellie Shropshire, RN

## 2013-07-02 NOTE — Procedures (Signed)
I was present at this dialysis session. I have reviewed the session itself and made appropriate changes.   [name] Moselle  MD Pager [phone]    Cell  [phone] 07/02/2013, 12:49 PM

## 2013-07-02 NOTE — Progress Notes (Signed)
Pt was A. Fib on the monitor pt came from dialysis,MD was notified

## 2013-07-02 NOTE — Progress Notes (Addendum)
PULMONARY  / CRITICAL CARE MEDICINE  Name: [name] [name] MRN: [id number] DOB: [birth year]    ADMISSION DATE:  07/01/2013  REFERRING MD : [facility]  PRIMARY SERVICE: PCCM  CHIEF COMPLAINT:  NSTEMI, Hypercarbic Resp Fx  BRIEF PATIENT DESCRIPTION: 79 y/o M with complex medical history who presented to [facility] ER with SOB.  Found to have mild hypercarbia, elevated troponin, questionable LLL infiltrate and was transferred to [facility] for further evaluation.   ON review of imaging LLL infiltrate appears to be chronic atelectasis + pleural scarring noted on CT abd 05/2013  SIGNIFICANT EVENTS / STUDIES:  7/27 - Admit to [facility] with NSTEMI, hypercarbic respiratory fx  LINES / TUBES: R Arm AV Graft   CULTURES: Blood 7/27 >>  ANTIBIOTICS: Vanco (PNA Coverage) 7/27>>> Cefepime (PNA Coverage) 7/27>>>  SUBJECTIVE:   VITAL SIGNS: Temp:  [96.2 F (35.7 C)-99.1 F (37.3 C)] 99.1 F (37.3 C) (07/28 0840) Pulse Rate:  [28-112] 28 (07/28 0800) Resp:  [12-24] 15 (07/28 0800) BP: (87-133)/(43-98) 103/46 mmHg (07/28 0800) SpO2:  [76 %-100 %] 76 % (07/28 0800) FiO2 (%):  [40 %-60 %] 40 % (07/28 0600) Weight:  [158 lb 4.6 oz (71.8 kg)-168 lb 4.8 oz (76.34 kg)] 168 lb 4.8 oz (76.34 kg) (07/28 0600)  HEMODYNAMICS:    VENTILATOR SETTINGS: Vent Mode:  [-] BIPAP FiO2 (%):  [40 %-60 %] 40 % Set Rate:  [15 bmp] 15 bmp PEEP:  [4 cmH20] 4 cmH20  INTAKE / OUTPUT: Intake/Output     07/27 0701 - 07/28 0700 07/28 0701 - 07/29 0700   I.V. (mL/kg) 197.3 (2.6)    IV Piggyback 50    Total Intake(mL/kg) 247.3 (3.2)    Other 3000    Total Output 3000 0   Net -2752.7 0          PHYSICAL EXAMINATION: General:  Chronically ill appearing adult male Neuro:  Speech normal, grossly nonfocal HEENT:  NCAT Cardiovascular:  RRR Lungs:  Markedly diminished on L, R clear Abdomen:  Soft, non tender Musculoskeletal:  RLE AKA Skin:  Warm/dry  LABS:  Recent Labs Lab 07/01/13 1454 07/01/13 2235 07/01/13 2347  07/02/13 0429  HGB 9.8*  --   --  10.6*  WBC 12.1*  --   --  11.2*  PLT 195  --   --  225  NA 133*  --   --  140  K 4.2  --   --  4.1  CL 88*  --   --  97  CO2 26  --   --  23  GLUCOSE 288*  --   --  182*  BUN 55*  --   --  25*  CREATININE 4.73*  --   --  2.72*  CALCIUM 8.6  --   --  9.1  AST 1848*  --   --   --   ALT 2018*  --   --   --   ALKPHOS 90  --   --   --   BILITOT 0.2*  --   --   --   PROT 6.1  --   --   --   ALBUMIN 2.8*  --   --   --   APTT 42*  --   --   --   INR 1.74*  --   --   --   LATICACIDVEN 2.1  --   --   --   TROPONINI 3.67* 4.86*  --  3.47*  PROCALCITON 13.69  --   --   --  PHART  --   --  7.385  --   PCO2ART  --   --  49.3*  --   PO2ART  --   --  114.0*  --     Recent Labs Lab 07/01/13 1608 07/01/13 1927 07/01/13 2355 07/02/13 0335 07/02/13 0729  GLUCAP 243* 112* 147* 184* 172*    CXR: no new film today   ASSESSMENT / PLAN:  PULMONARY A: Hypercarbic Respiratory Failure, resolved Effusions & LLL atx/pleural scarring are chronic - note CT abd 05/2013 P:   -wean O2 supplementation as tolerated -pulmonary hygiene -abx see ID  CARDIOVASCULAR A:  NSTEMI vs trop leak CAD  S/p AICD  P:  -heparin gtt -add aspirin today -consult cardiology today -tele monitor -Add bb if pressures tolerate, will defer to cardiology  RENAL A:   ESRD - on HD m/w/f in [place] Hx BPH- report of chronic foley catheter but unclear if makes urine P:   -HD per Nephrology   GASTROINTESTINAL A:   No acute issues P:   -Start carb modified diet today  HEMATOLOGIC A:  Anemia - macrocytic P:  -SCD to L leg, on full dose heparin infusion -check B12, folate levels for macrocytosis -supplement folate   INFECTIOUS A:   Rule out PNA, high pct & WC with left shift P:   -empiric abx -?chronicity of airspace disease -Obtain HD records from [place] to find why he was on ertapenem? -Continue vanc/cefepime   ENDOCRINE A:   DM - with known  neuropathy P:   -SSI qAC/HS -Restart 10 units QHS lantus  NEUROLOGIC A:   Acute Encephalopathy - in setting of hypercarbic resp fx, resolved P:   -supportive care, monitor mental status  TODAY'S SUMMARY: transfer to stepdown unit, remain on PCCM service, consult cardiology   Levert Feinstein, MD Family Medicine PGY-2   I have interviewed and examined the patient and reviewed the database. I have formulated the assessment and plan as reflected in the note above with amendments made by me.   Transfer to SDU today. Will leave on PCCM service for now. Might be appropriate for TRH transfer 7/29. Cards to see today.   Billy Fischer, MD;  PCCM service; Mobile [phone]

## 2013-07-02 NOTE — Progress Notes (Signed)
Subjective: Looks a lot better, 3kg off w HD last night, BP improved with fluid removal  Physical Exam:  Blood pressure 103/46, pulse 28, temperature 99.1 F (37.3 C), temperature source Axillary, resp. rate 15, weight 76.34 kg (168 lb 4.8 oz), SpO2 76.00%. Gen: frail elderly WM on venti mask, looks much better Neck: JVD better Chest: bibasilar soft rales and dec'd BS CV: regular, no murmur or gallop Abdomen: soft, marked abd obesity, liver down 4 cm  Ext: no edema, R AKA   Outpatient HD (MWF [place]) 3.5hrs  F180   Dry wt 75.5kg    RUA AVG    Heparin 2000 EPO 4800     Hect/Venofer none   Impression  1 Acute resp failure / basilar pulm infiltrates- suspect fluid, possible PNA also, much better after HD yest 2 CAD/CABG/ICD- not sure of EF, no echo on file here, prob low  3 ESRD, resume MWF hd 4 HTN- may resume coreg, BP better 5 Anemia- aranesp w HD today 6 Sec HPT- cont phoslo, no vit D 7 PVD s/p R AKA  8 DM2 on insulin  Plan- HD again today upstairs, remove more volume,  Vinson Moselle  MD Pager [phone]    Cell  [phone] 07/02/2013, 9:21 AM  Recent Labs Lab 07/01/13 1454 07/02/13 0429  NA 133* 140  K 4.2 4.1  CL 88* 97  CO2 26 23  GLUCOSE 288* 182*  BUN 55* 25*  CREATININE 4.73* 2.72*  CALCIUM 8.6 9.1   Recent Labs Lab 07/01/13 1454 07/02/13 0429  WBC 12.1* 11.2*  NEUTROABS 10.9*  --   HGB 9.8* 10.6*  HCT 29.8* 33.4*  MCV 101.7* 104.7*  PLT 195 225
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
## 2013-07-02 NOTE — Progress Notes (Signed)
ANTICOAGULATION and ANTIBIOTIC CONSULT NOTE - Follow Up Consult  Pharmacy Consult for Heparin; Vancomycin/Cefepime Indication: chest pain/ACS, HCAP  Allergies  Allergen Reactions  . Morphine And Related Other (See Comments)    "makes me hallucinate"  . Penicillins Rash  . Nsaids Other (See Comments)    "don't really know"    Patient Measurements: Weight: 161 lb 9.6 oz (73.3 kg) Ht 170 cm IBW 65.9 kg Heparin Dosing Weight: 73.3 kg  Vital Signs: Temp: 98.8 F (37.1 C) (07/28 1139) Temp src: Oral (07/28 1139) BP: 126/60 mmHg (07/28 1139) Pulse Rate: 84 (07/28 1139)  Labs:  Recent Labs  07/01/13 1454 07/01/13 2235 07/02/13 0429  HGB 9.8*  --  10.6*  HCT 29.8*  --  33.4*  PLT 195  --  225  APTT 42*  --   --   LABPROT 19.8*  --   --   INR 1.74*  --   --   HEPARINUNFRC  --   --  0.46  CREATININE 4.73*  --  2.72*  TROPONINI 3.67* 4.86* 3.47*    The CrCl is unknown because both a height and weight (above a minimum accepted value) are required for this calculation.  Assessment: 89 YOM nursing home resident transferred from [facility] with elevated troponin started on IV heparin and LLL infiltrate started on Vancomycin and Cefepime (had been on Invanz and Rocephin per nursing home [facility]). Patient has ESRD and HD schedule is being resumed every MWF per Nephrology.   Heparin level is therapeutic this AM on 1000 units/hr.  Hgb is stable. No overt bleeding noted.   Vancomycin and Cefepime day #2. WBC 11.2. No cultures. MRSA pcr negative. Last HD 7/27 off schedule. Resuming HD MWF starting today. Received vancomycin loading dose of 1750 mg 7/27. Cefepime 2 g on 7/27.   Goal of Therapy:  Heparin level 0.3-0.7 units/ml Pre-HD Vancomycin level 15-25 mcg/mL Monitor platelets by anticoagulation protocol: Yes  Plan:  1. Continue Heparin 1000 units/hr.  2. Repeat Heparin level in 8 hours to confirm (1600). 3. Vancomycin 750 mg IV post-HD starting today. 4. Cefepime 2g IV  post-HD. 5. Monitor clinical status, HD schedule, and cultures if ordered. Follow-up plan for duration of therapy.  6. Pre-HD vancomycin levels as indicated.   Link Snuffer, PharmD, BCPS Clinical Pharmacist [phone] 07/02/2013,11:48 AM

## 2013-07-03 ENCOUNTER — Inpatient Hospital Stay (HOSPITAL_COMMUNITY): Payer: PRIVATE HEALTH INSURANCE

## 2013-07-03 DIAGNOSIS — I10 Essential (primary) hypertension: Secondary | ICD-10-CM

## 2013-07-03 LAB — GLUCOSE, CAPILLARY
Glucose-Capillary: 292 mg/dL — ABNORMAL HIGH (ref 70–99)
Glucose-Capillary: 351 mg/dL — ABNORMAL HIGH (ref 70–99)
Glucose-Capillary: 240 mg/dL — ABNORMAL HIGH (ref 70–99)

## 2013-07-03 LAB — BASIC METABOLIC PANEL
BUN: 24 mg/dL — ABNORMAL HIGH (ref 6–23)
CO2: 21 mEq/L (ref 19–32)
Calcium: 9.1 mg/dL (ref 8.4–10.5)
Creatinine, Ser: 2.18 mg/dL — ABNORMAL HIGH (ref 0.50–1.35)
Glucose, Bld: 295 mg/dL — ABNORMAL HIGH (ref 70–99)

## 2013-07-03 LAB — PROTIME-INR: INR: 1.3 (ref 0.00–1.49)

## 2013-07-03 LAB — HEPARIN LEVEL (UNFRACTIONATED): Heparin Unfractionated: 0.4 IU/mL (ref 0.30–0.70)

## 2013-07-03 MED ORDER — INSULIN GLARGINE 100 UNIT/ML ~~LOC~~ SOLN
15.0000 [IU] | Freq: Every day | SUBCUTANEOUS | Status: DC
  Administered 2013-07-03: 15 [IU] via SUBCUTANEOUS
  Filled 2013-07-03 (×2): qty 0.15

## 2013-07-03 MED ORDER — HEPARIN BOLUS VIA INFUSION
2000.0000 [IU] | Freq: Once | INTRAVENOUS | Status: AC
  Administered 2013-07-03: 2000 [IU] via INTRAVENOUS
  Filled 2013-07-03: qty 2000

## 2013-07-03 MED ORDER — CARVEDILOL 6.25 MG PO TABS
6.2500 mg | ORAL_TABLET | Freq: Two times a day (BID) | ORAL | Status: DC
  Administered 2013-07-03 – 2013-07-05 (×4): 6.25 mg via ORAL
  Filled 2013-07-03 (×6): qty 1

## 2013-07-03 MED ORDER — WARFARIN SODIUM 5 MG PO TABS
5.0000 mg | ORAL_TABLET | Freq: Once | ORAL | Status: AC
  Administered 2013-07-03: 5 mg via ORAL
  Filled 2013-07-03: qty 1

## 2013-07-03 MED ORDER — DEXTROSE 5 % IV SOLN
1.0000 g | INTRAVENOUS | Status: DC
  Administered 2013-07-03 – 2013-07-05 (×3): 1 g via INTRAVENOUS
  Filled 2013-07-03 (×4): qty 10

## 2013-07-03 NOTE — Progress Notes (Signed)
Inpatient Diabetes Program Recommendations  AACE/ADA: New Consensus Statement on Inpatient Glycemic Control (2013)  Target Ranges:  Prepandial:   less than 140 mg/dL      Peak postprandial:   less than 180 mg/dL (1-2 hours)      Critically ill patients:  140 - 180 mg/dL   Glucose high in high 200's and mid 300's.  Inpatient Diabetes Program Recommendations Insulin - Basal: Pt takes 40 units lantus at home.  Please consider startiing with 20 units tonight.  Thank you, Lenor Coffin, RN, CNS, Diabetes Coordinator [phone])

## 2013-07-03 NOTE — Progress Notes (Signed)
   Subjective:  Denies CP or dyspnea   Objective:  Filed Vitals:   07/03/13 0600 07/03/13 0623 07/03/13 0700 07/03/13 0736  BP: 114/75  112/58   Pulse: 95  157 91  Temp:      TempSrc:      Resp: 15  25 23   Weight:  160 lb 0.9 oz (72.6 kg)    SpO2: 100%  100% 100%    Intake/Output from previous day:  Intake/Output Summary (Last 24 hours) at 07/03/13 0745 Last data filed at 07/02/13 2300  Gross per 24 hour  Intake 155.34 ml  Output   2860 ml  Net -2704.66 ml    Physical Exam: Physical exam: Well-developed chronically ill in no acute distress.  Skin is warm and dry.  HEENT is normal.  Neck is supple.  Chest with basilar crackles and rhonchi Cardiovascular exam is irregular Abdominal exam nontender or distended. No masses palpated. Extremity shows no edema; s/p aka neuro grossly intact    Lab Results: Basic Metabolic Panel:  Recent Labs  11/91/47 0429 07/03/13 0530  NA 140 135  K 4.1 5.1  CL 97 94*  CO2 23 21  GLUCOSE 182* 295*  BUN 25* 24*  CREATININE 2.72* 2.18*  CALCIUM 9.1 9.1   CBC:  Recent Labs  07/01/13 1454 07/02/13 0429 07/03/13 0530  WBC 12.1* 11.2* 10.5  NEUTROABS 10.9*  --   --   HGB 9.8* 10.6* 11.0*  HCT 29.8* 33.4* 36.6*  MCV 101.7* 104.7* 106.1*  PLT 195 225 270   Cardiac Enzymes:  Recent Labs  07/01/13 1454 07/01/13 2235 07/02/13 0429  TROPONINI 3.67* 4.86* 3.47*     Assessment/Plan:  1 non-ST elevation myocardial infarction-the patient's troponin is elevated but he has not complained of chest pain which has been present at the time of his prior infarcts. Question if elevated troponin has contribution from end-stage renal disease and possible pneumonia. Given his overall medical condition and comorbidities I would favor conservative therapy. Continue aspirin. Add beta blocker. No statin given significantly elevated liver functions. No plavix as he will need Coumadin for atrial fibrillation. 2 paroxysmal atrial  fibrillation-the patient has developed atrial fibrillation. He has multiple embolic risk factors. I would recommend Coumadin long-term. I will add carvedilol 6.25 mg by mouth twice a day both for his infarct and for rate control. I would like to amiodarone but I will avoid this at present given significantly elevated liver functions. Check echocardiogram. 3 possible pneumonia-continue antibiotics. 4 end-stage renal disease-dialysis per nephrology. 5 coronary artery disease-continue aspirin. 6 prior ICD-he will followup with his cardiologist in [place]. 7 COPD-management per pulmonary.  Olga Millers 07/03/2013, 7:45 AM

## 2013-07-03 NOTE — Progress Notes (Signed)
PULMONARY  / CRITICAL CARE MEDICINE  Name: ZARIAH JOST MRN: [id number] DOB: [birth year]    ADMISSION DATE:  07/01/2013  REFERRING MD : [facility]  PRIMARY SERVICE: PCCM  CHIEF COMPLAINT:  NSTEMI, Hypercarbic Resp Fx  BRIEF PATIENT DESCRIPTION: 79 y/o M with complex medical history who presented to [facility] ER with SOB.  Found to have mild hypercarbia, elevated troponin, questionable LLL infiltrate and was transferred to [facility] for further evaluation.   ON review of imaging LLL infiltrate appears to be chronic atelectasis + pleural scarring noted on CT abd 05/2013  SIGNIFICANT EVENTS / STUDIES:  7/27 - Admit to [facility] with NSTEMI, hypercarbic respiratory fx 7/280 cards evaluation, conservative approach  LINES / TUBES: R Arm AV Graft   CULTURES: Blood 7/27 >>  ANTIBIOTICS: Vanco (PNA Coverage) 7/27>>>7/29 Cefepime (PNA Coverage) 7/27>>>  SUBJECTIVE:   VITAL SIGNS: Temp:  [97.9 F (36.6 C)-98.8 F (37.1 C)] 98 F (36.7 C) (07/29 1100) Pulse Rate:  [65-157] 77 (07/29 1100) Resp:  [15-29] 28 (07/29 1100) BP: (78-142)/(35-91) 110/52 mmHg (07/29 1100) SpO2:  [99 %-100 %] 100 % (07/29 1100) FiO2 (%):  [40 %] 40 % (07/28 2100) Weight:  [72.6 kg (160 lb 0.9 oz)-73.3 kg (161 lb 9.6 oz)] 72.6 kg (160 lb 0.9 oz) (07/29 0623)  HEMODYNAMICS:    VENTILATOR SETTINGS: Vent Mode:  [-]  FiO2 (%):  [40 %] 40 %  INTAKE / OUTPUT: Intake/Output     07/28 0701 - 07/29 0700 07/29 0701 - 07/30 0700   P.O.  356   I.V. (mL/kg) 185.3 (2.6) 40.5 (0.6)   IV Piggyback     Total Intake(mL/kg) 185.3 (2.6) 396.5 (5.5)   Other 2860    Total Output 2860     Net -2674.7 +396.5          PHYSICAL EXAMINATION: General:  Chronically ill appearing adult male Neuro:   nonfocal HEENT:  NCAT Cardiovascular:  RRR s1 s 2 Lungs:  reduced Abdomen:  Soft, non tender Musculoskeletal:  RLE AKA Skin:  Warm/dry  LABS:  Recent Labs Lab 07/01/13 1454 07/01/13 2235 07/01/13 2347 07/02/13 0429  07/03/13 0530  HGB 9.8*  --   --  10.6* 11.0*  WBC 12.1*  --   --  11.2* 10.5  PLT 195  --   --  225 270  NA 133*  --   --  140 135  K 4.2  --   --  4.1 5.1  CL 88*  --   --  97 94*  CO2 26  --   --  23 21  GLUCOSE 288*  --   --  182* 295*  BUN 55*  --   --  25* 24*  CREATININE 4.73*  --   --  2.72* 2.18*  CALCIUM 8.6  --   --  9.1 9.1  AST 1848*  --   --   --   --   ALT 2018*  --   --   --   --   ALKPHOS 90  --   --   --   --   BILITOT 0.2*  --   --   --   --   PROT 6.1  --   --   --   --   ALBUMIN 2.8*  --   --   --   --   APTT 42*  --   --   --   --   INR 1.74*  --   --   --   --  LATICACIDVEN 2.1  --   --   --   --   TROPONINI 3.67* 4.86*  --  3.47*  --   PROCALCITON 13.69  --   --   --   --   PHART  --   --  7.385  --   --   PCO2ART  --   --  49.3*  --   --   PO2ART  --   --  114.0*  --   --     Recent Labs Lab 07/01/13 2355 07/02/13 0335 07/02/13 0729 07/02/13 1641 07/02/13 2106  GLUCAP 147* 184* 172* 165* 297*    CXR: Improved int edema, LLL unchanged   ASSESSMENT / PLAN:  PULMONARY A: Hypercarbic Respiratory Failure, resolved Effusions & LLL atx/pleural scarring are chronic - note CT abd 05/2013 P:   -wean O2 supplementation as tolerated -mobilize / IS -have reviewed Ct in past as well, LLL improved since last -pulmonary hygiene -did very well with neg balance, maintain  CARDIOVASCULAR A:  NSTEMI vs trop leak CAD  S/p AICD  P:  -heparin gtt per cards , duration? -aspirin -tele monitor -Add bb per cards  RENAL A:   ESRD - on HD m/w/f in [place] Hx BPH- report of chronic foley catheter but unclear if makes urine P:   -HD per Nephrology -did well with neg balance  GASTROINTESTINAL A:   DM P:   -Start carb modified diet today  HEMATOLOGIC A:  Anemia - macrocytic P:  -SCD to L leg, on full dose heparin infusion -check B12, folate levels for macrocytosis - pending -supplement folate  INFECTIOUS A:   Rule out PNA, high pct &  WC with left shift CT unimpressive as compared top past  / pcxr improved after neg  balance P:   -narrow to ceftraixone -no role pct as clinically improved and will give course abx 7 days  ENDOCRINE A:   DM - with known neuropathy Uncontrolled glu P:   -SSI qAC/HS -Restart 10 units QHS lantus, increase  NEUROLOGIC A:   Acute Encephalopathy - in setting of hypercarbic resp fx, resolved P:   -supportive care, monitor mental status  TODAY'S SUMMARY: increase lantus, narrow abx  Mcarthur Rossetti. Tyson Alias, MD, FACP Pgr: [phone] [facility]

## 2013-07-03 NOTE — Progress Notes (Signed)
ANTICOAGULATION CONSULT NOTE - Follow Up Consult  Pharmacy Consult for Heparin/Coumadin Indication: atrial fibrillation  Allergies  Allergen Reactions  . Morphine And Related Other (See Comments)    "makes me hallucinate"  . Penicillins Rash  . Nsaids Other (See Comments)    "don't really know"    Patient Measurements: Weight: 160 lb 0.9 oz (72.6 kg) Heparin Dosing Weight: 73.3 kg  Vital Signs: Temp: 98 F (36.7 C) (07/29 1100) Temp src: Oral (07/29 1100) BP: 110/52 mmHg (07/29 1100) Pulse Rate: 77 (07/29 1100)  Labs:  Recent Labs  07/01/13 1454 07/01/13 2235 07/02/13 0429 07/02/13 1741 07/03/13 0530  HGB 9.8*  --  10.6*  --  11.0*  HCT 29.8*  --  33.4*  --  36.6*  PLT 195  --  225  --  270  APTT 42*  --   --   --   --   LABPROT 19.8*  --   --   --   --   INR 1.74*  --   --   --   --   HEPARINUNFRC  --   --  0.46 0.21* <0.10*  CREATININE 4.73*  --  2.72*  --  2.18*  TROPONINI 3.67* 4.86* 3.47*  --   --     The CrCl is unknown because both a height and weight (above a minimum accepted value) are required for this calculation.   Assessment: 79 y/o M NH resident tx from [place] with SOB, elevated troponin, questionable LLL infiltrate.   Anticoagulation: Heparin/Coumadin for afib and elevated troponins. Baseline INR elevated at 1.74, but no AC noted on NH MAR. Heparin level 0.4 now in goal range. INR 1.3 starting Coumadin today. LFTs extremely elevated.   Goal of Therapy:  Heparin level 0.3-0.7 units/ml INR 2-3 Monitor platelets by anticoagulation protocol: Yes   Plan:  Continue heparin at 1350 units/hr. Next HL in am. Start Coumadin 5mg  po x 1 tonight. Follow INR daily closely with elevated LFTs   Misty Stanley Stillinger 07/03/2013,2:46 PM

## 2013-07-03 NOTE — Progress Notes (Signed)
Subjective: Another 2.8kg off, breathing better, still coughing but says that is chronic  Physical Exam:  Blood pressure 112/58, pulse 157, temperature 98.4 F (36.9 C), temperature source Oral, resp. rate 25, weight 72.6 kg (160 lb 0.9 oz), SpO2 100.00%. Gen: frail elderly WM on venti mask, looks much better Neck: JVD better Chest: poor effort , bibasilar coarse rales and dec'd bs CV: regular, no murmur or gallop Abdomen: soft, marked abd obesity, liver down 4 cm  Ext: no edema, R AKA   Outpatient HD (MWF [facility]) 3.5hrs  F180   Dry wt 75.5kg    RUA AVG    Heparin 2000 EPO 4800     Hect/Venofer none   Impression  1 Acute resp failure / pulm edema / poss PNA- improved, down to dry wt 2 CAD/CABG/ICD- ?EF 3 ESRD, cont MWF hd 4 HTN- may resume coreg, BP better 5 Anemia- cont esa 6 Sec HPT- cont phoslo, no vit D 7 PVD / R AKA  8 DM2 on insulin  Plan- HD in am, epo   Vinson Moselle  MD Pager [phone]    Cell  [phone] 07/03/2013, 7:07 AM  Recent Labs Lab 07/01/13 1454 07/02/13 0429  NA 133* 140  K 4.2 4.1  CL 88* 97  CO2 26 23  GLUCOSE 288* 182*  BUN 55* 25*  CREATININE 4.73* 2.72*  CALCIUM 8.6 9.1   Recent Labs Lab 07/01/13 1454 07/02/13 0429  WBC 12.1* 11.2*  NEUTROABS 10.9*  --   HGB 9.8* 10.6*  HCT 29.8* 33.4*  MCV 101.7* 104.7*  PLT 195 225

## 2013-07-03 NOTE — Progress Notes (Signed)
ANTICOAGULATION CONSULT NOTE  Pharmacy Consult for heparin Indication: chest pain/ACS  Allergies  Allergen Reactions  . Morphine And Related Other (See Comments)    "makes me hallucinate"  . Penicillins Rash  . Nsaids Other (See Comments)    "don't really know"    Patient Measurements: Weight: 160 lb 0.9 oz (72.6 kg) Heparin Dosing Weight: 73.3kg  Vital Signs: Temp: 98.4 F (36.9 C) (07/29 0409) Temp src: Oral (07/29 0409) BP: 114/75 mmHg (07/29 0600) Pulse Rate: 95 (07/29 0600)  Labs:  Recent Labs  07/01/13 1454 07/01/13 2235 07/02/13 0429 07/02/13 1741 07/03/13 0530  HGB 9.8*  --  10.6*  --  11.0*  HCT 29.8*  --  33.4*  --  36.6*  PLT 195  --  225  --  270  APTT 42*  --   --   --   --   LABPROT 19.8*  --   --   --   --   INR 1.74*  --   --   --   --   HEPARINUNFRC  --   --  0.46 0.21* <0.10*  CREATININE 4.73*  --  2.72*  --   --   TROPONINI 3.67* 4.86* 3.47*  --   --     The CrCl is unknown because both a height and weight (above a minimum accepted value) are required for this calculation.  Assessment: 79 y.o. male with chest pain for heparin   Goal of Therapy:  Heparin level 0.3-0.7 units/ml Monitor platelets by anticoagulation protocol: Yes   Plan:  Heparin 2000 units IV bolus, then increase heparin 1350 units/hr Check heparin level in 8 hours.  Geannie Risen, PharmD, BCPS  07/03/2013 6:51 AM

## 2013-07-04 ENCOUNTER — Encounter (HOSPITAL_COMMUNITY): Payer: Self-pay

## 2013-07-04 ENCOUNTER — Inpatient Hospital Stay (HOSPITAL_COMMUNITY): Payer: PRIVATE HEALTH INSURANCE

## 2013-07-04 DIAGNOSIS — I4891 Unspecified atrial fibrillation: Secondary | ICD-10-CM | POA: Diagnosis not present

## 2013-07-04 DIAGNOSIS — I059 Rheumatic mitral valve disease, unspecified: Secondary | ICD-10-CM

## 2013-07-04 DIAGNOSIS — Z95810 Presence of automatic (implantable) cardiac defibrillator: Secondary | ICD-10-CM

## 2013-07-04 LAB — COMPREHENSIVE METABOLIC PANEL
BUN: 43 mg/dL — ABNORMAL HIGH (ref 6–23)
Calcium: 9.3 mg/dL (ref 8.4–10.5)
Creatinine, Ser: 3.71 mg/dL — ABNORMAL HIGH (ref 0.50–1.35)
GFR calc Af Amer: 17 mL/min — ABNORMAL LOW (ref >90)
Glucose, Bld: 286 mg/dL — ABNORMAL HIGH (ref 70–99)
Total Protein: 6.6 g/dL (ref 6.0–8.3)

## 2013-07-04 LAB — CBC WITH DIFFERENTIAL/PLATELET
Basophils Absolute: 0 10*3/uL (ref 0.0–0.1)
HCT: 35.8 % — ABNORMAL LOW (ref 39.0–52.0)
Hemoglobin: 11.6 g/dL — ABNORMAL LOW (ref 13.0–17.0)
Lymphocytes Relative: 12 % (ref 12–46)
Monocytes Absolute: 1 10*3/uL (ref 0.1–1.0)
Monocytes Relative: 12 % (ref 3–12)
Neutro Abs: 6.4 10*3/uL (ref 1.7–7.7)
RBC: 3.46 MIL/uL — ABNORMAL LOW (ref 4.22–5.81)
RDW: 16.6 % — ABNORMAL HIGH (ref 11.5–15.5)
WBC: 8.5 10*3/uL (ref 4.0–10.5)

## 2013-07-04 LAB — GLUCOSE, CAPILLARY
Glucose-Capillary: 191 mg/dL — ABNORMAL HIGH (ref 70–99)
Glucose-Capillary: 395 mg/dL — ABNORMAL HIGH (ref 70–99)
Glucose-Capillary: 275 mg/dL — ABNORMAL HIGH (ref 70–99)

## 2013-07-04 LAB — HEPARIN LEVEL (UNFRACTIONATED): Heparin Unfractionated: 0.35 IU/mL (ref 0.30–0.70)

## 2013-07-04 MED ORDER — LIDOCAINE HCL (PF) 1 % IJ SOLN: 5.0000 mL | INTRAMUSCULAR | Status: DC | PRN

## 2013-07-04 MED ORDER — WARFARIN SODIUM 5 MG PO TABS
5.0000 mg | ORAL_TABLET | Freq: Once | ORAL | Status: AC
  Administered 2013-07-04: 5 mg via ORAL
  Filled 2013-07-04: qty 1

## 2013-07-04 MED ORDER — HEPARIN SODIUM (PORCINE) 1000 UNIT/ML DIALYSIS: 2000.0000 [IU] | Freq: Once | INTRAMUSCULAR | Status: DC

## 2013-07-04 MED ORDER — SODIUM CHLORIDE 0.9 % IV SOLN: 100.0000 mL | INTRAVENOUS | Status: DC | PRN

## 2013-07-04 MED ORDER — INSULIN GLARGINE 100 UNIT/ML ~~LOC~~ SOLN
25.0000 [IU] | Freq: Every day | SUBCUTANEOUS | Status: DC
  Administered 2013-07-04: 25 [IU] via SUBCUTANEOUS
  Filled 2013-07-04 (×2): qty 0.25

## 2013-07-04 MED ORDER — MIDODRINE HCL 5 MG PO TABS
10.0000 mg | ORAL_TABLET | Freq: Two times a day (BID) | ORAL | Status: DC
  Administered 2013-07-04 – 2013-07-05 (×2): 10 mg via ORAL
  Filled 2013-07-04 (×4): qty 2

## 2013-07-04 MED ORDER — PENTAFLUOROPROP-TETRAFLUOROETH EX AERO: 1.0000 "application " | INHALATION_SPRAY | CUTANEOUS | Status: DC | PRN

## 2013-07-04 MED ORDER — HEPARIN SODIUM (PORCINE) 1000 UNIT/ML DIALYSIS: 1000.0000 [IU] | INTRAMUSCULAR | Status: DC | PRN

## 2013-07-04 MED ORDER — ALTEPLASE 2 MG IJ SOLR
2.0000 mg | Freq: Once | INTRAMUSCULAR | Status: DC | PRN
  Filled 2013-07-04: qty 2

## 2013-07-04 MED ORDER — NEPRO/CARBSTEADY PO LIQD: 237.0000 mL | ORAL | Status: DC | PRN

## 2013-07-04 MED ORDER — LIDOCAINE-PRILOCAINE 2.5-2.5 % EX CREA: 1.0000 "application " | TOPICAL_CREAM | CUTANEOUS | Status: DC | PRN

## 2013-07-04 MED ORDER — WARFARIN - PHARMACIST DOSING INPATIENT: Freq: Every day | Status: DC

## 2013-07-04 NOTE — Progress Notes (Signed)
    SUBJECTIVE: No chest pain or SOB.   BP 102/56  Pulse 87  Temp(Src) 97.6 F (36.4 C) (Oral)  Resp 16  Wt 160 lb 7.9 oz (72.8 kg)  BMI 25.13 kg/m2  SpO2 98%  Intake/Output Summary (Last 24 hours) at 07/04/13 0740 Last data filed at 07/04/13 0600  Gross per 24 hour  Intake 1066.5 ml  Output      0 ml  Net 1066.5 ml    PHYSICAL EXAM General: Well developed, well nourished, in no acute distress. Alert and oriented x 3.  Psych:  Good affect, responds appropriately Neck: No JVD. No masses noted.  Lungs: Clear bilaterally with no wheezes or rhonci noted.  Heart: RRR with no murmurs noted. Abdomen: Bowel sounds are present. Soft, non-tender.  Extremities: No lower extremity edema.   LABS: Basic Metabolic Panel:  Recent Labs  16/10/96 0530 07/04/13 0420  NA 135 134*  K 5.1 3.7  CL 94* 94*  CO2 21 25  GLUCOSE 295* 286*  BUN 24* 43*  CREATININE 2.18* 3.71*  CALCIUM 9.1 9.3   CBC:  Recent Labs  07/01/13 1454  07/03/13 0530 07/04/13 0420  WBC 12.1*  < > 10.5 8.5  NEUTROABS 10.9*  --   --  6.4  HGB 9.8*  < > 11.0* 11.6*  HCT 29.8*  < > 36.6* 35.8*  MCV 101.7*  < > 106.1* 103.5*  PLT 195  < > 270 229  < > = values in this interval not displayed. Cardiac Enzymes:  Recent Labs  07/01/13 1454 07/01/13 2235 07/02/13 0429  TROPONINI 3.67* 4.86* 3.47*   Current Meds: . antiseptic oral rinse  15 mL Mouth Rinse Q4H  . aspirin EC  81 mg Oral Daily  . carvedilol  6.25 mg Oral BID WC  . cefTRIAXone (ROCEPHIN)  IV  1 g Intravenous Q24H  . chlorhexidine  15 mL Mouth/Throat BID  . [START ON 07/05/2013] heparin  2,000 Units Dialysis Once in dialysis  . insulin aspart  0-15 Units Subcutaneous TID WC  . insulin glargine  15 Units Subcutaneous QHS     ASSESSMENT AND PLAN:  1. Non-ST elevation myocardial infarction:  The patient's troponin is elevated but he has not complained of chest pain which has been present at the time of his prior infarcts. Question if  elevated troponin has contribution from end-stage renal disease and possible pneumonia. Given his overall medical condition and comorbidities, will continue with conservative therapy. Continue aspirin and beta blocker. No statin given significantly elevated liver functions. No plavix as he will need Coumadin for atrial fibrillation.   2.  Paroxysmal atrial fibrillation: The patient has developed atrial fibrillation. He has multiple embolic risk factors. I would recommend Coumadin long-term. This can be started today if ok with PCCM. He will not need a therapeutic INR before discharge home but will need close follow up in cardiology clinic (Dr. Dulce Sellar in [place]) to follow INR. Will avoid amiodarone at present given significantly elevated liver functions. Echocardiogram today.    3. Coronary artery disease: Continue aspirin, beta blocker.    4. ICD:  He will followup with his cardiologist in [place].   MCALHANY,[name]  7/30/20147:40 AM

## 2013-07-04 NOTE — Progress Notes (Signed)
Patient's family member [name] was contacted and informed that [facility] will be willing to take patient back. CSW confirmed with patient that this is what the family and patient wants. Sherry Fain's contact info: [phone] (home), [phone] (cell - before 4pm).  Roddie Mc, [place], [place], [phone]

## 2013-07-04 NOTE — Progress Notes (Signed)
PULMONARY  / CRITICAL CARE MEDICINE  Name: [name] [name] MRN: [id number] DOB: [birth year]    ADMISSION DATE:  07/01/2013  REFERRING MD : [facility]  PRIMARY SERVICE: PCCM  CHIEF COMPLAINT:  NSTEMI, Hypercarbic Resp Fx  BRIEF PATIENT DESCRIPTION: 79 y/o M with complex medical history who presented to [facility] ER with SOB.  Found to have mild hypercarbia, elevated troponin, questionable LLL infiltrate and was transferred to [facility] for further evaluation.   ON review of imaging LLL infiltrate appears to be chronic atelectasis + pleural scarring noted on CT abd 05/2013  SIGNIFICANT EVENTS / STUDIES:  7/27 - Admit to [facility] with NSTEMI, hypercarbic respiratory fx 7/280 cards evaluation, conservative approach 7/30 HD  LINES / TUBES: R Arm AV Graft   CULTURES: Blood 7/27 >>  ANTIBIOTICS: Vanco (PNA Coverage) 7/27>>>7/29 Cefepime (PNA Coverage) 7/27>>>7/29 Ceftriaxone 7/29>>>plan stop aug 2  SUBJECTIVE: HD on going  VITAL SIGNS: Temp:  [97.5 F (36.4 C)-98.7 F (37.1 C)] 97.6 F (36.4 C) (07/30 0651) Pulse Rate:  [77-104] 104 (07/30 0930) Resp:  [14-30] 14 (07/30 0930) BP: (85-131)/(49-77) 100/53 mmHg (07/30 0930) SpO2:  [95 %-100 %] 100 % (07/30 0930) Weight:  [72.8 kg (160 lb 7.9 oz)-74.3 kg (163 lb 12.8 oz)] 72.8 kg (160 lb 7.9 oz) (07/30 0651)  HEMODYNAMICS:    VENTILATOR SETTINGS:    INTAKE / OUTPUT: Intake/Output     07/29 0701 - 07/30 0700 07/30 0701 - 07/31 0700   P.O. 706    I.V. (mL/kg) 310.5 (4.3)    IV Piggyback 50    Total Intake(mL/kg) 1066.5 (14.6)    Other     Total Output       Net +1066.5            PHYSICAL EXAMINATION: General:  Chronically ill appearing adult male on hd Neuro:   nonfocal exam , equal strength HEENT:  NCAT Cardiovascular:  RRR s1 s 2 Lungs:  IMproved coarseness, reduced bases Abdomen:  Soft, non tender Musculoskeletal:  RLE AKA Skin:  Warm/dry  LABS:  Recent Labs Lab 07/01/13 1454 07/01/13 2235 07/01/13 2347  07/02/13 0429 07/03/13 0530 07/03/13 1606 07/04/13 0420  HGB 9.8*  --   --  10.6* 11.0*  --  11.6*  WBC 12.1*  --   --  11.2* 10.5  --  8.5  PLT 195  --   --  225 270  --  229  NA 133*  --   --  140 135  --  134*  K 4.2  --   --  4.1 5.1  --  3.7  CL 88*  --   --  97 94*  --  94*  CO2 26  --   --  23 21  --  25  GLUCOSE 288*  --   --  182* 295*  --  286*  BUN 55*  --   --  25* 24*  --  43*  CREATININE 4.73*  --   --  2.72* 2.18*  --  3.71*  CALCIUM 8.6  --   --  9.1 9.1  --  9.3  AST 1848*  --   --   --   --   --  369*  ALT 2018*  --   --   --   --   --  1558*  ALKPHOS 90  --   --   --   --   --  100  BILITOT 0.2*  --   --   --   --   --  0.3  PROT 6.1  --   --   --   --   --  6.6  ALBUMIN 2.8*  --   --   --   --   --  2.9*  APTT 42*  --   --   --   --   --   --   INR 1.74*  --   --   --   --  1.30 1.32  LATICACIDVEN 2.1  --   --   --   --   --   --   TROPONINI 3.67* 4.86*  --  3.47*  --   --   --   PROCALCITON 13.69  --   --   --   --   --   --   PHART  --   --  7.385  --   --   --   --   PCO2ART  --   --  49.3*  --   --   --   --   PO2ART  --   --  114.0*  --   --   --   --     Recent Labs Lab 07/02/13 2106 07/03/13 0808 07/03/13 1221 07/03/13 1706 07/03/13 2127  GLUCAP 297* 292* 351* 353* 240*    CXR: Improved int edema, fluid fissure   ASSESSMENT / PLAN:  PULMONARY A: Hypercarbic Respiratory Failure, resolved Effusions & LLL atx/pleural scarring are chronic - note CT abd 05/2013 P:   -wean O2 supplementation as tolerated -mobilize / IS -did very well with neg balance, maintain, on HD now, given noted fluid on pcxr today  CARDIOVASCULAR A:  NSTEMI vs trop leak CAD  S/p AICD  P:  -heparin gtt per cards , coumadin recommended, would await LFT resolution then start coumadin (ALT 1558) -aspirin -tele monitor -bb per cards  RENAL A:   ESRD - on HD m/w/f in [place] Hx BPH- report of chronic foley catheter but unclear if makes urine P:   -HD  now  GASTROINTESTINAL A:   DM P:   -carb modified diet today  HEMATOLOGIC A:  Anemia - macrocytic P:  -SCD to L leg, on full dose heparin infusion, coumadin planned once lft resolve further -check B12, folate levels for macrocytosis - pending -supplement folate  INFECTIOUS A:   Rule out PNA, high pct & WC with left shift CT unimpressive as compared top past  / pcxr improved after neg  balance P:   -narrow to ceftriaxone 7/29, consider stop date total 7 days abx  ENDOCRINE A:   DM - with known neuropathy Uncontrolled glu P:   -SSI qAC/HS -lantus, increase again today  NEUROLOGIC A:   Acute Encephalopathy - in setting of hypercarbic resp fx, resolved P:   -supportive care, monitor mental status HD  TODAY'S SUMMARY: HD, stop date to abx, to triad in am   Mcarthur Rossetti. Tyson Alias, MD, FACP Pgr: [phone] [facility]

## 2013-07-04 NOTE — Progress Notes (Signed)
  Echocardiogram 2D Echocardiogram has been performed.  Cathie Beams 07/04/2013, 4:03 PM

## 2013-07-04 NOTE — Progress Notes (Addendum)
Subjective: No complaints, not SOB, CXR today still showing mild edema/vasc congestion  Physical Exam:  Blood pressure 94/57, pulse 97, temperature 97.6 F (36.4 C), temperature source Oral, resp. rate 14, weight 72.8 kg (160 lb 7.9 oz), SpO2 100.00%. Gen: frail elderly male, no distress Neck: JVD better Chest: poor effort , some crackles at both bases CV: regular, no murmur or gallop Abdomen: soft, marked abd obesity, liver down 4 cm  Ext: no LE edema, R AKA   Outpatient HD (MWF [facility]) 3.5hrs  F180   Dry wt 75.5kg    RUA AVG    Heparin 2000 EPO 4800     Hect/Venofer none   Impression  1. Acute resp failure / pulm edema- still a bit wet but BP's dropping in HD 2. Afib- cardiology recommended coumadin Rx, on coreg for rate control 3. CAD/CABG/ICD- f/b cardiology in [place] 4. ESRD, cont MWF hd 5. Anemia- cont esa 6. Sec HPT- cont phoslo, no vit D 7. PVD / R AKA  8. DM2 on insulin 9. EOL- pt debilitated and declining, approached EOL issues briefly with patient > not interested in considering DNR right now, "I want to live as long as I can"  Plan- HD, add midodrine   Vinson Moselle  MD Pager [phone]    Cell  [phone] 07/04/2013, 10:07 AM  Recent Labs Lab 07/01/13 1454 07/02/13 0429  NA 133* 140  K 4.2 4.1  CL 88* 97  CO2 26 23  GLUCOSE 288* 182*  BUN 55* 25*  CREATININE 4.73* 2.72*  CALCIUM 8.6 9.1   Recent Labs Lab 07/01/13 1454 07/02/13 0429  WBC 12.1* 11.2*  NEUTROABS 10.9*  --   HGB 9.8* 10.6*  HCT 29.8* 33.4*  MCV 101.7* 104.7*  PLT 195 225

## 2013-07-04 NOTE — Procedures (Signed)
I was present at this dialysis session. I have reviewed the session itself and made appropriate changes.   Vinson Moselle  MD Pager [phone]    Cell  [phone] 07/04/2013, 10:07 AM

## 2013-07-04 NOTE — Progress Notes (Signed)
ANTICOAGULATION CONSULT NOTE - Follow Up Consult  Pharmacy Consult for Heparin/Coumadin Indication: atrial fibrillation  Allergies  Allergen Reactions  . Morphine And Related Other (See Comments)    "makes me hallucinate"  . Penicillins Rash  . Nsaids Other (See Comments)    "don't really know"    Patient Measurements: Weight: 157 lb 3 oz (71.3 kg) Heparin Dosing Weight: 73.3 kg  Vital Signs: Temp: 97.7 F (36.5 C) (07/30 1249) Temp src: Oral (07/30 1249) BP: 99/58 mmHg (07/30 1249) Pulse Rate: 94 (07/30 1249)  Labs:  Recent Labs  07/01/13 1454 07/01/13 2235 07/02/13 0429  07/03/13 0530 07/03/13 1606 07/04/13 0420  HGB 9.8*  --  10.6*  --  11.0*  --  11.6*  HCT 29.8*  --  33.4*  --  36.6*  --  35.8*  PLT 195  --  225  --  270  --  229  APTT 42*  --   --   --   --   --   --   LABPROT 19.8*  --   --   --   --  15.9* 16.1*  INR 1.74*  --   --   --   --  1.30 1.32  HEPARINUNFRC  --   --  0.46  < > <0.10* 0.40 0.35  CREATININE 4.73*  --  2.72*  --  2.18*  --  3.71*  TROPONINI 3.67* 4.86* 3.47*  --   --   --   --   < > = values in this interval not displayed.  The CrCl is unknown because both a height and weight (above a minimum accepted value) are required for this calculation.   Assessment: 78 y/o M NH resident tx from [place] with SOB, elevated troponin, questionable LLL infiltrate.   Anticoagulation: Heparin/Coumadin for afib and elevated troponins. Baseline INR elevated at 1.74, but no AC noted on NH MAR. Heparin level now in goal range. INR 1.32 today LFTs extremely elevated.   Goal of Therapy:  Heparin level 0.3-0.7 units/ml INR 2-3 Monitor platelets by anticoagulation protocol: Yes   Plan:  Continue heparin at 1350 units/hr. Next HL in am. Repeat Coumadin 5mg  po x 1 tonight. Follow INR daily closely with elevated LFTs  Thank you. Okey Regal, PharmD [phone]  07/04/2013,1:18 PM

## 2013-07-05 DIAGNOSIS — J810 Acute pulmonary edema: Secondary | ICD-10-CM

## 2013-07-05 DIAGNOSIS — J189 Pneumonia, unspecified organism: Secondary | ICD-10-CM | POA: Diagnosis present

## 2013-07-05 DIAGNOSIS — R0689 Other abnormalities of breathing: Secondary | ICD-10-CM | POA: Diagnosis present

## 2013-07-05 DIAGNOSIS — Z7901 Long term (current) use of anticoagulants: Secondary | ICD-10-CM

## 2013-07-05 LAB — CBC WITH DIFFERENTIAL/PLATELET
Basophils Relative: 0 % (ref 0–1)
Hemoglobin: 12.7 g/dL — ABNORMAL LOW (ref 13.0–17.0)
Lymphs Abs: 1.7 10*3/uL (ref 0.7–4.0)
Monocytes Relative: 11 % (ref 3–12)
Neutro Abs: 7.9 10*3/uL — ABNORMAL HIGH (ref 1.7–7.7)
Neutrophils Relative %: 72 % (ref 43–77)
Platelets: 280 10*3/uL (ref 150–400)
RBC: 3.78 MIL/uL — ABNORMAL LOW (ref 4.22–5.81)

## 2013-07-05 LAB — COMPREHENSIVE METABOLIC PANEL
BUN: 29 mg/dL — ABNORMAL HIGH (ref 6–23)
CO2: 23 mEq/L (ref 19–32)
Calcium: 9.4 mg/dL (ref 8.4–10.5)
Creatinine, Ser: 3.01 mg/dL — ABNORMAL HIGH (ref 0.50–1.35)
GFR calc Af Amer: 22 mL/min — ABNORMAL LOW (ref >90)
GFR calc non Af Amer: 19 mL/min — ABNORMAL LOW (ref >90)
Glucose, Bld: 232 mg/dL — ABNORMAL HIGH (ref 70–99)
Total Bilirubin: 0.3 mg/dL (ref 0.3–1.2)

## 2013-07-05 LAB — GLUCOSE, CAPILLARY: Glucose-Capillary: 217 mg/dL — ABNORMAL HIGH (ref 70–99)

## 2013-07-05 MED ORDER — INSULIN GLARGINE 100 UNIT/ML ~~LOC~~ SOLN
40.0000 [IU] | Freq: Every day | SUBCUTANEOUS | Status: DC
  Filled 2013-07-05: qty 0.4

## 2013-07-05 MED ORDER — CEFDINIR 300 MG PO CAPS: 300.0000 mg | ORAL_CAPSULE | Freq: Two times a day (BID) | ORAL | Status: DC

## 2013-07-05 MED ORDER — FINASTERIDE 5 MG PO TABS
5.0000 mg | ORAL_TABLET | Freq: Every evening | ORAL | Status: DC
  Filled 2013-07-05: qty 1

## 2013-07-05 MED ORDER — MIDODRINE HCL 10 MG PO TABS: 10.0000 mg | ORAL_TABLET | Freq: Two times a day (BID) | ORAL | Status: DC

## 2013-07-05 MED ORDER — ASPIRIN 81 MG PO TBEC: 81.0000 mg | DELAYED_RELEASE_TABLET | Freq: Every day | ORAL | Status: DC

## 2013-07-05 MED ORDER — CARVEDILOL 6.25 MG PO TABS: 6.2500 mg | ORAL_TABLET | Freq: Two times a day (BID) | ORAL | Status: DC

## 2013-07-05 MED ORDER — GABAPENTIN 300 MG PO CAPS
300.0000 mg | ORAL_CAPSULE | Freq: Every morning | ORAL | Status: DC
  Administered 2013-07-05: 300 mg via ORAL
  Filled 2013-07-05: qty 1

## 2013-07-05 MED ORDER — WARFARIN SODIUM 1 MG PO TABS: ORAL_TABLET | ORAL | Status: DC

## 2013-07-05 MED ORDER — CEFDINIR 300 MG PO CAPS: 300.0000 mg | ORAL_CAPSULE | Freq: Two times a day (BID) | ORAL | Status: AC

## 2013-07-05 NOTE — Progress Notes (Signed)
Patient left via [facility].  He is alert and oriented.  He is transferred back to [facility].

## 2013-07-05 NOTE — Discharge Summary (Signed)
Physician Discharge Summary  Patient ID: [name] [name] MRN: [id number] DOB/AGE: 02/24/1934 79 y.o.  Admit date: 07/01/2013 Discharge date: 07/05/2013    Discharge Diagnoses:  Principal Problem:   Acute respiratory failure with hypoxia Active Problems:   ESRD on hemodialysis   DM2 (diabetes mellitus, type 2)   HTN (hypertension)   ICD (implantable cardioverter-defibrillator) in place   CAD (coronary artery disease)   S/P CABG (coronary artery bypass graft)   Non-ST elevation myocardial infarction (NSTEMI), initial episode of care   Atrial fibrillation   Hypercarbia   Acute pulmonary edema   HCAP (healthcare-associated pneumonia)   Warfarin anticoagulation   Transaminitis    Brief Summary: [name] [name] is a 79 y/o M with complex medical history who presented to [facility] ER with SOB. Found to have mild hypercarbia, elevated troponin, questionable LLL infiltrate and was transferred to [facility] for further evaluation.  ON review of imaging LLL infiltrate appears to be chronic atelectasis + pleural scarring noted on CT abd 05/2013  SIGNIFICANT EVENTS / STUDIES:  7/27 - Admit to [facility] with NSTEMI, hypercarbic respiratory fx  7/280 cards evaluation, conservative approach  7/30 HD   LINES / TUBES:  R Arm AV Graft   CULTURES:  Blood 7/27 >>   ANTIBIOTICS:  Vanco (PNA Coverage) 7/27>>>7/29  Cefepime (PNA Coverage) 7/27>>>7/29  Ceftriaxone 7/29>>>plan stop aug 2                                                                    Hospital Summary by Discharge Diagnosis  Hypercarbic Respiratory Failure, resolved  Effusions & LLL atx/pleural scarring are chronic - note CT abd 05/2013 Suspected PNA - HCAP Improved with negative balance, supplemental O2, abx. WIll change rocephin to cefdinir (has pcn allergy but has tol rocephin this admit) x 3 more days. Cont negative balance per renal as tolerated. Supplemental O2 as needed.  NSTEMI - rx with asa, B blocker, heparin gtt.  No won  coumadin for Afib.  Cont hold statin with elevated LFT's.  Afib - coumadin as below - hold today with quick increase INR.  If Afib persists, consider DCCV 3 weeks after therapeutic INR.  He will f/u with Dr. Dulce Sellar in [facility]   ESRD - followed closely by renal.  HD MWF.  Midodrine added this admit and dry weight lowered.     DM - difficult to control.  Cont lantus, SSI   Acute encephalopathy - in setting hypercarbic resp failure.  Now resolved.     Filed Vitals:   07/05/13 0500 07/05/13 0721 07/05/13 0756 07/05/13 1124  BP:  136/63  93/43  Pulse:  99 92 92  Temp:  97.4 F (36.3 C)  97.8 F (36.6 C)  TempSrc:  Oral  Oral  Resp:  17  20  Weight: 159 lb 2.8 oz (72.2 kg)     SpO2:  100%  100%     Discharge Labs  BMET  Recent Labs Lab 07/01/13 1454 07/02/13 0429 07/03/13 0530 07/04/13 0420 07/05/13 0545  NA 133* 140 135 134* 137  K 4.2 4.1 5.1 3.7 4.5  CL 88* 97 94* 94* 99  CO2 26 23 21 25 23   GLUCOSE 288* 182* 295* 286* 232*  BUN 55* 25* 24* 43* 29*  CREATININE 4.73* 2.72* 2.18* 3.71* 3.01*  CALCIUM 8.6 9.1 9.1 9.3 9.4     CBC   Recent Labs Lab 07/03/13 0530 07/04/13 0420 07/05/13 0545  HGB 11.0* 11.6* 12.7*  HCT 36.6* 35.8* 39.4  WBC 10.5 8.5 11.0*  PLT 270 229 280   Anti-Coagulation  Recent Labs Lab 07/01/13 1454 07/03/13 1606 07/04/13 0420 07/05/13 0545  INR 1.74* 1.30 1.32 2.91*          Follow-up Information   Follow up with MUNLEY,BRIAN, MD. Schedule an appointment as soon as possible for a visit in 2 weeks.   Contact information:   [address] [place] [address] [phone]          Medication List    STOP taking these medications       ALPRAZolam 0.25 MG tablet  Commonly known as:  XANAX     clopidogrel 75 MG tablet  Commonly known as:  PLAVIX     guaiFENesin 600 MG 12 hr tablet  Commonly known as:  MUCINEX     HYDROcodone-acetaminophen 5-500 MG per tablet  Commonly known as:  VICODIN     INVANZ  IJ     isosorbide mononitrate 120 MG 24 hr tablet  Commonly known as:  IMDUR     Polyvinyl Alcohol 0.6 % Soln     pravastatin 40 MG tablet  Commonly known as:  PRAVACHOL     traMADol 50 MG tablet  Commonly known as:  ULTRAM     Vitamin D (Ergocalciferol) 50000 UNITS Caps  Commonly known as:  DRISDOL      TAKE these medications       acetaminophen 650 MG suppository  Commonly known as:  TYLENOL  Place 650 mg rectally every 4 (four) hours as needed for fever.     aspirin 81 MG EC tablet  Take 1 tablet (81 mg total) by mouth daily.     calcium acetate 667 MG capsule  Commonly known as:  PHOSLO  Take 1,334 mg by mouth 3 (three) times daily with meals.     carvedilol 6.25 MG tablet  Commonly known as:  COREG  Take 1 tablet (6.25 mg total) by mouth 2 (two) times daily with a meal.     cefdinir 300 MG capsule  Commonly known as:  OMNICEF  Take 1 capsule (300 mg total) by mouth 2 (two) times daily.     feeding supplement (NEPRO CARB STEADY) Liqd  Take 237 mLs by mouth daily.     finasteride 5 MG tablet  Commonly known as:  PROSCAR  Take 5 mg by mouth every evening.     gabapentin 300 MG capsule  Commonly known as:  NEURONTIN  Take 300 mg by mouth every morning.     guaiFENesin-dextromethorphan 100-10 MG/5ML syrup  Commonly known as:  ROBITUSSIN DM  Take 10 mLs by mouth every 6 (six) hours as needed for cough.     insulin glargine 100 UNIT/ML injection  Commonly known as:  LANTUS  Inject 40 Units into the skin at bedtime.     insulin regular 100 units/mL injection  Commonly known as:  NOVOLIN R,HUMULIN R  Inject 2-8 Units into the skin 3 (three) times daily before meals. SSI:  CBG 200-250= 2 units; 251-300= 4 units; 301-350= 6 units; 351-400= 8 units; > 400= call MD     ipratropium-albuterol 0.5-2.5 (3) MG/3ML Soln  Commonly known as:  DUONEB  Take 3 mLs by nebulization every 6 (six) hours. Respiratory  distress     ketoconazole 2 % shampoo  Commonly known as:   NIZORAL  Apply 1 application topically 2 (two) times a week. Saturday and Thursday     levothyroxine 75 MCG tablet  Commonly known as:  SYNTHROID, LEVOTHROID  Take 75 mcg by mouth at bedtime.     midodrine 10 MG tablet  Commonly known as:  PROAMATINE  Take 1 tablet (10 mg total) by mouth 2 (two) times daily with a meal.     multivitamin Tabs tablet  Take 1 tablet by mouth daily.     ondansetron 4 MG tablet  Commonly known as:  ZOFRAN  Take 4 mg by mouth every 4 (four) hours as needed for nausea. nausea     pantoprazole 40 MG tablet  Commonly known as:  PROTONIX  Take 40 mg by mouth daily.     promethazine 25 MG/ML injection  Commonly known as:  PHENERGAN  Inject 12.5 mg into the muscle every 6 (six) hours as needed for nausea or vomiting.     ranitidine 150 MG tablet  Commonly known as:  ZANTAC  Take 150 mg by mouth at bedtime.     warfarin 1 MG tablet  Commonly known as:  COUMADIN  NO coumadin 7/31.  Starting 8/1 2mg  PO daily with INR check NO LATER THAN 8/2.          Disposition: SNF  Discharged Condition: [name] [name] has met maximum benefit of inpatient care and is medically stable and cleared for discharge.  Patient is pending follow up as above.      Time spent on disposition:  Greater than 35 minutes.   SignedDanford Bad, NP 07/05/2013  11:56 AM Pager: [phone] or [phone]  *Care during the described time interval was provided by me and/or other providers on the critical care team. I have reviewed this patient's available data, including medical history, events of note, physical examination and test results as part of my evaluation.   Agree with above Concern remains with coumadin dosing and current LFT Close observation inr, lft required Complete abx oral  No distress, appears to have improved after hd   Mcarthur Rossetti. Tyson Alias, MD, FACP Pgr: [phone] [facility]

## 2013-07-05 NOTE — Progress Notes (Signed)
Subjective: No complaints, not SOB, CXR today still showing mild edema/vasc congestion  Physical Exam:  Blood pressure 94/57, pulse 97, temperature 97.6 F (36.4 C), temperature source Oral, resp. rate 14, weight 72.8 kg (160 lb 7.9 oz), SpO2 100.00%. Gen: frail elderly male, no distress Neck: JVD better Chest: faint basilar crackles bilat, no distress, on RA CV: regular, no murmur or gallop Abdomen: soft, marked abd obesity, liver down 4 cm  Ext: no LE edema, R AKA   Outpatient HD (MWF [place]) 3.5hrs  F180   Dry wt 75.5kg    RUA AVG    Heparin 2000 EPO 4800     Hect/Venofer none   Impression  1. Acute resp failure / pulm edema - resolved, dry wt lowered 2. Afib- cardiology recommended coumadin Rx, on coreg for rate control 3. CAD/CABG/ICD- f/b cardiology in [place], EF 60% 4. ESRD, cont MWF hd 5. Anemia- cont esa 6. Sec HPT- cont phoslo, no vit D 7. PVD / R AKA  8. DM2 on insulin 9. Full code 10. Dispo- came from SNF  Plan-  OK for discharge, added midodrine and lowered dry weight 75.5 > 71.5 kg while here   Vinson Moselle  MD Pager [phone]    Cell  [phone] 07/05/2013, 10:11 AM    Recent Labs Lab 07/01/13 1454 07/02/13 0429  NA 133* 140  K 4.2 4.1  CL 88* 97  CO2 26 23  GLUCOSE 288* 182*  BUN 55* 25*  CREATININE 4.73* 2.72*  CALCIUM 8.6 9.1   Recent Labs Lab 07/01/13 1454 07/02/13 0429  WBC 12.1* 11.2*  NEUTROABS 10.9*  --   HGB 9.8* 10.6*  HCT 29.8* 33.4*  MCV 101.7* 104.7*  PLT 195 225

## 2013-07-05 NOTE — Progress Notes (Signed)
   Subjective:  Denies CP or dyspnea   Objective:  Filed Vitals:   07/05/13 0438 07/05/13 0500 07/05/13 0721 07/05/13 0756  BP:   136/63   Pulse:   99 92  Temp: 98 F (36.7 C)  97.4 F (36.3 C)   TempSrc: Oral  Oral   Resp:   17   Weight:  159 lb 2.8 oz (72.2 kg)    SpO2:   100%     Intake/Output from previous day:  Intake/Output Summary (Last 24 hours) at 07/05/13 4540 Last data filed at 07/05/13 0800  Gross per 24 hour  Intake  185.5 ml  Output   2015 ml  Net -1829.5 ml    Physical Exam: Physical exam: Well-developed chronically ill in no acute distress.  Skin is warm and dry.  HEENT is normal.  Neck is supple.  Chest with diminished BS Cardiovascular exam is irregular Abdominal exam nontender or distended. No masses palpated. Extremity shows no edema; s/p aka neuro grossly intact    Lab Results: Basic Metabolic Panel:  Recent Labs  98/11/91 0420 07/05/13 0545  NA 134* 137  K 3.7 4.5  CL 94* 99  CO2 25 23  GLUCOSE 286* 232*  BUN 43* 29*  CREATININE 3.71* 3.01*  CALCIUM 9.3 9.4   CBC:  Recent Labs  07/04/13 0420 07/05/13 0545  WBC 8.5 11.0*  NEUTROABS 6.4 7.9*  HGB 11.6* 12.7*  HCT 35.8* 39.4  MCV 103.5* 104.2*  PLT 229 280     Assessment/Plan:  1 non-ST elevation myocardial infarction-the patient's troponin is elevated but he has not complained of chest pain which has been present at the time of his prior infarcts. Question if elevated troponin has contribution from end-stage renal disease and possible pneumonia. Given his overall medical condition and comorbidities I would favor conservative therapy. Continue aspirin and beta blocker. No statin given significantly elevated liver functions. No plavix as he will need Coumadin for atrial fibrillation. 2 paroxysmal atrial fibrillation-the patient has developed atrial fibrillation. He has multiple embolic risk factors. I would recommend Coumadin long-term. Continue carvedilol 6.25 mg by mouth  twice a day both for his infarct and for rate control. I would like to add amiodarone but I will avoid this at present given significantly elevated liver functions. He will need very close fu of INR in [place] (already increased to 2.91 after 2 doses of 5 mg; would favor holding today and low dose at DC - 1 to 2 mg daily with INR checked 2 days after DC); DC heparin. Patient can fu with Dr Dulce Sellar in [place]; if atrial fibrillation persists, consider DCCV 3 weeks after INR therapeutic. 3 possible pneumonia-continue antibiotics. 4 end-stage renal disease-dialysis per nephrology. 5 coronary artery disease-continue aspirin. 6 prior ICD-he will followup with his cardiologist in [place]. 7 COPD-management per pulmonary.  [name] [name] 07/05/2013, 8:23 AM

## 2013-07-05 NOTE — Progress Notes (Signed)
Nutrition Brief Note  Patient identified on the Malnutrition Screening Tool (MST) Report for recent weight loss without trying (patient unsure).  Per readings below, patient's weight has been stable.  Wt Readings from Last 10 Encounters:  07/05/13 159 lb 2.8 oz (72.2 kg)  06/01/12 158 lb (71.668 kg)  06/01/12 158 lb (71.668 kg)  05/24/12 158 lb (71.668 kg)  04/10/12 158 lb 4.6 oz (71.8 kg)  04/10/12 158 lb 4.6 oz (71.8 kg)  04/10/12 158 lb 4.6 oz (71.8 kg)  04/10/12 158 lb 4.6 oz (71.8 kg)  03/02/12 169 lb 8 oz (76.885 kg)  12/09/11 144 lb (65.318 kg)    BMI = 27.7 kg/m2 (adjusted for AKA). Patient meets criteria for Overweight based on current BMI.   Current diet order is Carbohydrate Modified Medium Calorie.  Patient consumed approximately 80% of breakfast this AM.  Labs and medications reviewed.   No nutrition interventions warranted at this time. If nutrition issues arise, please consult RD.   Maureen Chatters, RD, LDN Pager #: [phone] After-Hours Pager #: [phone]

## 2013-07-05 NOTE — Progress Notes (Signed)
Patient will be discharged back to [facility] per [place].  Report given to Rico Sheehan, RN.  Patient informed that he will transported per ambulance.  He stated that [facility] has their own transportation and they can picked him back to the facility.    I called the facility and inquired about the transportation as per patient's info.  Felicia, the transport driver, has another appointment and she is not able to pick him up today.  Information relayed to patient.  He verbalized understanding.

## 2013-07-29 ENCOUNTER — Inpatient Hospital Stay (HOSPITAL_COMMUNITY)
Admission: RE | Admit: 2013-07-29 | Discharge: 2013-08-02 | DRG: 291 | Payer: PRIVATE HEALTH INSURANCE | Source: Other Acute Inpatient Hospital | Attending: Internal Medicine | Admitting: Internal Medicine

## 2013-07-29 ENCOUNTER — Encounter (HOSPITAL_COMMUNITY): Payer: Self-pay

## 2013-07-29 DIAGNOSIS — Z992 Dependence on renal dialysis: Secondary | ICD-10-CM

## 2013-07-29 DIAGNOSIS — I2510 Atherosclerotic heart disease of native coronary artery without angina pectoris: Secondary | ICD-10-CM | POA: Diagnosis present

## 2013-07-29 DIAGNOSIS — Z7901 Long term (current) use of anticoagulants: Secondary | ICD-10-CM

## 2013-07-29 DIAGNOSIS — N400 Enlarged prostate without lower urinary tract symptoms: Secondary | ICD-10-CM | POA: Diagnosis present

## 2013-07-29 DIAGNOSIS — I509 Heart failure, unspecified: Secondary | ICD-10-CM | POA: Diagnosis present

## 2013-07-29 DIAGNOSIS — E1149 Type 2 diabetes mellitus with other diabetic neurological complication: Secondary | ICD-10-CM | POA: Diagnosis present

## 2013-07-29 DIAGNOSIS — N186 End stage renal disease: Secondary | ICD-10-CM

## 2013-07-29 DIAGNOSIS — I4891 Unspecified atrial fibrillation: Secondary | ICD-10-CM | POA: Diagnosis present

## 2013-07-29 DIAGNOSIS — I9589 Other hypotension: Secondary | ICD-10-CM | POA: Diagnosis present

## 2013-07-29 DIAGNOSIS — R5381 Other malaise: Secondary | ICD-10-CM | POA: Diagnosis present

## 2013-07-29 DIAGNOSIS — I2589 Other forms of chronic ischemic heart disease: Secondary | ICD-10-CM | POA: Diagnosis present

## 2013-07-29 DIAGNOSIS — Z85820 Personal history of malignant melanoma of skin: Secondary | ICD-10-CM

## 2013-07-29 DIAGNOSIS — S78119A Complete traumatic amputation at level between unspecified hip and knee, initial encounter: Secondary | ICD-10-CM

## 2013-07-29 DIAGNOSIS — Z95810 Presence of automatic (implantable) cardiac defibrillator: Secondary | ICD-10-CM | POA: Diagnosis present

## 2013-07-29 DIAGNOSIS — Z951 Presence of aortocoronary bypass graft: Secondary | ICD-10-CM

## 2013-07-29 DIAGNOSIS — E1142 Type 2 diabetes mellitus with diabetic polyneuropathy: Secondary | ICD-10-CM | POA: Diagnosis present

## 2013-07-29 DIAGNOSIS — R0689 Other abnormalities of breathing: Secondary | ICD-10-CM

## 2013-07-29 DIAGNOSIS — I739 Peripheral vascular disease, unspecified: Secondary | ICD-10-CM | POA: Diagnosis present

## 2013-07-29 DIAGNOSIS — J810 Acute pulmonary edema: Secondary | ICD-10-CM

## 2013-07-29 DIAGNOSIS — I120 Hypertensive chronic kidney disease with stage 5 chronic kidney disease or end stage renal disease: Secondary | ICD-10-CM | POA: Diagnosis present

## 2013-07-29 DIAGNOSIS — N2581 Secondary hyperparathyroidism of renal origin: Secondary | ICD-10-CM | POA: Diagnosis present

## 2013-07-29 DIAGNOSIS — J4489 Other specified chronic obstructive pulmonary disease: Secondary | ICD-10-CM | POA: Diagnosis present

## 2013-07-29 DIAGNOSIS — J9601 Acute respiratory failure with hypoxia: Secondary | ICD-10-CM

## 2013-07-29 DIAGNOSIS — I214 Non-ST elevation (NSTEMI) myocardial infarction: Secondary | ICD-10-CM

## 2013-07-29 DIAGNOSIS — J449 Chronic obstructive pulmonary disease, unspecified: Secondary | ICD-10-CM | POA: Diagnosis present

## 2013-07-29 DIAGNOSIS — Z794 Long term (current) use of insulin: Secondary | ICD-10-CM

## 2013-07-29 DIAGNOSIS — Z8546 Personal history of malignant neoplasm of prostate: Secondary | ICD-10-CM

## 2013-07-29 DIAGNOSIS — G2581 Restless legs syndrome: Secondary | ICD-10-CM | POA: Diagnosis present

## 2013-07-29 DIAGNOSIS — I5033 Acute on chronic diastolic (congestive) heart failure: Principal | ICD-10-CM | POA: Diagnosis present

## 2013-07-29 DIAGNOSIS — R112 Nausea with vomiting, unspecified: Secondary | ICD-10-CM | POA: Diagnosis present

## 2013-07-29 DIAGNOSIS — N390 Urinary tract infection, site not specified: Secondary | ICD-10-CM

## 2013-07-29 DIAGNOSIS — I10 Essential (primary) hypertension: Secondary | ICD-10-CM | POA: Diagnosis present

## 2013-07-29 DIAGNOSIS — J189 Pneumonia, unspecified organism: Secondary | ICD-10-CM

## 2013-07-29 DIAGNOSIS — E119 Type 2 diabetes mellitus without complications: Secondary | ICD-10-CM

## 2013-07-29 DIAGNOSIS — E785 Hyperlipidemia, unspecified: Secondary | ICD-10-CM | POA: Diagnosis present

## 2013-07-29 DIAGNOSIS — D649 Anemia, unspecified: Secondary | ICD-10-CM | POA: Diagnosis present

## 2013-07-29 LAB — GLUCOSE, CAPILLARY: Glucose-Capillary: 111 mg/dL — ABNORMAL HIGH (ref 70–99)

## 2013-07-29 MED ORDER — BIOTENE DRY MOUTH MT LIQD
15.0000 mL | Freq: Two times a day (BID) | OROMUCOSAL | Status: DC
  Administered 2013-07-29 – 2013-08-02 (×7): 15 mL via OROMUCOSAL

## 2013-07-30 ENCOUNTER — Encounter (HOSPITAL_COMMUNITY): Payer: Self-pay | Admitting: Nephrology

## 2013-07-30 DIAGNOSIS — N390 Urinary tract infection, site not specified: Secondary | ICD-10-CM

## 2013-07-30 DIAGNOSIS — I4891 Unspecified atrial fibrillation: Secondary | ICD-10-CM

## 2013-07-30 DIAGNOSIS — I2510 Atherosclerotic heart disease of native coronary artery without angina pectoris: Secondary | ICD-10-CM

## 2013-07-30 DIAGNOSIS — Z992 Dependence on renal dialysis: Secondary | ICD-10-CM

## 2013-07-30 DIAGNOSIS — J810 Acute pulmonary edema: Secondary | ICD-10-CM

## 2013-07-30 DIAGNOSIS — N186 End stage renal disease: Secondary | ICD-10-CM

## 2013-07-30 LAB — GLUCOSE, CAPILLARY
Glucose-Capillary: 88 mg/dL (ref 70–99)
Glucose-Capillary: 137 mg/dL — ABNORMAL HIGH (ref 70–99)
Glucose-Capillary: 76 mg/dL (ref 70–99)
Glucose-Capillary: 225 mg/dL — ABNORMAL HIGH (ref 70–99)

## 2013-07-30 LAB — URINALYSIS, ROUTINE W REFLEX MICROSCOPIC
Protein, ur: >300 mg/dL — AB
Specific Gravity, Urine: 1.025 (ref 1.005–1.030)
Urobilinogen, UA: 1 mg/dL (ref 0.0–1.0)

## 2013-07-30 LAB — PROTIME-INR: INR: 3.37 — ABNORMAL HIGH (ref 0.00–1.49)

## 2013-07-30 LAB — BASIC METABOLIC PANEL
BUN: 59 mg/dL — ABNORMAL HIGH (ref 6–23)
Chloride: 90 mEq/L — ABNORMAL LOW (ref 96–112)
GFR calc Af Amer: 10 mL/min — ABNORMAL LOW (ref >90)
GFR calc non Af Amer: 8 mL/min — ABNORMAL LOW (ref >90)
Potassium: 3.2 mEq/L — ABNORMAL LOW (ref 3.5–5.1)

## 2013-07-30 LAB — CBC
Hemoglobin: 8.9 g/dL — ABNORMAL LOW (ref 13.0–17.0)
MCHC: 33 g/dL (ref 30.0–36.0)
RDW: 15.6 % — ABNORMAL HIGH (ref 11.5–15.5)
WBC: 9.7 10*3/uL (ref 4.0–10.5)

## 2013-07-30 LAB — TROPONIN I
Troponin I: <0.3 ng/mL (ref <0.30)
Troponin I: <0.3 ng/mL (ref <0.30)

## 2013-07-30 MED ORDER — ACETAMINOPHEN 650 MG RE SUPP: 650.0000 mg | RECTAL | Status: DC | PRN

## 2013-07-30 MED ORDER — DARBEPOETIN ALFA-POLYSORBATE 25 MCG/0.42ML IJ SOLN
25.0000 ug | INTRAMUSCULAR | Status: DC
  Administered 2013-07-30: 25 ug via INTRAVENOUS
  Filled 2013-07-30: qty 0.42

## 2013-07-30 MED ORDER — FINASTERIDE 5 MG PO TABS
5.0000 mg | ORAL_TABLET | Freq: Every evening | ORAL | Status: DC
  Administered 2013-07-30 – 2013-08-02 (×4): 5 mg via ORAL
  Filled 2013-07-30 (×5): qty 1

## 2013-07-30 MED ORDER — RENA-VITE PO TABS
1.0000 | ORAL_TABLET | Freq: Every day | ORAL | Status: DC
  Administered 2013-07-30 – 2013-08-02 (×4): 1 via ORAL
  Filled 2013-07-30 (×4): qty 1

## 2013-07-30 MED ORDER — LIDOCAINE-PRILOCAINE 2.5-2.5 % EX CREA: 1.0000 "application " | TOPICAL_CREAM | CUTANEOUS | Status: DC | PRN

## 2013-07-30 MED ORDER — ACETAMINOPHEN 325 MG PO TABS: 650.0000 mg | ORAL_TABLET | Freq: Four times a day (QID) | ORAL | Status: DC | PRN

## 2013-07-30 MED ORDER — CIPROFLOXACIN HCL 500 MG PO TABS
500.0000 mg | ORAL_TABLET | Freq: Every day | ORAL | Status: DC
  Administered 2013-07-30 – 2013-08-02 (×4): 500 mg via ORAL
  Filled 2013-07-30 (×6): qty 1

## 2013-07-30 MED ORDER — ALTEPLASE 2 MG IJ SOLR
2.0000 mg | Freq: Once | INTRAMUSCULAR | Status: AC | PRN
  Filled 2013-07-30: qty 2

## 2013-07-30 MED ORDER — SODIUM CHLORIDE 0.9 % IV SOLN: 100.0000 mL | INTRAVENOUS | Status: DC | PRN

## 2013-07-30 MED ORDER — HEPARIN SODIUM (PORCINE) 1000 UNIT/ML DIALYSIS
2000.0000 [IU] | Freq: Once | INTRAMUSCULAR | Status: AC
  Administered 2013-07-30: 2000 [IU] via INTRAVENOUS_CENTRAL
  Filled 2013-07-30: qty 2

## 2013-07-30 MED ORDER — NEPRO/CARBSTEADY PO LIQD: 237.0000 mL | ORAL | Status: DC | PRN

## 2013-07-30 MED ORDER — ASPIRIN EC 81 MG PO TBEC
81.0000 mg | DELAYED_RELEASE_TABLET | Freq: Every day | ORAL | Status: DC
  Administered 2013-07-30 – 2013-08-02 (×4): 81 mg via ORAL
  Filled 2013-07-30 (×4): qty 1

## 2013-07-30 MED ORDER — MIDODRINE HCL 5 MG PO TABS
10.0000 mg | ORAL_TABLET | Freq: Two times a day (BID) | ORAL | Status: DC
  Administered 2013-07-30 – 2013-08-02 (×7): 10 mg via ORAL
  Filled 2013-07-30 (×9): qty 2

## 2013-07-30 MED ORDER — CARVEDILOL 6.25 MG PO TABS
6.2500 mg | ORAL_TABLET | Freq: Two times a day (BID) | ORAL | Status: DC
  Administered 2013-07-30 – 2013-07-31 (×3): 6.25 mg via ORAL
  Filled 2013-07-30 (×7): qty 1

## 2013-07-30 MED ORDER — PANTOPRAZOLE SODIUM 40 MG PO TBEC
40.0000 mg | DELAYED_RELEASE_TABLET | Freq: Every day | ORAL | Status: DC
  Administered 2013-07-30 – 2013-08-02 (×3): 40 mg via ORAL
  Filled 2013-07-30 (×3): qty 1

## 2013-07-30 MED ORDER — INSULIN ASPART 100 UNIT/ML ~~LOC~~ SOLN
0.0000 [IU] | Freq: Three times a day (TID) | SUBCUTANEOUS | Status: DC
  Administered 2013-07-31: 2 [IU] via SUBCUTANEOUS
  Administered 2013-07-31: 3 [IU] via SUBCUTANEOUS
  Administered 2013-08-01: 2 [IU] via SUBCUTANEOUS
  Administered 2013-08-01: 3 [IU] via SUBCUTANEOUS
  Administered 2013-08-02 (×3): 5 [IU] via SUBCUTANEOUS

## 2013-07-30 MED ORDER — LIDOCAINE HCL (PF) 1 % IJ SOLN: 5.0000 mL | INTRAMUSCULAR | Status: DC | PRN

## 2013-07-30 MED ORDER — FAMOTIDINE 20 MG PO TABS
20.0000 mg | ORAL_TABLET | Freq: Every day | ORAL | Status: DC
  Administered 2013-07-30 – 2013-08-02 (×4): 20 mg via ORAL
  Filled 2013-07-30 (×4): qty 1

## 2013-07-30 MED ORDER — GABAPENTIN 300 MG PO CAPS
300.0000 mg | ORAL_CAPSULE | Freq: Every morning | ORAL | Status: DC
  Administered 2013-07-30 – 2013-08-02 (×4): 300 mg via ORAL
  Filled 2013-07-30 (×4): qty 1

## 2013-07-30 MED ORDER — HEPARIN SODIUM (PORCINE) 1000 UNIT/ML DIALYSIS
1000.0000 [IU] | INTRAMUSCULAR | Status: DC | PRN
  Filled 2013-07-30: qty 1

## 2013-07-30 MED ORDER — PENTAFLUOROPROP-TETRAFLUOROETH EX AERO: 1.0000 "application " | INHALATION_SPRAY | CUTANEOUS | Status: DC | PRN

## 2013-07-30 MED ORDER — PROMETHAZINE HCL 25 MG/ML IJ SOLN: 12.5000 mg | Freq: Four times a day (QID) | INTRAMUSCULAR | Status: DC | PRN

## 2013-07-30 MED ORDER — LEVOTHYROXINE SODIUM 75 MCG PO TABS
75.0000 ug | ORAL_TABLET | Freq: Every day | ORAL | Status: DC
  Administered 2013-07-30 – 2013-07-31 (×4): 75 ug via ORAL
  Filled 2013-07-30 (×6): qty 1

## 2013-07-30 MED ORDER — IPRATROPIUM-ALBUTEROL 0.5-2.5 (3) MG/3ML IN SOLN: 3.0000 mL | Freq: Four times a day (QID) | RESPIRATORY_TRACT | Status: DC

## 2013-07-30 MED ORDER — IPRATROPIUM BROMIDE 0.02 % IN SOLN
0.5000 mg | Freq: Four times a day (QID) | RESPIRATORY_TRACT | Status: DC
  Administered 2013-07-30 – 2013-08-02 (×13): 0.5 mg via RESPIRATORY_TRACT
  Filled 2013-07-30 (×16): qty 2.5

## 2013-07-30 MED ORDER — CALCIUM ACETATE 667 MG PO CAPS
1334.0000 mg | ORAL_CAPSULE | Freq: Three times a day (TID) | ORAL | Status: DC
  Administered 2013-07-30 – 2013-08-02 (×8): 1334 mg via ORAL
  Filled 2013-07-30 (×13): qty 2

## 2013-07-30 MED ORDER — POTASSIUM CHLORIDE CRYS ER 20 MEQ PO TBCR
40.0000 meq | EXTENDED_RELEASE_TABLET | Freq: Once | ORAL | Status: AC
  Administered 2013-07-30: 40 meq via ORAL
  Filled 2013-07-30: qty 2

## 2013-07-30 MED ORDER — NEPRO/CARBSTEADY PO LIQD
237.0000 mL | Freq: Every day | ORAL | Status: DC
  Administered 2013-08-02: 237 mL via ORAL

## 2013-07-30 MED ORDER — ALBUTEROL SULFATE (5 MG/ML) 0.5% IN NEBU
2.5000 mg | INHALATION_SOLUTION | Freq: Four times a day (QID) | RESPIRATORY_TRACT | Status: DC
  Administered 2013-07-30 – 2013-08-02 (×13): 2.5 mg via RESPIRATORY_TRACT
  Filled 2013-07-30 (×15): qty 0.5

## 2013-07-30 MED ORDER — INSULIN GLARGINE 100 UNIT/ML ~~LOC~~ SOLN
30.0000 [IU] | Freq: Every day | SUBCUTANEOUS | Status: DC
  Administered 2013-07-30 – 2013-08-01 (×4): 30 [IU] via SUBCUTANEOUS
  Filled 2013-07-30 (×7): qty 0.3
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
## 2013-07-30 NOTE — Progress Notes (Signed)
MD notified concerning patient's urine sample: many bacteria, leukocytes, too many WBC's to count, protein, glucose.

## 2013-07-30 NOTE — Procedures (Signed)
I was present at this dialysis session. I have reviewed the session itself and made appropriate changes.   [name] Moselle  MD Pager [phone]    Cell  [phone] 07/30/2013, 4:00 PM
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
## 2013-07-30 NOTE — Progress Notes (Signed)
Patient had several runs of VTach, PVC's, & in A-fib.  Patient asymptomatic.  MD notified.  Will continue to monitor.
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
## 2013-07-30 NOTE — Progress Notes (Addendum)
[facility] PROGRESS NOTE  CON ARGANBRIGHT ZOX:[id number] DOB: 1934-11-13 DOA: 07/29/2013 PCP: [name], MD I have seen and examined pt who is a 79yo admitted this am by Dr Jomarie Longs this am with h/o ESRD on HD, CAD/ischemic cardiomyopathy, AICD, diabetes, paroxysmal A. fib on Coumadin, COPD >> in [place] with pulm edema and pt also found to have UTI. He states he is breathing better this am, denies cp. I have consulted renal for dialysis, will continue current management plan as per Dr Jomarie Longs and follow.   Kela Millin  [facility] Pager [phone]. If 7PM-7AM, please contact night-coverage at [email or web address], password [facility] 07/30/2013, 9:20 AM  LOS: 1 day

## 2013-07-31 DIAGNOSIS — E119 Type 2 diabetes mellitus without complications: Secondary | ICD-10-CM

## 2013-07-31 DIAGNOSIS — I10 Essential (primary) hypertension: Secondary | ICD-10-CM

## 2013-07-31 LAB — BASIC METABOLIC PANEL
Calcium: 9.3 mg/dL (ref 8.4–10.5)
Creatinine, Ser: 3.45 mg/dL — ABNORMAL HIGH (ref 0.50–1.35)
GFR calc Af Amer: 18 mL/min — ABNORMAL LOW (ref >90)
Sodium: 140 mEq/L (ref 135–145)

## 2013-07-31 LAB — GLUCOSE, CAPILLARY
Glucose-Capillary: 90 mg/dL (ref 70–99)
Glucose-Capillary: 183 mg/dL — ABNORMAL HIGH (ref 70–99)

## 2013-07-31 LAB — CBC
Platelets: 224 10*3/uL (ref 150–400)
RBC: 2.79 MIL/uL — ABNORMAL LOW (ref 4.22–5.81)
RDW: 16 % — ABNORMAL HIGH (ref 11.5–15.5)
WBC: 7.8 10*3/uL (ref 4.0–10.5)

## 2013-07-31 MED ORDER — GUAIFENESIN-DM 100-10 MG/5ML PO SYRP
5.0000 mL | ORAL_SOLUTION | ORAL | Status: DC | PRN
  Filled 2013-07-31: qty 5

## 2013-07-31 MED ORDER — BENZONATATE 100 MG PO CAPS
100.0000 mg | ORAL_CAPSULE | Freq: Three times a day (TID) | ORAL | Status: DC
  Administered 2013-07-31 – 2013-08-02 (×6): 100 mg via ORAL
  Filled 2013-07-31 (×9): qty 1

## 2013-07-31 NOTE — Progress Notes (Addendum)
[facility] PROGRESS NOTE  [name] [name] MVH:[id number] DOB: [birth year] DOA: 07/29/2013 PCP: [name], MD  Assessment/Plan: 1. Pulmonary edema  -Clinically improved, reanl dialyzing for volume control -cardiac enzymes neg x2 -Last echo 7/14 with EF of 55-60%  -Etiology unclear patient reports compliance with fluid/salt restriction and medications  2. Probable Catheter associated UTI -abnormal UA,  await culture  -Continue empiric cipro pending urine  culture -likely etiology of nausea/vomiting, now resolved  3. diabetes mellitus  -ok BG control today, continue current Lantus, sliding scale insulin  4. ESRD on HD  -on MWF dialysis, per renal 5. history of ischemic cardiomyopathy  -Status post AICD -he is CP free  6. paroxysmal A. Fib  -Rate controlled, INR elevated Coumadin per pharmacy 7.Suprtherapeutic INR -likely still trending up even with coumadin on hold due to interaction with cipro -continue holding coumadin, follow. Appreciate pharmacy assistance   Code Status: full Family Communication: grand daughter at bedside Disposition Plan: to home possibly in am after diaysis- follow up on pending urine culture    Consultants:  Renal  Procedures:  none  Antibiotics:  Cipro started 8/24  HPI/Subjective: States breathing much better today, decreased cough. Denies CP. No gross bleeding  Objective: Filed Vitals:   07/31/13 1357  BP: 92/53  Pulse: 77  Temp: 98.6 F (37 C)  Resp: 20    Intake/Output Summary (Last 24 hours) at 07/31/13 1657 Last data filed at 07/31/13 1300  Gross per 24 hour  Intake    960 ml  Output   3500 ml  Net  -2540 ml   Filed Weights   07/29/13 2337 07/30/13 1340 07/30/13 2153  Weight: 74.7 kg (164 lb 10.9 oz) 76.6 kg (168 lb 14 oz) 72.938 kg (160 lb 12.8 oz)    Exam:  General: alert & oriented x 3 In NAD Cardiovascular: Regular, nl S1 s2 Respiratory: CTAB Abdomen: soft +BS NT/ND, no masses palpable Extremities: R.  AKA, LLE- No cyanosis and no edema    Data Reviewed: Basic Metabolic Panel:  Recent Labs Lab 07/30/13 0100 07/31/13 0457  NA 133* 140  K 3.2* 4.7  CL 90* 99  CO2 27 31  GLUCOSE 92 114*  BUN 59* 28*  CREATININE 5.84* 3.45*  CALCIUM 9.2 9.3   Liver Function Tests: No results found for this basename: AST, ALT, ALKPHOS, BILITOT, PROT, ALBUMIN,  in the last 168 hours No results found for this basename: LIPASE, AMYLASE,  in the last 168 hours No results found for this basename: AMMONIA,  in the last 168 hours CBC:  Recent Labs Lab 07/30/13 0100 07/31/13 0457  WBC 9.7 7.8  HGB 8.9* 9.4*  HCT 27.0* 29.5*  MCV 102.3* 105.7*  PLT 202 224   Cardiac Enzymes:  Recent Labs Lab 07/30/13 0034 07/30/13 0924  TROPONINI <0.30 <0.30   BNP (last 3 results) No results found for this basename: PROBNP,  in the last 8760 hours CBG:  Recent Labs Lab 07/30/13 1151 07/30/13 1826 07/30/13 2149 07/31/13 0743 07/31/13 1216  GLUCAP 137* 76 225* 90 183*    Recent Results (from the past 240 hour(s))  MRSA PCR SCREENING     Status: None   Collection Time    07/30/13 12:07 AM      Result Value Range Status   MRSA by PCR NEGATIVE  NEGATIVE Final   Comment:            The GeneXpert MRSA Assay (FDA     approved for NASAL specimens  only), is one component of a     comprehensive MRSA colonization     surveillance program. It is not     intended to diagnose MRSA     infection nor to guide or     monitor treatment for     MRSA infections.  URINE CULTURE     Status: None   Collection Time    07/30/13 12:43 AM      Result Value Range Status   Specimen Description URINE, RANDOM   Final   Special Requests ADDED 0222   Final   Culture  Setup Time     Final   Value: 07/30/2013 01:57     Performed at [facility]   Colony Count PENDING   Incomplete   Culture     Final   Value: Culture reincubated for better growth     Performed at [facility]   Report Status  PENDING   Incomplete     Studies: No results found.  Scheduled Meds: . albuterol  2.5 mg Nebulization Q6H   And  . ipratropium  0.5 mg Nebulization Q6H  . antiseptic oral rinse  15 mL Mouth Rinse BID  . aspirin EC  81 mg Oral Daily  . benzonatate  100 mg Oral TID  . calcium acetate  1,334 mg Oral TID WC  . carvedilol  6.25 mg Oral BID WC  . ciprofloxacin  500 mg Oral Q breakfast  . darbepoetin  25 mcg Intravenous Q Mon-HD  . famotidine  20 mg Oral Daily  . feeding supplement (NEPRO CARB STEADY)  237 mL Oral Daily  . finasteride  5 mg Oral QPM  . gabapentin  300 mg Oral q morning - 10a  . insulin aspart  0-15 Units Subcutaneous TID WC  . insulin glargine  30 Units Subcutaneous QHS  . levothyroxine  75 mcg Oral QHS  . midodrine  10 mg Oral BID WC  . multivitamin  1 tablet Oral Daily  . pantoprazole  40 mg Oral Daily   Continuous Infusions:   Active Problems:   ESRD on hemodialysis   DM2 (diabetes mellitus, type 2)   HTN (hypertension)   ICD (implantable cardioverter-defibrillator) in place   CAD (coronary artery disease)   S/P CABG (coronary artery bypass graft)   Atrial fibrillation   Acute pulmonary edema   Warfarin anticoagulation    Time spent:25    Kela Millin  [facility] Pager [phone]. If 7PM-7AM, please contact night-coverage at [email or web address], password [facility] 07/31/2013, 4:57 PM  LOS: 2 days

## 2013-07-31 NOTE — Progress Notes (Signed)
Subjective:  Feeling better/ tolerated hd yest. No sob now Objective Vital signs in last 24 hours: Filed Vitals:   07/31/13 0127 07/31/13 0600 07/31/13 1103 07/31/13 1357  BP:  108/71 91/74 92/53   Pulse:  90 81 77  Temp:  98.3 F (36.8 C) 98 F (36.7 C) 98.6 F (37 C)  TempSrc:  Oral Oral Oral  Resp:  20 20 20   Height:      Weight:      SpO2: 97% 93% 100% 99%   Weight change: 1.9 kg (4 lb 3 oz)  Intake/Output Summary (Last 24 hours) at 07/31/13 1416 Last data filed at 07/31/13 1300  Gross per 24 hour  Intake    960 ml  Output   3500 ml  Net  -2540 ml   Labs: Basic Metabolic Panel:  Recent Labs Lab 07/30/13 0100 07/31/13 0457  NA 133* 140  K 3.2* 4.7  CL 90* 99  CO2 27 31  GLUCOSE 92 114*  BUN 59* 28*  CREATININE 5.84* 3.45*  CALCIUM 9.2 9.3  CBC:  Recent Labs Lab 07/30/13 0100 07/31/13 0457  WBC 9.7 7.8  HGB 8.9* 9.4*  HCT 27.0* 29.5*  MCV 102.3* 105.7*  PLT 202 224   Cardiac Enzymes:  Recent Labs Lab 07/30/13 0034 07/30/13 0924  TROPONINI <0.30 <0.30   CBG:  Recent Labs Lab 07/30/13 1151 07/30/13 1826 07/30/13 2149 07/31/13 0743 07/31/13 1216  GLUCAP 137* 76 225* 90 183*   Studies/Results: No results found. Medications:   . albuterol  2.5 mg Nebulization Q6H   And  . ipratropium  0.5 mg Nebulization Q6H  . antiseptic oral rinse  15 mL Mouth Rinse BID  . aspirin EC  81 mg Oral Daily  . benzonatate  100 mg Oral TID  . calcium acetate  1,334 mg Oral TID WC  . carvedilol  6.25 mg Oral BID WC  . ciprofloxacin  500 mg Oral Q breakfast  . darbepoetin  25 mcg Intravenous Q Mon-HD  . famotidine  20 mg Oral Daily  . feeding supplement (NEPRO CARB STEADY)  237 mL Oral Daily  . finasteride  5 mg Oral QPM  . gabapentin  300 mg Oral q morning - 10a  . insulin aspart  0-15 Units Subcutaneous TID WC  . insulin glargine  30 Units Subcutaneous QHS  . levothyroxine  75 mcg Oral QHS  . midodrine  10 mg Oral BID WC  . multivitamin  1 tablet  Oral Daily  . pantoprazole  40 mg Oral Daily    Physical Exam: General: alert, obese, wm, nad Heart: RRR, soft 1/6 se,m lsb and no rub Lungs: decr. At bases.otherwise CTA Abdomen: obese, soft, nontender/ chronic indwelling foley Cath Extremities: Dialysis Access: Left no pedal edema/ R bka stump no edema/ pos. Bruit R U A AVF   Dialysis Orders: MWF at [place]  72.5 kg 3hr 30 min 2K/2.25Ca+ 2000 Heparin RUA AVG 400/Af 1.5  No Zemplar/ PTH 234 ( 8/06 Epogen 3600 Units IV/HD No Venofer   Assessment/Plan:  1. Pulmonary edema- CE negative/3800 cc HD uf yesterday >4 kg >edw post hd wt 2. UTI- Chronic Indwelling Foley / on Cipro  500 mg po yesterday started/ c/s pending 3. ESRD - MWF at [facility]. K+ 3.2 yesterday - to 4.7 k today , used 4K bath yesterday / 4. Hypertension/volume - BP 92/53, on midodrine./ still slightly above edw with 99% o2 sat. On 2 l [place] o2 5. Anemia - hgb 8.9>9.4 improved with uf.  cont ESA 6. Metabolic bone disease -  phoslo w meals. No vit D, Ca and Phos stable 7. Nutrition - Alb 3.5 (8.6) . Nepro supplements. Multivit.  8. DM- per Admit team.  9. Afib- on coumadin, INR 3.37 and coreg 6.25 BID 10. ICM / ICD / hx CABG- 11. Deconditioned/ Debility= In Assisted Living  Lenny Pastel, PA-C [facility] Bee[facility] [phone] 07/31/2013,2:16 PM  LOS: 2 days   Patient seen and examined.  Agree with assessment and plan as above. Vinson Moselle  MD Pager [phone]    Cell  [phone] 07/31/2013, 4:02 PM

## 2013-07-31 NOTE — Clinical Documentation Improvement (Signed)
THIS DOCUMENT IS NOT A PERMANENT PART OF THE MEDICAL RECORD  Please update your documentation with the medical record to reflect your response to this query. If you need help knowing how to do this please call [phone].                                                                                         07/31/13   Dear Dr. Thedore Mins,  In a better effort to capture your patient's severity of illness, reflect appropriate length of stay and utilization of resources, a review of the patient medical record has revealed the following indicators.    Based on your clinical judgment, please clarify and document in a progress note and/or discharge summary the clinical condition associated with the following supporting information:  In responding to this query please exercise your independent judgment.  The fact that a  query is asked, does not imply that any particular answer is desired or expected.  Possible Clinical Conditions?   -UTI or sepsis due to catheter  -Other Condition  -Cannot Clinically Determine   Supporting Information:   Cipro 500mg  started- f/u culture chronic indwelling foley x 2-3 yrs   You may use possible, probable, or suspect with inpatient documentation. possible, probable, suspected diagnoses MUST be documented at the time of discharge  Reviewed: additional documentation in the medical record  Thank You,  Nevin Bloodgood RN, BSN, CCDS Clinical Documentation Specialist: [phone] Cell=[phone] Health Information Management [place]

## 2013-07-31 NOTE — Progress Notes (Signed)
ANTICOAGULATION CONSULT NOTE  Pharmacy Consult for coumadin Indication: atrial fibrillation  Allergies  Allergen Reactions  . Morphine And Related Other (See Comments)    "makes me hallucinate"  . Penicillins Rash  . Nsaids Other (See Comments)    "don't really know"    Labs:  Recent Labs  07/30/13 0034 07/30/13 0100 07/30/13 0924 07/31/13 0457  HGB  --  8.9*  --  9.4*  HCT  --  27.0*  --  29.5*  PLT  --  202  --  224  LABPROT  --   --  32.9* 38.7*  INR  --   --  3.37* 4.17*  CREATININE  --  5.84*  --  3.45*  TROPONINI <0.30  --  <0.30  --     Estimated Creatinine Clearance: 16.8 ml/min (by C-G formula based on Cr of 3.45).   Assessment: Pharmacy has been requested to dose Mr. Mourer's coumadin for afib.  INR today = 4.17, may continue to be elevated due to Cipro  No bleeding noted  Goal of Therapy:  INR 2-3   Plan:  1. No Coumadin today 2. Daily INR  Thank you. Okey Regal, PharmD [phone]

## 2013-08-01 ENCOUNTER — Inpatient Hospital Stay (HOSPITAL_COMMUNITY): Payer: PRIVATE HEALTH INSURANCE

## 2013-08-01 LAB — BASIC METABOLIC PANEL
BUN: 45 mg/dL — ABNORMAL HIGH (ref 6–23)
CO2: 27 mEq/L (ref 19–32)
Calcium: 9.3 mg/dL (ref 8.4–10.5)
Creatinine, Ser: 4.84 mg/dL — ABNORMAL HIGH (ref 0.50–1.35)
Glucose, Bld: 217 mg/dL — ABNORMAL HIGH (ref 70–99)
Sodium: 132 mEq/L — ABNORMAL LOW (ref 135–145)

## 2013-08-01 LAB — CBC
Hemoglobin: 8.8 g/dL — ABNORMAL LOW (ref 13.0–17.0)
MCV: 105.1 fL — ABNORMAL HIGH (ref 78.0–100.0)
Platelets: 200 10*3/uL (ref 150–400)
RBC: 2.72 MIL/uL — ABNORMAL LOW (ref 4.22–5.81)
WBC: 7.1 10*3/uL (ref 4.0–10.5)

## 2013-08-01 LAB — PROTIME-INR: Prothrombin Time: 40.3 seconds — ABNORMAL HIGH (ref 11.6–15.2)

## 2013-08-01 LAB — GLUCOSE, CAPILLARY: Glucose-Capillary: 152 mg/dL — ABNORMAL HIGH (ref 70–99)

## 2013-08-01 MED ORDER — HEPARIN SODIUM (PORCINE) 1000 UNIT/ML DIALYSIS: 2000.0000 [IU] | Freq: Once | INTRAMUSCULAR | Status: DC

## 2013-08-01 MED ORDER — LIDOCAINE HCL (PF) 1 % IJ SOLN: 5.0000 mL | INTRAMUSCULAR | Status: DC | PRN

## 2013-08-01 MED ORDER — NEPRO/CARBSTEADY PO LIQD: 237.0000 mL | ORAL | Status: DC | PRN

## 2013-08-01 MED ORDER — LIDOCAINE-PRILOCAINE 2.5-2.5 % EX CREA: 1.0000 "application " | TOPICAL_CREAM | CUTANEOUS | Status: DC | PRN

## 2013-08-01 MED ORDER — SODIUM CHLORIDE 0.9 % IV SOLN: 100.0000 mL | INTRAVENOUS | Status: DC | PRN

## 2013-08-01 MED ORDER — ALTEPLASE 2 MG IJ SOLR: 2.0000 mg | Freq: Once | INTRAMUSCULAR | Status: DC | PRN

## 2013-08-01 MED ORDER — HEPARIN SODIUM (PORCINE) 1000 UNIT/ML DIALYSIS: 1000.0000 [IU] | INTRAMUSCULAR | Status: DC | PRN

## 2013-08-01 MED ORDER — CARVEDILOL 3.125 MG PO TABS
3.1250 mg | ORAL_TABLET | Freq: Two times a day (BID) | ORAL | Status: DC
  Administered 2013-08-01 – 2013-08-02 (×3): 3.125 mg via ORAL
  Filled 2013-08-01 (×4): qty 1

## 2013-08-01 MED ORDER — PENTAFLUOROPROP-TETRAFLUOROETH EX AERO: 1.0000 "application " | INHALATION_SPRAY | CUTANEOUS | Status: DC | PRN

## 2013-08-01 NOTE — Progress Notes (Signed)
ANTICOAGULATION CONSULT NOTE  Pharmacy Consult for coumadin Indication: atrial fibrillation  Allergies  Allergen Reactions  . Morphine And Related Other (See Comments)    "makes me hallucinate"  . Penicillins Rash  . Nsaids Other (See Comments)    "don't really know"    Labs:  Recent Labs  07/30/13 0034  07/30/13 0100 07/30/13 0924 07/31/13 0457 08/01/13 1100 08/01/13 1143  HGB  --   < > 8.9*  --  9.4*  --  8.8*  HCT  --   --  27.0*  --  29.5*  --  28.6*  PLT  --   --  202  --  224  --  200  LABPROT  --   --   --  32.9* 38.7* 40.3*  --   INR  --   --   --  3.37* 4.17* 4.40*  --   CREATININE  --   --  5.84*  --  3.45* 4.84*  --   TROPONINI <0.30  --   --  <0.30  --   --   --   < > = values in this interval not displayed.  Estimated Creatinine Clearance: 11.9 ml/min (by C-G formula based on Cr of 4.84).   Assessment: Pharmacy has been requested to dose [name] [name]'s coumadin for afib.  INR today = 4.4, may continue to be elevated due to Cipro  No bleeding noted  Goal of Therapy:  INR 2-3   Plan:  1. No Coumadin today 2. Daily INR  Thank you. Okey Regal, PharmD [phone]

## 2013-08-01 NOTE — Progress Notes (Signed)
Renal Service Daily Progress Note  Subjective: slept better, no sob or cough. CXR this am shows persitent CHF/pulm edema and L base opacity  Physical Exam:  Blood pressure 84/52, pulse 80, temperature 99.6 F (37.6 C), temperature source Oral, resp. rate 18, height 5\' 7"  (1.702 m), weight 72.757 kg (160 lb 6.4 oz), SpO2 95.00%. General: chronically ill appearing older adult male, no distress, alert, weak Heart: irregular rhythm, 2/6 SEM, no rub Lungs: bibasilar crackles L>R Abdomen: obese, soft, nontender/ chronic indwelling foley Cath Extremities: R BKA, no edema of extremities Access: +bruit RUA AVF   Dialysis Orders: MWF at [place]  72.5 kg    3hr 30 min    2K/2.25Ca+     2000 Heparin    RUA AVG   400/Af 1.5  Epogen 3600 Units IV/HD   No Zemplar    No Venofer  PTH 234  Assessment:  1. Pulmonary edema- still wet on xray today, BP's are 80's-90's today, will decrease coreg to 3.125 bid but prob needs this for afib rate control.  Will need more fluid off as BP will tolerate. Patient's overall health is poor and declining, would recommend discussions with family about EOL issues. 2. UTI- Chronic Indwelling Foley / on Cipro. Cx pending 3. ESRD, cont mwf HD 4. Hypotension/vol- on coreg, see above, at dry wt but needs fluid off. Check am cortisol, LVEF wnl.   5. Anemia - hgb 9.4, cont ESA 6. Metabolic bone disease -  phoslo w meals. No vit D, Ca and Phos stable 7. Nutrition - Alb 3.5 (8.6) . Nepro supplements. Multivit.  8. DM- per Admit team.  9. Afib- on coumadin, INR 3.37 and coreg 10. ICM / ICD / hx CABG- 11. Deconditioned/ Debility= In Assisted Living 12. Full code- pt request    Plan- HD today, max UF as BP tolerates, decreased coreg  Vinson Moselle  MD Pager [phone]    Cell  [phone] 08/01/2013, 9:40 AM    Recent Labs Lab 07/30/13 0100 07/31/13 0457  NA 133* 140  K 3.2* 4.7  CL 90* 99  CO2 27 31  GLUCOSE 92 114*  BUN 59* 28*  CREATININE 5.84* 3.45*   CALCIUM 9.2 9.3   No results found for this basename: AST, ALT, ALKPHOS, BILITOT, PROT, ALBUMIN,  in the last 168 hours  Recent Labs Lab 07/30/13 0100 07/31/13 0457  WBC 9.7 7.8  HGB 8.9* 9.4*  HCT 27.0* 29.5*  MCV 102.3* 105.7*  PLT 202 224   . albuterol  2.5 mg Nebulization Q6H   And  . ipratropium  0.5 mg Nebulization Q6H  . antiseptic oral rinse  15 mL Mouth Rinse BID  . aspirin EC  81 mg Oral Daily  . benzonatate  100 mg Oral TID  . calcium acetate  1,334 mg Oral TID WC  . carvedilol  6.25 mg Oral BID WC  . ciprofloxacin  500 mg Oral Q breakfast  . darbepoetin  25 mcg Intravenous Q Mon-HD  . famotidine  20 mg Oral Daily  . feeding supplement (NEPRO CARB STEADY)  237 mL Oral Daily  . finasteride  5 mg Oral QPM  . gabapentin  300 mg Oral q morning - 10a  . insulin aspart  0-15 Units Subcutaneous TID WC  . insulin glargine  30 Units Subcutaneous QHS  . levothyroxine  75 mcg Oral QHS  . midodrine  10 mg Oral BID WC  . multivitamin  1 tablet Oral Daily  . pantoprazole  40  mg Oral Daily     sodium chloride, sodium chloride, acetaminophen, acetaminophen, feeding supplement (NEPRO CARB STEADY), guaiFENesin-dextromethorphan, heparin, lidocaine (PF), lidocaine-prilocaine, pentafluoroprop-tetrafluoroeth, promethazine

## 2013-08-01 NOTE — Progress Notes (Signed)
   CARE MANAGEMENT NOTE 08/01/2013  Patient:  [name] [name], [name] [name]   Account Number:  [id number]  Date Initiated:  08/01/2013  Documentation initiated by:  Jiles Crocker  Subjective/Objective Assessment:   ADMITTED WITH PULMONARY EDEMA, NAUSEA, VOMITING, UTI     Action/Plan:   PATIENT RESIDES IN A NURSING FACILITY; SOC WORKER REFERRAL PLACED   Anticipated DC Date:  08/01/2013   Anticipated DC Plan:  SKILLED NURSING FACILITY  In-house referral  Clinical Social Worker      DC Planning Services  CM consult          Status of service:  In process, will continue to follow Medicare Important Message given?  NA - LOS <3 / Initial given by admissions (If response is "NO", the following Medicare IM given date fields will be blank)  Per UR Regulation:  Reviewed for med. necessity/level of care/duration of stay  Comments:  08/01/2013- B Master Touchet RN,BSN,MHA

## 2013-08-01 NOTE — Progress Notes (Signed)
[facility]                                                                                Patient Demographics  [name] [name], is a 79 y.o. male, DOB - 1934/02/08, ZOX:[id number], WJX:[id number]  Admit date - 07/29/2013  Admitting Physician Eduard Clos, MD  Outpatient Primary MD for the patient is SOPALA,JERZY, MD  LOS - 3   No chief complaint on file.       Assessment & Plan    1. Pulmonary edema   -Clinically improved, reanl dialyzing for volume control , continue dialysis as tolerated by BP, renal following -cardiac enzymes neg x2  -Last echo 7/14 with EF of 55-60%  -? compliance with fluid/salt restriction and medications     2. Probable Catheter associated UTI  -abnormal UA, await culture  -Continue empiric cipro pending urine culture  -likely etiology of nausea/vomiting, now resolved     3. diabetes mellitus  -ok BG control today, continue current Lantus, sliding scale insulin   CBG (last 3)   Recent Labs  07/31/13 2130 08/01/13 0643 08/01/13 0741  GLUCAP 153* 139* 152*      4. ESRD on HD  -on MWF dialysis, per renal      5. history of ischemic cardiomyopathy ,Status post AICD, he is improved on new echo -he is CP free, patient follow with cardiology post discharge  Pressure too low so Coreg dose has been reduced, no case/ARB due to low blood pressure. The new home dose aspirin.     6. paroxysmal A. Fib  -Rate controlled, INR elevated Coumadin per pharmacy, Coreg as tolerated by blood pressure, if it becomes an issue he will be placed on amiodarone versus dig oxygen.    7.Suprtherapeutic INR  Coumadin followed by pharmacy    8. Persistant hypotension. This has been a chronic issue, Coreg dose has been reduced, he has been placed on max dose midodrine, we will monitor. In the presence of pulmonary edema this is a bad prognostic sign as it limits dialysis and fluid removal.     Code Status: Full  Family  Communication: none present  Disposition Plan: TBD   Procedures     Consults  Renal   DVT Prophylaxis  Coumadin  Lab Results  Component Value Date   INR 4.40* 08/01/2013   INR 4.17* 07/31/2013   INR 3.37* 07/30/2013     Lab Results  Component Value Date   PLT 200 08/01/2013    Medications  Scheduled Meds: . albuterol  2.5 mg Nebulization Q6H   And  . ipratropium  0.5 mg Nebulization Q6H  . antiseptic oral rinse  15 mL Mouth Rinse BID  . aspirin EC  81 mg Oral Daily  . benzonatate  100 mg Oral TID  . calcium acetate  1,334 mg Oral TID WC  . carvedilol  3.125 mg Oral BID WC  . ciprofloxacin  500 mg Oral Q breakfast  . darbepoetin  25 mcg Intravenous Q Mon-HD  . famotidine  20 mg Oral Daily  . feeding supplement (NEPRO CARB STEADY)  237 mL Oral Daily  . finasteride  5 mg Oral QPM  . gabapentin  300 mg Oral q morning - 10a  . heparin  2,000 Units Dialysis Once in dialysis  . insulin aspart  0-15 Units Subcutaneous TID WC  . insulin glargine  30 Units Subcutaneous QHS  . levothyroxine  75 mcg Oral QHS  . midodrine  10 mg Oral BID WC  . multivitamin  1 tablet Oral Daily  . pantoprazole  40 mg Oral Daily   Continuous Infusions:  PRN Meds:.sodium chloride, sodium chloride, sodium chloride, sodium chloride, acetaminophen, acetaminophen, alteplase, feeding supplement (NEPRO CARB STEADY), feeding supplement (NEPRO CARB STEADY), guaiFENesin-dextromethorphan, heparin, heparin, lidocaine (PF), lidocaine (PF), lidocaine-prilocaine, lidocaine-prilocaine, pentafluoroprop-tetrafluoroeth, pentafluoroprop-tetrafluoroeth, promethazine  Antibiotics     Anti-infectives   Start     Dose/Rate Route Frequency Ordered Stop   07/30/13 0715  ciprofloxacin (CIPRO) tablet 500 mg     500 mg Oral Daily with breakfast 07/30/13 0865         Time Spent in minutes   40   SINGH,PRASHANT K M.D on 08/01/2013 at 1:22 PM  Between 7am to 7pm - Pager - [phone]  After 7pm go to  [email or web address] - password [email or web address]  And look for the night coverage person covering for me after hours  [facility] Office  [phone]    Subjective:   [name] [name] today has, No headache, No chest pain, No abdominal pain - No Nausea, No new weakness tingling or numbness, No Cough - SOB.    Objective:   Filed Vitals:   08/01/13 1146 08/01/13 1150 08/01/13 1208 08/01/13 1241  BP: 99/49 107/53 102/44 81/40  Pulse: 79 89 92 84  Temp:      TempSrc:      Resp:      Height:      Weight:      SpO2:  100%  100%    Wt Readings from Last 3 Encounters:  08/01/13 73.8 kg (162 lb 11.2 oz)  07/05/13 72.2 kg (159 lb 2.8 oz)  06/01/12 71.668 kg (158 lb)     Intake/Output Summary (Last 24 hours) at 08/01/13 1322 Last data filed at 08/01/13 0900  Gross per 24 hour  Intake    360 ml  Output      0 ml  Net    360 ml    Exam Awake Alert, Oriented X 3, No new F.N deficits, Normal affect [place].AT,PERRAL Supple Neck,No JVD, No cervical lymphadenopathy appriciated.  Symmetrical Chest wall movement, Good air movement bilaterally, CTAB RRR,No Gallops,Rubs or new Murmurs, No Parasternal Heave +ve B.Sounds, Abd Soft, Non tender, No organomegaly appriciated, No rebound - guarding or rigidity. No Cyanosis, Clubbing or edema, No new Rash or bruise  , R AKA   Data Review   Micro Results Recent Results (from the past 240 hour(s))  MRSA PCR SCREENING     Status: None   Collection Time    07/30/13 12:07 AM      Result Value Range Status   MRSA by PCR NEGATIVE  NEGATIVE Final   Comment:            The GeneXpert MRSA Assay (FDA     approved for NASAL specimens     only), is one component of a     comprehensive MRSA colonization     surveillance program. It is not     intended to diagnose MRSA     infection nor to guide or     monitor treatment for     MRSA infections.  URINE CULTURE  Status: None   Collection Time    07/30/13 12:43 AM      Result Value Range Status    Specimen Description URINE, RANDOM   Final   Special Requests ADDED 0222   Final   Culture  Setup Time     Final   Value: 07/30/2013 01:57     Performed at [facility]   Colony Count     Final   Value: >=100,000 COLONIES/ML     Performed at [facility]   Culture     Final   Value: ESCHERICHIA COLI     PROTEUS MIRABILIS     Performed at [facility]   Report Status PENDING   Incomplete    Radiology Reports Dg Chest [place] 1 View  08/01/2013   *RADIOLOGY REPORT*  Clinical Data: Follow up of pulmonary edema.  PORTABLE CHEST - 1 VIEW  Comparison: 07/29/2013  Findings: Patient rotated to the left.  Pacer / AICD device with leads right atrium right ventricle. Prior median sternotomy. Cardiomegaly accentuated by AP portable technique.  No pneumothorax.  Small left and trace right pleural effusions are similar.  Mild to moderate interstitial edema is not significantly changed.  Lower lobe predominant airspace opacities are greater on the left than right and similar.  IMPRESSION: No significant change since the prior exam.  Congestive heart failure with bilateral pleural effusions and lower lobe predominant airspace disease.  This could represent atelectasis or concurrent infection.  Suboptimal patient positioning.   Original Report Authenticated By: Jeronimo Greaves, M.D.   Dg Chest [place] 1 View  07/04/2013   *RADIOLOGY REPORT*  Clinical Data: Cough.  Congestion.  Infiltrates.  PORTABLE CHEST - 1 VIEW  Comparison: 07/03/2013.  Findings: The patient is again rotated to the left.  Left subclavian AICD appears similar.  Pulmonary aeration appears little changed compared to prior exam.  Aortic arch atherosclerosis. Cardiopericardial silhouette appears similar. Left pleural effusion and basilar atelectasis. No pneumothorax.  IMPRESSION: No interval change.   Original Report Authenticated By: Andreas Newport, M.D.   Dg Chest Port 1 View  07/03/2013   *RADIOLOGY REPORT*  Clinical Data: Cough  and shortness of breath.  PORTABLE CHEST - 1 VIEW  Comparison: 07/01/2013.  Findings: Dual chamber left subclavian approach ICD / pacer appears similar to prior.  Cardiomegaly, status post CABG.  Mediastinal contours distorted by leftward rotation.  Decreasing pleural effusions, with less fissural fluid on the right.  Streaky lower lung opacities likely represent atelectasis in the setting of low lung volumes.  Negative for edema or pneumothorax.  Upper lumbar spine posterior fixation.  IMPRESSION:  1.  Decreasing pleural effusions. 2.  Basilar opacities favored represent atelectasis.   Original Report Authenticated By: Tiburcio Pea    CBC  Recent Labs Lab 07/30/13 0100 07/31/13 0457 08/01/13 1143  WBC 9.7 7.8 7.1  HGB 8.9* 9.4* 8.8*  HCT 27.0* 29.5* 28.6*  PLT 202 224 200  MCV 102.3* 105.7* 105.1*  MCH 33.7 33.7 32.4  MCHC 33.0 31.9 30.8  RDW 15.6* 16.0* 15.8*    Chemistries   Recent Labs Lab 07/30/13 0100 07/31/13 0457 08/01/13 1100  NA 133* 140 132*  K 3.2* 4.7 4.7  CL 90* 99 93*  CO2 27 31 27   GLUCOSE 92 114* 217*  BUN 59* 28* 45*  CREATININE 5.84* 3.45* 4.84*  CALCIUM 9.2 9.3 9.3   ------------------------------------------------------------------------------------------------------------------ estimated creatinine clearance is 11.9 ml/min (by C-G formula based on Cr of 4.84). ------------------------------------------------------------------------------------------------------------------ No results found  for this basename: HGBA1C,  in the last 72 hours ------------------------------------------------------------------------------------------------------------------ No results found for this basename: CHOL, HDL, LDLCALC, TRIG, CHOLHDL, LDLDIRECT,  in the last 72 hours ------------------------------------------------------------------------------------------------------------------ No results found for this basename: TSH, T4TOTAL, FREET3, T3FREE, THYROIDAB,  in the  last 72 hours ------------------------------------------------------------------------------------------------------------------ No results found for this basename: VITAMINB12, FOLATE, FERRITIN, TIBC, IRON, RETICCTPCT,  in the last 72 hours  Coagulation profile  Recent Labs Lab 07/30/13 0924 07/31/13 0457 08/01/13 1100  INR 3.37* 4.17* 4.40*    No results found for this basename: DDIMER,  in the last 72 hours  Cardiac Enzymes  Recent Labs Lab 07/30/13 0034 07/30/13 0924  TROPONINI <0.30 <0.30   ------------------------------------------------------------------------------------------------------------------ No components found with this basename: POCBNP,

## 2013-08-02 ENCOUNTER — Inpatient Hospital Stay (HOSPITAL_COMMUNITY): Payer: PRIVATE HEALTH INSURANCE

## 2013-08-02 LAB — GLUCOSE, CAPILLARY

## 2013-08-02 LAB — PROTIME-INR: Prothrombin Time: 25.6 seconds — ABNORMAL HIGH (ref 11.6–15.2)

## 2013-08-02 MED ORDER — NEPRO/CARBSTEADY PO LIQD: 237.0000 mL | ORAL | Status: DC | PRN

## 2013-08-02 MED ORDER — SODIUM CHLORIDE 0.9 % IV SOLN: 100.0000 mL | INTRAVENOUS | Status: DC | PRN

## 2013-08-02 MED ORDER — FOSFOMYCIN TROMETHAMINE 3 G PO PACK
3.0000 g | PACK | Freq: Once | ORAL | Status: DC
  Filled 2013-08-02: qty 3

## 2013-08-02 MED ORDER — SODIUM CHLORIDE 0.9 % IV SOLN
500.0000 mg | INTRAVENOUS | Status: DC
  Administered 2013-08-02: 0.5 g via INTRAVENOUS
  Filled 2013-08-02: qty 0.5

## 2013-08-02 MED ORDER — LIDOCAINE-PRILOCAINE 2.5-2.5 % EX CREA: 1.0000 "application " | TOPICAL_CREAM | CUTANEOUS | Status: DC | PRN

## 2013-08-02 MED ORDER — WARFARIN SODIUM 2 MG PO TABS
2.0000 mg | ORAL_TABLET | Freq: Once | ORAL | Status: AC
  Administered 2013-08-02: 2 mg via ORAL
  Filled 2013-08-02: qty 1

## 2013-08-02 MED ORDER — HEPARIN SODIUM (PORCINE) 1000 UNIT/ML DIALYSIS: 2000.0000 [IU] | Freq: Once | INTRAMUSCULAR | Status: DC

## 2013-08-02 MED ORDER — MIDODRINE HCL 5 MG PO TABS
10.0000 mg | ORAL_TABLET | Freq: Three times a day (TID) | ORAL | Status: DC
  Administered 2013-08-02 (×2): 10 mg via ORAL
  Filled 2013-08-02 (×3): qty 2

## 2013-08-02 MED ORDER — PENTAFLUOROPROP-TETRAFLUOROETH EX AERO: 1.0000 "application " | INHALATION_SPRAY | CUTANEOUS | Status: DC | PRN

## 2013-08-02 MED ORDER — ALTEPLASE 2 MG IJ SOLR
2.0000 mg | Freq: Once | INTRAMUSCULAR | Status: DC | PRN
  Filled 2013-08-02: qty 2

## 2013-08-02 MED ORDER — CARVEDILOL 3.125 MG PO TABS: 3.1250 mg | ORAL_TABLET | Freq: Two times a day (BID) | ORAL | Status: AC

## 2013-08-02 MED ORDER — WARFARIN - PHARMACIST DOSING INPATIENT: Freq: Every day | Status: DC

## 2013-08-02 MED ORDER — LIDOCAINE HCL (PF) 1 % IJ SOLN: 5.0000 mL | INTRAMUSCULAR | Status: DC | PRN

## 2013-08-02 MED ORDER — HEPARIN SODIUM (PORCINE) 1000 UNIT/ML DIALYSIS
1000.0000 [IU] | INTRAMUSCULAR | Status: DC | PRN
  Filled 2013-08-02: qty 1

## 2013-08-02 MED ORDER — MIDODRINE HCL 10 MG PO TABS: 10.0000 mg | ORAL_TABLET | Freq: Three times a day (TID) | ORAL | Status: DC

## 2013-08-02 NOTE — Progress Notes (Signed)
Renal Service Daily Progress Note  Subjective: no sob , cxr shows improved CHF today  Physical Exam:  Blood pressure 99/72, pulse 77, temperature 98.5 F (36.9 C), temperature source Oral, resp. rate 20, height 5\' 7"  (1.702 m), weight 70.2 kg (154 lb 12.2 oz), SpO2 100.00%. General: chronically ill appearing older adult male, no distress, alert, weak Heart: irregular rhythm, 2/6 SEM, no rub Lungs: bibasilar crackles L>R Abdomen: obese, soft, nontender/ chronic indwelling foley Cath Extremities: R BKA, no edema of extremities Access: +bruit RUA AVF   Dialysis Orders: MWF at [place]  72.5 kg    3hr 30 min    2K/2.25Ca+     2000 Heparin    RUA AVG   400/Af 1.5  Epogen 3600 Units IV/HD   No Zemplar    No Venofer  PTH 234  Assessment/Plan:  1. Pulmonary edema / chronic hypotension- improved by xray today; dry wt down to 70kg today but we should try to get it lower again w HD tomorrow if BP will tolerate. ^'d midodrine to max dose and dec'd coreg to 3.125 bid for afib 2. UTI- indwelling foley, ESBL EColi > per primary 3. Chronic hypotension- LV normal, cortisol pending, see above 4. Afib- on coumadin and coreg 5. ESRD, cont mwf HD 6. Anemia - hgb 9.4, cont ESA 7. MBD- cont phoslo 8. DM2- insulin 9. ICM, hx of ICD/CABG 11. Chronic debility- in ALF x 3 yrs 12. EOL- pt desires full code, wants everything done   [name] Moselle  MD Pager [phone]    Cell  [phone] 08/02/2013, 9:42 AM    Recent Labs Lab 07/30/13 0100 07/31/13 0457 08/01/13 1100  NA 133* 140 132*  K 3.2* 4.7 4.7  CL 90* 99 93*  CO2 27 31 27   GLUCOSE 92 114* 217*  BUN 59* 28* 45*  CREATININE 5.84* 3.45* 4.84*  CALCIUM 9.2 9.3 9.3   No results found for this basename: AST, ALT, ALKPHOS, BILITOT, PROT, ALBUMIN,  in the last 168 hours  Recent Labs Lab 07/30/13 0100 07/31/13 0457 08/01/13 1143  WBC 9.7 7.8 7.1  HGB 8.9* 9.4* 8.8*  HCT 27.0* 29.5* 28.6*  MCV 102.3* 105.7* 105.1*  PLT 202 224 200   .  albuterol  2.5 mg Nebulization Q6H   And  . ipratropium  0.5 mg Nebulization Q6H  . antiseptic oral rinse  15 mL Mouth Rinse BID  . aspirin EC  81 mg Oral Daily  . benzonatate  100 mg Oral TID  . calcium acetate  1,334 mg Oral TID WC  . carvedilol  3.125 mg Oral BID WC  . darbepoetin  25 mcg Intravenous Q Mon-HD  . ertapenem  500 mg Intravenous Q24H  . famotidine  20 mg Oral Daily  . feeding supplement (NEPRO CARB STEADY)  237 mL Oral Daily  . finasteride  5 mg Oral QPM  . gabapentin  300 mg Oral q morning - 10a  . insulin aspart  0-15 Units Subcutaneous TID WC  . insulin glargine  30 Units Subcutaneous QHS  . levothyroxine  75 mcg Oral QHS  . midodrine  10 mg Oral BID WC  . multivitamin  1 tablet Oral Daily  . pantoprazole  40 mg Oral Daily  . warfarin  2 mg Oral ONCE-1800  . Warfarin - Pharmacist Dosing Inpatient   Does not apply q1800     acetaminophen, acetaminophen, guaiFENesin-dextromethorphan, promethazine
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
## 2013-08-02 NOTE — Progress Notes (Signed)
8/28  Increase Lantus to 35 units daily if CBGs continue greater than 180 mg/dl.   Smith Mince RN BSN CDE

## 2013-08-02 NOTE — Clinical Social Work Psychosocial (Addendum)
Clinical Social Work Department BRIEF PSYCHOSOCIAL ASSESSMENT 08/02/2013  Patient:  [name] [name], [name] [name]     Account Number:  [id number]     Admit date:  07/29/2013  Clinical Social Worker:  Delmer Islam  Date/Time:  08/02/2013 03:42 PM  Referred by:  Physician  Date Referred:  08/02/2013 Referred for  SNF Placement   Other Referral:   Interview type:  Patient Other interview type:    PSYCHOSOCIAL DATA Living Status:  FACILITY Admitted from facility:  [facility] Level of care:  Skilled Nursing Facility Primary support name:  Aris Everts Primary support relationship to patient:  CHILD, ADULT Degree of support available:   patient also has a wife and another daughter who are concerned and involved.    CURRENT CONCERNS Current Concerns  Post-Acute Placement   Other Concerns:    SOCIAL WORK ASSESSMENT / PLAN CSW talked with patient to confirm return to [facility] today. When asked,  patient reported that his wife is aware of discharge. Patient is aware that he will return to facility via ambulance.   Assessment/plan status:  No Further Intervention Required Other assessment/ plan:   Information/referral to community resources:    PATIENT'S/FAMILY'S RESPONSE TO PLAN OF CARE: Patient appreciative of CSW assistance. DISCHARGE NOTE: Patient discharging back to [facility] today. Medical packet prepared and will accompany patient via ambulance.

## 2013-08-02 NOTE — Progress Notes (Signed)
ANTICOAGULATION CONSULT NOTE  Pharmacy Consult for coumadin Indication: atrial fibrillation  Allergies  Allergen Reactions  . Morphine And Related Other (See Comments)    "makes me hallucinate"  . Penicillins Rash  . Nsaids Other (See Comments)    "don't really know"    Labs:  Recent Labs  07/30/13 0924 07/31/13 0457 08/01/13 1100 08/01/13 1143 08/02/13 0607  HGB  --  9.4*  --  8.8*  --   HCT  --  29.5*  --  28.6*  --   PLT  --  224  --  200  --   LABPROT 32.9* 38.7* 40.3*  --  25.6*  INR 3.37* 4.17* 4.40*  --  2.43*  CREATININE  --  3.45* 4.84*  --   --   TROPONINI <0.30  --   --   --   --     Estimated Creatinine Clearance: 11.9 ml/min (by C-G formula based on Cr of 4.84).   Assessment: Pharmacy has been requested to dose [name] [name]'s coumadin for afib.  INR today = 2.43 (trended down -- may see further decrease)  No bleeding noted  Goal of Therapy:  INR 2-3   Plan:  1. Coumadin 2 mg po x 1 today 2. Daily INR  Thank you. Okey Regal, PharmD [phone]

## 2013-08-03 LAB — URINE CULTURE: Colony Count: >=100000

## 2013-08-06 ENCOUNTER — Inpatient Hospital Stay (HOSPITAL_COMMUNITY): Payer: PRIVATE HEALTH INSURANCE

## 2013-08-06 ENCOUNTER — Encounter (HOSPITAL_COMMUNITY): Payer: Self-pay | Admitting: General Practice

## 2013-08-06 ENCOUNTER — Inpatient Hospital Stay (HOSPITAL_COMMUNITY)
Admission: AD | Admit: 2013-08-06 | Discharge: 2013-08-11 | DRG: 166 | Disposition: A | Discharge status: Skilled Nursing Facility | Payer: PRIVATE HEALTH INSURANCE | Source: Other Acute Inpatient Hospital | Attending: Family Medicine | Admitting: Family Medicine

## 2013-08-06 DIAGNOSIS — Z981 Arthrodesis status: Secondary | ICD-10-CM

## 2013-08-06 DIAGNOSIS — Z992 Dependence on renal dialysis: Secondary | ICD-10-CM

## 2013-08-06 DIAGNOSIS — Z8546 Personal history of malignant neoplasm of prostate: Secondary | ICD-10-CM

## 2013-08-06 DIAGNOSIS — S78119A Complete traumatic amputation at level between unspecified hip and knee, initial encounter: Secondary | ICD-10-CM

## 2013-08-06 DIAGNOSIS — I2589 Other forms of chronic ischemic heart disease: Secondary | ICD-10-CM | POA: Diagnosis present

## 2013-08-06 DIAGNOSIS — Z95810 Presence of automatic (implantable) cardiac defibrillator: Secondary | ICD-10-CM

## 2013-08-06 DIAGNOSIS — Z87891 Personal history of nicotine dependence: Secondary | ICD-10-CM

## 2013-08-06 DIAGNOSIS — Z66 Do not resuscitate: Secondary | ICD-10-CM | POA: Diagnosis present

## 2013-08-06 DIAGNOSIS — D72829 Elevated white blood cell count, unspecified: Secondary | ICD-10-CM | POA: Diagnosis present

## 2013-08-06 DIAGNOSIS — I120 Hypertensive chronic kidney disease with stage 5 chronic kidney disease or end stage renal disease: Secondary | ICD-10-CM | POA: Diagnosis present

## 2013-08-06 DIAGNOSIS — D631 Anemia in chronic kidney disease: Secondary | ICD-10-CM | POA: Diagnosis present

## 2013-08-06 DIAGNOSIS — Z880 Allergy status to penicillin: Secondary | ICD-10-CM

## 2013-08-06 DIAGNOSIS — E1142 Type 2 diabetes mellitus with diabetic polyneuropathy: Secondary | ICD-10-CM | POA: Diagnosis present

## 2013-08-06 DIAGNOSIS — Z961 Presence of intraocular lens: Secondary | ICD-10-CM

## 2013-08-06 DIAGNOSIS — I10 Essential (primary) hypertension: Secondary | ICD-10-CM

## 2013-08-06 DIAGNOSIS — Y832 Surgical operation with anastomosis, bypass or graft as the cause of abnormal reaction of the patient, or of later complication, without mention of misadventure at the time of the procedure: Secondary | ICD-10-CM | POA: Diagnosis not present

## 2013-08-06 DIAGNOSIS — Z841 Family history of disorders of kidney and ureter: Secondary | ICD-10-CM

## 2013-08-06 DIAGNOSIS — Z888 Allergy status to other drugs, medicaments and biological substances status: Secondary | ICD-10-CM

## 2013-08-06 DIAGNOSIS — J9601 Acute respiratory failure with hypoxia: Secondary | ICD-10-CM

## 2013-08-06 DIAGNOSIS — Z7901 Long term (current) use of anticoagulants: Secondary | ICD-10-CM

## 2013-08-06 DIAGNOSIS — N186 End stage renal disease: Secondary | ICD-10-CM

## 2013-08-06 DIAGNOSIS — R0689 Other abnormalities of breathing: Secondary | ICD-10-CM

## 2013-08-06 DIAGNOSIS — T82898A Other specified complication of vascular prosthetic devices, implants and grafts, initial encounter: Secondary | ICD-10-CM | POA: Diagnosis not present

## 2013-08-06 DIAGNOSIS — J449 Chronic obstructive pulmonary disease, unspecified: Secondary | ICD-10-CM | POA: Diagnosis present

## 2013-08-06 DIAGNOSIS — I2510 Atherosclerotic heart disease of native coronary artery without angina pectoris: Secondary | ICD-10-CM | POA: Diagnosis present

## 2013-08-06 DIAGNOSIS — Z9861 Coronary angioplasty status: Secondary | ICD-10-CM

## 2013-08-06 DIAGNOSIS — I9589 Other hypotension: Secondary | ICD-10-CM | POA: Diagnosis present

## 2013-08-06 DIAGNOSIS — Z89619 Acquired absence of unspecified leg above knee: Secondary | ICD-10-CM

## 2013-08-06 DIAGNOSIS — I4891 Unspecified atrial fibrillation: Secondary | ICD-10-CM

## 2013-08-06 DIAGNOSIS — I252 Old myocardial infarction: Secondary | ICD-10-CM

## 2013-08-06 DIAGNOSIS — R791 Abnormal coagulation profile: Secondary | ICD-10-CM | POA: Diagnosis present

## 2013-08-06 DIAGNOSIS — Z833 Family history of diabetes mellitus: Secondary | ICD-10-CM

## 2013-08-06 DIAGNOSIS — E1149 Type 2 diabetes mellitus with other diabetic neurological complication: Secondary | ICD-10-CM | POA: Diagnosis present

## 2013-08-06 DIAGNOSIS — Z79899 Other long term (current) drug therapy: Secondary | ICD-10-CM

## 2013-08-06 DIAGNOSIS — Z85820 Personal history of malignant melanoma of skin: Secondary | ICD-10-CM

## 2013-08-06 DIAGNOSIS — Z886 Allergy status to analgesic agent status: Secondary | ICD-10-CM

## 2013-08-06 DIAGNOSIS — I739 Peripheral vascular disease, unspecified: Secondary | ICD-10-CM | POA: Diagnosis present

## 2013-08-06 DIAGNOSIS — E785 Hyperlipidemia, unspecified: Secondary | ICD-10-CM | POA: Diagnosis present

## 2013-08-06 DIAGNOSIS — J4489 Other specified chronic obstructive pulmonary disease: Secondary | ICD-10-CM | POA: Diagnosis present

## 2013-08-06 DIAGNOSIS — Z89612 Acquired absence of left leg above knee: Secondary | ICD-10-CM

## 2013-08-06 DIAGNOSIS — J189 Pneumonia, unspecified organism: Principal | ICD-10-CM | POA: Diagnosis present

## 2013-08-06 DIAGNOSIS — J810 Acute pulmonary edema: Secondary | ICD-10-CM

## 2013-08-06 DIAGNOSIS — E860 Dehydration: Secondary | ICD-10-CM | POA: Diagnosis present

## 2013-08-06 DIAGNOSIS — R4182 Altered mental status, unspecified: Secondary | ICD-10-CM | POA: Diagnosis present

## 2013-08-06 DIAGNOSIS — G2581 Restless legs syndrome: Secondary | ICD-10-CM | POA: Diagnosis present

## 2013-08-06 DIAGNOSIS — Z794 Long term (current) use of insulin: Secondary | ICD-10-CM

## 2013-08-06 DIAGNOSIS — I214 Non-ST elevation (NSTEMI) myocardial infarction: Secondary | ICD-10-CM

## 2013-08-06 DIAGNOSIS — Z7982 Long term (current) use of aspirin: Secondary | ICD-10-CM

## 2013-08-06 DIAGNOSIS — E119 Type 2 diabetes mellitus without complications: Secondary | ICD-10-CM

## 2013-08-06 DIAGNOSIS — N400 Enlarged prostate without lower urinary tract symptoms: Secondary | ICD-10-CM | POA: Diagnosis present

## 2013-08-06 DIAGNOSIS — N2581 Secondary hyperparathyroidism of renal origin: Secondary | ICD-10-CM | POA: Diagnosis present

## 2013-08-06 DIAGNOSIS — Z951 Presence of aortocoronary bypass graft: Secondary | ICD-10-CM

## 2013-08-06 DIAGNOSIS — Z9849 Cataract extraction status, unspecified eye: Secondary | ICD-10-CM

## 2013-08-06 HISTORY — DX: Paroxysmal atrial fibrillation: I48.0

## 2013-08-06 HISTORY — DX: Presence of automatic (implantable) cardiac defibrillator: Z95.810

## 2013-08-06 HISTORY — DX: Personal history of other medical treatment: Z92.89

## 2013-08-06 LAB — COMPREHENSIVE METABOLIC PANEL
ALT: 12 U/L (ref 0–53)
AST: 11 U/L (ref 0–37)
Alkaline Phosphatase: 83 U/L (ref 39–117)
CO2: 25 mEq/L (ref 19–32)
Calcium: 9.1 mg/dL (ref 8.4–10.5)
GFR calc non Af Amer: 7 mL/min — ABNORMAL LOW (ref >90)
Potassium: 3.8 mEq/L (ref 3.5–5.1)
Sodium: 137 mEq/L (ref 135–145)
Total Protein: 6.2 g/dL (ref 6.0–8.3)

## 2013-08-06 LAB — CBC WITH DIFFERENTIAL/PLATELET
Eosinophils Absolute: 0 10*3/uL (ref 0.0–0.7)
Eosinophils Relative: 0 % (ref 0–5)
HCT: 28.9 % — ABNORMAL LOW (ref 39.0–52.0)
Lymphocytes Relative: 4 % — ABNORMAL LOW (ref 12–46)
Lymphs Abs: 0.7 10*3/uL (ref 0.7–4.0)
MCH: 32.5 pg (ref 26.0–34.0)
MCV: 103.2 fL — ABNORMAL HIGH (ref 78.0–100.0)
Monocytes Absolute: 0.8 10*3/uL (ref 0.1–1.0)
Monocytes Relative: 4 % (ref 3–12)
RBC: 2.8 MIL/uL — ABNORMAL LOW (ref 4.22–5.81)
WBC: 17.3 10*3/uL — ABNORMAL HIGH (ref 4.0–10.5)

## 2013-08-06 LAB — GLUCOSE, CAPILLARY: Glucose-Capillary: 213 mg/dL — ABNORMAL HIGH (ref 70–99)

## 2013-08-06 LAB — LIPID PANEL
HDL: 24 mg/dL — ABNORMAL LOW (ref >39)
LDL Cholesterol: 71 mg/dL (ref 0–99)
VLDL: 25 mg/dL (ref 0–40)

## 2013-08-06 LAB — HEMOGLOBIN A1C: Mean Plasma Glucose: 169 mg/dL — ABNORMAL HIGH (ref <117)

## 2013-08-06 LAB — PROCALCITONIN: Procalcitonin: 2.49 ng/mL

## 2013-08-06 MED ORDER — ASPIRIN 81 MG PO CHEW: 324.0000 mg | CHEWABLE_TABLET | ORAL | Status: AC

## 2013-08-06 MED ORDER — IPRATROPIUM-ALBUTEROL 0.5-2.5 (3) MG/3ML IN SOLN: 3.0000 mL | Freq: Four times a day (QID) | RESPIRATORY_TRACT | Status: DC

## 2013-08-06 MED ORDER — INSULIN ASPART 100 UNIT/ML ~~LOC~~ SOLN
0.0000 [IU] | SUBCUTANEOUS | Status: DC
  Administered 2013-08-06 (×2): 2 [IU] via SUBCUTANEOUS
  Administered 2013-08-07: 3 [IU] via SUBCUTANEOUS
  Administered 2013-08-07: 5 [IU] via SUBCUTANEOUS
  Administered 2013-08-07 (×2): 2 [IU] via SUBCUTANEOUS
  Administered 2013-08-08 (×2): 3 [IU] via SUBCUTANEOUS
  Administered 2013-08-08: 2 [IU] via SUBCUTANEOUS
  Administered 2013-08-08: 3 [IU] via SUBCUTANEOUS
  Administered 2013-08-09: 04:00:00 1 [IU] via SUBCUTANEOUS

## 2013-08-06 MED ORDER — WARFARIN - PHYSICIAN DOSING INPATIENT: Freq: Every day | Status: DC

## 2013-08-06 MED ORDER — ASPIRIN EC 81 MG PO TBEC: 81.0000 mg | DELAYED_RELEASE_TABLET | Freq: Every day | ORAL | Status: DC

## 2013-08-06 MED ORDER — FINASTERIDE 5 MG PO TABS
5.0000 mg | ORAL_TABLET | Freq: Every evening | ORAL | Status: DC
  Administered 2013-08-06 – 2013-08-11 (×6): 5 mg via ORAL
  Filled 2013-08-06 (×6): qty 1

## 2013-08-06 MED ORDER — GUAIFENESIN-DM 100-10 MG/5ML PO SYRP
10.0000 mL | ORAL_SOLUTION | Freq: Four times a day (QID) | ORAL | Status: DC | PRN
  Filled 2013-08-06: qty 10

## 2013-08-06 MED ORDER — PANTOPRAZOLE SODIUM 40 MG PO TBEC
40.0000 mg | DELAYED_RELEASE_TABLET | Freq: Every day | ORAL | Status: DC
  Administered 2013-08-06 – 2013-08-11 (×6): 40 mg via ORAL
  Filled 2013-08-06 (×3): qty 1

## 2013-08-06 MED ORDER — ASPIRIN 300 MG RE SUPP
300.0000 mg | RECTAL | Status: AC
  Administered 2013-08-06: 300 mg via RECTAL
  Filled 2013-08-06: qty 1

## 2013-08-06 MED ORDER — BISACODYL 10 MG RE SUPP: 10.0000 mg | RECTAL | Status: DC | PRN

## 2013-08-06 MED ORDER — CARVEDILOL 3.125 MG PO TABS
3.1250 mg | ORAL_TABLET | Freq: Two times a day (BID) | ORAL | Status: DC
  Administered 2013-08-06 – 2013-08-11 (×7): 3.125 mg via ORAL
  Filled 2013-08-06 (×12): qty 1

## 2013-08-06 MED ORDER — MIDODRINE HCL 5 MG PO TABS
10.0000 mg | ORAL_TABLET | Freq: Three times a day (TID) | ORAL | Status: DC
  Administered 2013-08-06 – 2013-08-11 (×14): 10 mg via ORAL
  Filled 2013-08-06 (×17): qty 2

## 2013-08-06 MED ORDER — VANCOMYCIN HCL 10 G IV SOLR
1250.0000 mg | Freq: Once | INTRAVENOUS | Status: AC
  Administered 2013-08-06: 1250 mg via INTRAVENOUS
  Filled 2013-08-06: qty 1250

## 2013-08-06 MED ORDER — SODIUM CHLORIDE 0.9 % IV BOLUS (SEPSIS)
1000.0000 mL | Freq: Once | INTRAVENOUS | Status: AC
  Administered 2013-08-06: 1000 mL via INTRAVENOUS

## 2013-08-06 MED ORDER — ENOXAPARIN SODIUM 40 MG/0.4ML ~~LOC~~ SOLN
40.0000 mg | SUBCUTANEOUS | Status: DC
Start: 2013-08-06 — End: 2013-08-06
  Filled 2013-08-06: qty 0.4

## 2013-08-06 MED ORDER — IPRATROPIUM BROMIDE 0.02 % IN SOLN
0.5000 mg | Freq: Four times a day (QID) | RESPIRATORY_TRACT | Status: DC
  Administered 2013-08-06 (×2): 0.5 mg via RESPIRATORY_TRACT
  Filled 2013-08-06 (×3): qty 2.5

## 2013-08-06 MED ORDER — DARBEPOETIN ALFA-POLYSORBATE 60 MCG/0.3ML IJ SOLN
60.0000 ug | INTRAMUSCULAR | Status: DC
  Administered 2013-08-09: 60 ug via INTRAVENOUS
  Filled 2013-08-06: qty 0.3

## 2013-08-06 MED ORDER — ACETAMINOPHEN 325 MG PO TABS
650.0000 mg | ORAL_TABLET | ORAL | Status: DC | PRN
  Administered 2013-08-07: 650 mg via ORAL
  Filled 2013-08-06: qty 2

## 2013-08-06 MED ORDER — ASPIRIN EC 81 MG PO TBEC
81.0000 mg | DELAYED_RELEASE_TABLET | Freq: Every day | ORAL | Status: DC
  Administered 2013-08-07 – 2013-08-10 (×4): 81 mg via ORAL
  Filled 2013-08-06 (×5): qty 1

## 2013-08-06 MED ORDER — WARFARIN - PHARMACIST DOSING INPATIENT
Freq: Every day | Status: DC
  Administered 2013-08-06: 18:00:00

## 2013-08-06 MED ORDER — ALBUTEROL SULFATE (5 MG/ML) 0.5% IN NEBU
2.5000 mg | INHALATION_SOLUTION | Freq: Four times a day (QID) | RESPIRATORY_TRACT | Status: DC
  Administered 2013-08-06 (×2): 2.5 mg via RESPIRATORY_TRACT
  Filled 2013-08-06 (×3): qty 0.5

## 2013-08-06 MED ORDER — ONDANSETRON HCL 4 MG/2ML IJ SOLN: 4.0000 mg | Freq: Four times a day (QID) | INTRAMUSCULAR | Status: DC | PRN

## 2013-08-06 MED ORDER — CLINDAMYCIN PHOSPHATE 600 MG/50ML IV SOLN
600.0000 mg | Freq: Three times a day (TID) | INTRAVENOUS | Status: DC
  Filled 2013-08-06 (×3): qty 50

## 2013-08-06 MED ORDER — WARFARIN SODIUM 3 MG PO TABS
3.0000 mg | ORAL_TABLET | Freq: Every evening | ORAL | Status: DC
  Administered 2013-08-06 – 2013-08-08 (×3): 3 mg via ORAL
  Filled 2013-08-06 (×4): qty 1

## 2013-08-06 MED ORDER — LEVOTHYROXINE SODIUM 75 MCG PO TABS
75.0000 ug | ORAL_TABLET | Freq: Every day | ORAL | Status: DC
  Administered 2013-08-06 – 2013-08-07 (×2): 75 ug via ORAL
  Filled 2013-08-06 (×4): qty 1

## 2013-08-06 MED ORDER — NITROGLYCERIN 0.4 MG SL SUBL: 0.4000 mg | SUBLINGUAL_TABLET | SUBLINGUAL | Status: DC | PRN

## 2013-08-06 MED ORDER — ERTAPENEM SODIUM 1 G IJ SOLR
500.0000 mg | INTRAMUSCULAR | Status: DC
  Administered 2013-08-06: 0.5 g via INTRAVENOUS
  Filled 2013-08-06 (×2): qty 0.5

## 2013-08-06 MED ORDER — ACETAMINOPHEN 650 MG RE SUPP: 650.0000 mg | RECTAL | Status: DC | PRN

## 2013-08-06 MED ORDER — CALCIUM ACETATE 667 MG PO CAPS
1334.0000 mg | ORAL_CAPSULE | Freq: Three times a day (TID) | ORAL | Status: DC
  Administered 2013-08-07 – 2013-08-11 (×12): 1334 mg via ORAL
  Filled 2013-08-06 (×17): qty 2

## 2013-08-06 MED ORDER — GABAPENTIN 300 MG PO CAPS
300.0000 mg | ORAL_CAPSULE | Freq: Every morning | ORAL | Status: DC
  Administered 2013-08-06: 300 mg via ORAL
  Filled 2013-08-06 (×2): qty 1
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
## 2013-08-06 NOTE — Progress Notes (Signed)
Received pt. As a transfer from [facility]. Is from assisting living.pt. Has a stage II on his bottom(x).Form was apply on.Pt. Was place on tele box # 5.keep monitoring pt. And assessing his needs.Admitting MD. Has been page.waiting for orders.

## 2013-08-06 NOTE — Care Management (Signed)
79 y/o M PMHx diabetes type 2, HTN, ESRD on HD M/W/F (missed appointment today),S/P CABG, S/P ICD implantation, A. Fib,Hx NSTEMI, S/P AKA who was just discharged on 07/05/2013 (4 respiratory failure secondary to HCAP) to a SNF. Per Dr Merrilee Seashore @ [facility] patient did not complete HD today secondary to having a fever of 104. Howeveron presentation to ED temperature was normal. Patient however did have a leukocytosis (14) with 3% bands, BNP= 55,000, SOB, elevated troponin, and  questionable LLL infiltrate.
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
## 2013-08-07 ENCOUNTER — Encounter (HOSPITAL_COMMUNITY): Payer: Self-pay | Admitting: Physician Assistant

## 2013-08-07 DIAGNOSIS — R4182 Altered mental status, unspecified: Secondary | ICD-10-CM

## 2013-08-07 DIAGNOSIS — I4891 Unspecified atrial fibrillation: Secondary | ICD-10-CM

## 2013-08-07 DIAGNOSIS — R9431 Abnormal electrocardiogram [ECG] [EKG]: Secondary | ICD-10-CM

## 2013-08-07 LAB — TROPONIN I: Troponin I: <0.3 ng/mL (ref <0.30)

## 2013-08-07 LAB — GLUCOSE, CAPILLARY
Glucose-Capillary: 174 mg/dL — ABNORMAL HIGH (ref 70–99)
Glucose-Capillary: 210 mg/dL — ABNORMAL HIGH (ref 70–99)

## 2013-08-07 LAB — RENAL FUNCTION PANEL
CO2: 23 mEq/L (ref 19–32)
Calcium: 9 mg/dL (ref 8.4–10.5)
Chloride: 95 mEq/L — ABNORMAL LOW (ref 96–112)
GFR calc Af Amer: 7 mL/min — ABNORMAL LOW (ref >90)
GFR calc non Af Amer: 6 mL/min — ABNORMAL LOW (ref >90)
Sodium: 136 mEq/L (ref 135–145)

## 2013-08-07 LAB — CBC
Hemoglobin: 9.3 g/dL — ABNORMAL LOW (ref 13.0–17.0)
MCHC: 31.5 g/dL (ref 30.0–36.0)
Platelets: 244 10*3/uL (ref 150–400)

## 2013-08-07 LAB — PROTIME-INR: Prothrombin Time: 25.4 seconds — ABNORMAL HIGH (ref 11.6–15.2)

## 2013-08-07 MED ORDER — IPRATROPIUM BROMIDE 0.02 % IN SOLN
0.5000 mg | Freq: Four times a day (QID) | RESPIRATORY_TRACT | Status: DC
  Administered 2013-08-07 – 2013-08-08 (×4): 0.5 mg via RESPIRATORY_TRACT
  Filled 2013-08-07 (×4): qty 2.5

## 2013-08-07 MED ORDER — RENA-VITE PO TABS
1.0000 | ORAL_TABLET | Freq: Every day | ORAL | Status: DC
  Administered 2013-08-07 – 2013-08-10 (×4): 1 via ORAL
  Filled 2013-08-07 (×5): qty 1

## 2013-08-07 MED ORDER — HEPARIN SODIUM (PORCINE) 1000 UNIT/ML DIALYSIS: 1000.0000 [IU] | INTRAMUSCULAR | Status: DC | PRN

## 2013-08-07 MED ORDER — IPRATROPIUM BROMIDE 0.02 % IN SOLN: 0.5000 mg | Freq: Four times a day (QID) | RESPIRATORY_TRACT | Status: DC

## 2013-08-07 MED ORDER — SODIUM CHLORIDE 0.9 % IV SOLN: 100.0000 mL | INTRAVENOUS | Status: DC | PRN

## 2013-08-07 MED ORDER — VANCOMYCIN HCL IN DEXTROSE 750-5 MG/150ML-% IV SOLN
750.0000 mg | INTRAVENOUS | Status: AC
  Administered 2013-08-07: 750 mg via INTRAVENOUS
  Filled 2013-08-07: qty 150

## 2013-08-07 MED ORDER — NEPRO/CARBSTEADY PO LIQD: 237.0000 mL | ORAL | Status: DC | PRN

## 2013-08-07 MED ORDER — GABAPENTIN 300 MG PO CAPS
300.0000 mg | ORAL_CAPSULE | Freq: Every day | ORAL | Status: DC
  Administered 2013-08-08 – 2013-08-10 (×3): 300 mg via ORAL
  Filled 2013-08-07 (×4): qty 1

## 2013-08-07 MED ORDER — PENTAFLUOROPROP-TETRAFLUOROETH EX AERO: 1.0000 "application " | INHALATION_SPRAY | CUTANEOUS | Status: DC | PRN

## 2013-08-07 MED ORDER — HEPARIN SODIUM (PORCINE) 1000 UNIT/ML DIALYSIS
2000.0000 [IU] | Freq: Once | INTRAMUSCULAR | Status: AC
  Administered 2013-08-07: 2000 [IU] via INTRAVENOUS_CENTRAL

## 2013-08-07 MED ORDER — ALTEPLASE 2 MG IJ SOLR: 2.0000 mg | Freq: Once | INTRAMUSCULAR | Status: DC | PRN

## 2013-08-07 MED ORDER — VANCOMYCIN HCL IN DEXTROSE 750-5 MG/150ML-% IV SOLN
750.0000 mg | INTRAVENOUS | Status: DC
  Filled 2013-08-07: qty 150

## 2013-08-07 MED ORDER — ALBUTEROL SULFATE (5 MG/ML) 0.5% IN NEBU: 2.5000 mg | INHALATION_SOLUTION | Freq: Four times a day (QID) | RESPIRATORY_TRACT | Status: DC

## 2013-08-07 MED ORDER — ALBUTEROL SULFATE (5 MG/ML) 0.5% IN NEBU
2.5000 mg | INHALATION_SOLUTION | Freq: Four times a day (QID) | RESPIRATORY_TRACT | Status: DC
  Administered 2013-08-07 – 2013-08-08 (×4): 2.5 mg via RESPIRATORY_TRACT
  Filled 2013-08-07 (×4): qty 0.5

## 2013-08-07 MED ORDER — LIDOCAINE-PRILOCAINE 2.5-2.5 % EX CREA: 1.0000 "application " | TOPICAL_CREAM | CUTANEOUS | Status: DC | PRN

## 2013-08-07 MED ORDER — LIDOCAINE HCL (PF) 1 % IJ SOLN: 5.0000 mL | INTRAMUSCULAR | Status: DC | PRN

## 2013-08-07 MED ORDER — SODIUM CHLORIDE 0.9 % IV SOLN
500.0000 mg | INTRAVENOUS | Status: DC
  Administered 2013-08-07 – 2013-08-09 (×3): 500 mg via INTRAVENOUS
  Filled 2013-08-07 (×5): qty 0.5

## 2013-08-07 NOTE — Progress Notes (Signed)
Brief Nutrition Note:   RD pulled to pt for positive malnutrition screening tool.  Pt states he has not been eating well for the past few days. Weight hx shows weight gain in the past week.  Wt Readings from Last 10 Encounters:  08/07/13 160 lb 4.4 oz (72.7 kg)  08/01/13 154 lb 12.2 oz (70.2 kg)  07/05/13 159 lb 2.8 oz (72.2 kg)  06/01/12 158 lb (71.668 kg)  06/01/12 158 lb (71.668 kg)  05/24/12 158 lb (71.668 kg)  04/10/12 158 lb 4.6 oz (71.8 kg)  04/10/12 158 lb 4.6 oz (71.8 kg)  04/10/12 158 lb 4.6 oz (71.8 kg)  04/10/12 158 lb 4.6 oz (71.8 kg)    Some weight gain could be related to fluid status. At last admission (d/c'd on 8/28) pt was eating 100%. Pt ate well for lunch today, about 75%. Pt denies using oral nutrition supplements at baseline.   BMI = 27.9 adj for AKA, overweight.   No nutrition interventions warranted at this time. Please consult as needed.   Clarene Duke RD, LDN Pager [phone] After Hours pager [phone]

## 2013-08-07 NOTE — Progress Notes (Signed)
ANTIBIOTIC CONSULT NOTE - FOLLOW UP  Pharmacy Consult for Vancomycin + Warfarin  Indication: Empiric HCAP coverage and hx Afib  Allergies  Allergen Reactions  . Morphine And Related Other (See Comments)    "makes me hallucinate"  . Penicillins Rash  . Nsaids Other (See Comments)    "don't really know"    Patient Measurements: Height: 5\' 7"  (170.2 cm) Weight: 165 lb 5.5 oz (75 kg) IBW/kg (Calculated) : 66.1  Vital Signs: Temp: 97.9 F (36.6 C) (09/02 0730) Temp src: Oral (09/02 0730) BP: 96/62 mmHg (09/02 1000) Pulse Rate: 96 (09/02 1000) Intake/Output from previous day:   Intake/Output from this shift:    Labs:  Recent Labs  08/06/13 1332 08/07/13 0750 08/07/13 0758  WBC 17.3*  --  7.8  HGB 9.1*  --  9.3*  PLT 293  --  244  CREATININE 6.72* 7.48*  --    Estimated Creatinine Clearance: 7.7 ml/min (by C-G formula based on Cr of 7.48). No results found for this basename: VANCOTROUGH, Leodis Binet, VANCORANDOM, GENTTROUGH, GENTPEAK, GENTRANDOM, TOBRATROUGH, TOBRAPEAK, TOBRARND, AMIKACINPEAK, AMIKACINTROU, AMIKACIN,  in the last 72 hours   Microbiology: Recent Results (from the past 720 hour(s))  MRSA PCR SCREENING     Status: None   Collection Time    07/30/13 12:07 AM      Result Value Range Status   MRSA by PCR NEGATIVE  NEGATIVE Final   Comment:            The GeneXpert MRSA Assay (FDA     approved for NASAL specimens     only), is one component of a     comprehensive MRSA colonization     surveillance program. It is not     intended to diagnose MRSA     infection nor to guide or     monitor treatment for     MRSA infections.  URINE CULTURE     Status: None   Collection Time    07/30/13 12:43 AM      Result Value Range Status   Specimen Description URINE, RANDOM   Final   Special Requests ADDED 0222   Final   Culture  Setup Time     Final   Value: 07/30/2013 01:57     Performed at [facility] Count     Final   Value: >=100,000  COLONIES/ML     Performed at [facility]   Culture     Final   Value: ESCHERICHIA COLI     Note: Confirmed Extended Spectrum Beta-Lactamase Producer (ESBL) CRITICAL RESULT CALLED TO, READ BACK BY AND VERIFIED WITH: ALBERT PERIO @ 7:43AM  08/02/13 BY DWEEKS FOSFOMYCIN S 0.75ug/mL     PROTEUS MIRABILIS     Note: FOSFOMYCIN 0.50ug/mL     Performed at [facility]   Report Status 08/03/2013 FINAL   Final   Organism ID, Bacteria PROTEUS MIRABILIS   Final   Organism ID, Bacteria ESCHERICHIA COLI   Final   Organism ID, Bacteria PROTEUS MIRABILIS   Final  CULTURE, BLOOD (ROUTINE X 2)     Status: None   Collection Time    08/06/13  2:23 PM      Result Value Range Status   Specimen Description BLOOD LEFT HAND   Final   Special Requests BOTTLES DRAWN AEROBIC ONLY 2CC   Final   Culture  Setup Time     Final   Value: 08/06/2013 18:50     Performed  at [facility]     Final   Value:        BLOOD CULTURE RECEIVED NO GROWTH TO DATE CULTURE WILL BE HELD FOR 5 DAYS BEFORE ISSUING A FINAL NEGATIVE REPORT     Performed at [facility]   Report Status PENDING   Incomplete  CULTURE, BLOOD (ROUTINE X 2)     Status: None   Collection Time    08/06/13  2:35 PM      Result Value Range Status   Specimen Description BLOOD LEFT HAND   Final   Special Requests BOTTLES DRAWN AEROBIC ONLY 1CC   Final   Culture  Setup Time     Final   Value: 08/06/2013 18:51     Performed at [facility]   Culture     Final   Value:        BLOOD CULTURE RECEIVED NO GROWTH TO DATE CULTURE WILL BE HELD FOR 5 DAYS BEFORE ISSUING A FINAL NEGATIVE REPORT     Performed at [facility]   Report Status PENDING   Incomplete    Anti-infectives   Start     Dose/Rate Route Frequency Ordered Stop   08/07/13 2000  meropenem (MERREM) 500 mg in sodium chloride 0.9 % 50 mL IVPB     500 mg 100 mL/hr over 30 Minutes Intravenous Every 24 hours 08/07/13 0959     08/07/13 1200   vancomycin (VANCOCIN) IVPB 750 mg/150 ml premix     750 mg 150 mL/hr over 60 Minutes Intravenous Every Tue (Hemodialysis) 08/07/13 0830 08/14/13 1159   08/06/13 1600  vancomycin (VANCOCIN) 1,250 mg in sodium chloride 0.9 % 250 mL IVPB     1,250 mg 166.7 mL/hr over 90 Minutes Intravenous  Once 08/06/13 1511 08/06/13 1842   08/06/13 1600  ertapenem (INVANZ) 0.5 g in sodium chloride 0.9 % 50 mL IVPB  Status:  Discontinued     500 mg 100 mL/hr over 30 Minutes Intravenous Every 24 hours 08/06/13 1531 08/07/13 0957   08/06/13 1500  clindamycin (CLEOCIN) IVPB 600 mg  Status:  Discontinued     600 mg 100 mL/hr over 30 Minutes Intravenous 3 times per day 08/06/13 1429 08/06/13 1531      Assessment: 79 y.o. M who presented with SOB and fatigue on 9/1 and was started on empiric antibiotics for rule out HCAP. The patient was switched from Ertapenem to Meropenem today for gram negative coverage, and continues on Vancomycin per Rx. The patient was loaded with Vancomycin 1250 mg on 9/1 and is noted to be receiving HD off-schedule today. Will plan to order Vancomycin maintenance dose for today, then will plan on resuming maintenance doses on home MWF schedule.   The patient also was continued on home warfarin for hx Afib. INR today is therapeutic (INR 2.4 << 2.54, goal of 2-3). Hgb/Hct/Plt stable. No overt s/x of bleeding noted.   Goal of Therapy:  Pre-HD Vancomycin level of 15-25 mcg/ml INR 2-3  Plan:  1. Vancomycin 750 mg IV post HD today 2. Vancomycin 750 mg IV post HD sessions on MWF 3. Continue Warfarin 3 mg daily 4. Will continue to follow renal function, culture results, LOT, and antibiotic de-escalation plans  5. Will continue to monitor for any signs/symptoms of bleeding and will follow up with PT/INR in the a.m.   Georgina Pillion, PharmD, BCPS Clinical Pharmacist Pager: [phone] 08/07/2013 10:28 AM
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
## 2013-08-07 NOTE — Progress Notes (Signed)
[facility]                                                                                Patient Demographics  [name] [name], is a 79 y.o. male, DOB - 10/23/1934, WUJ:[id number]  Admit date - 08/06/2013   Admitting Physician Drema Dallas, MD  Outpatient Primary MD for the patient is SOPALA,JERZY, MD  LOS - 1   No chief complaint on file.       Assessment & Plan     1.HCAP - with fevers, leukocytosis,SIRS - has productive cough and was recently hospitalized, mental status was poor upon admission, question slight aspiration, he has responded very well to empiric antibiotics, hemodynamically stable and tolerating dialysis, we'll continue aspiration precautions, feeding assistance and will have PT evaluate the patient. Monitor blood cultures. Taper to oral antibiotics within 48 hours if continues to improve clinically.     2. ESRD; patient on HD M/W/F, renal has seen the patient he's been dialyzed 08-2013 as he missed dialysis on Monday.     3. Atrial fib; continue patient on Coumadin (pharmacy to manage). Also on low-dose aspirin which will be continued, he has chronic hypotension and cannot tolerate beta blocker/calcium channel blocker.    4. CABG/ICD in implant/history NSTEMI; - chest pain-free, first set troponin negative, had some nonspecific EKG changes upon admission and the admitting physician had consulted Dr. Thurmon Fair (Cardiology) , we will try and troponin, continue low-dose aspirin and monitor clinically. He's not a candidate for invasive procedures.    5. DM type II; patient placed on moderate SSI for control of glucose .  Lab Results  Component Value Date   HGBA1C 7.5* 08/06/2013    CBG (last 3)   Recent Labs  08/06/13 2041 08/06/13 2332 08/07/13 0420  GLUCAP 143* 100* 174*     6. Chronic hypotension. This has been a chronic problem for the patient which limits fluid removal during dialysis and from time to time puts him in pulmonary edema,  with max dose midodrine blood pressures are somewhat stable we will monitor.    7. Mental status change on admission - he is back to baseline, decreased mental status was due to infection from #1 above.     Code Status: DNR  Family Communication: none present  Disposition Plan: ALF   Procedures     Consults  Renal   DVT Prophylaxis  Coumadin  Lab Results  Component Value Date   INR 2.54* 08/06/2013   INR 2.43* 08/02/2013   INR 4.40* 08/01/2013      Lab Results  Component Value Date   PLT 244 08/07/2013    Medications  Scheduled Meds: . ipratropium  0.5 mg Nebulization QID   And  . albuterol  2.5 mg Nebulization QID  . aspirin EC  81 mg Oral Daily  . calcium acetate  1,334 mg Oral TID WC  . carvedilol  3.125 mg Oral BID WC  . [START ON 08/08/2013] darbepoetin  60 mcg Intravenous Q Wed-HD  . ertapenem  500 mg Intravenous Q24H  . finasteride  5 mg Oral QPM  . gabapentin  300 mg Oral q morning - 10a  . insulin  aspart  0-15 Units Subcutaneous Q4H  . levothyroxine  75 mcg Oral QHS  . midodrine  10 mg Oral TID WC  . pantoprazole  40 mg Oral Daily  . warfarin  3 mg Oral QPM  . Warfarin - Pharmacist Dosing Inpatient   Does not apply q1800   Continuous Infusions:  PRN Meds:.acetaminophen, acetaminophen, bisacodyl, guaiFENesin-dextromethorphan, nitroGLYCERIN, ondansetron (ZOFRAN) IV  Antibiotics     Anti-infectives   Start     Dose/Rate Route Frequency Ordered Stop   08/06/13 1600  vancomycin (VANCOCIN) 1,250 mg in sodium chloride 0.9 % 250 mL IVPB     1,250 mg 166.7 mL/hr over 90 Minutes Intravenous  Once 08/06/13 1511 08/06/13 1842   08/06/13 1600  ertapenem (INVANZ) 0.5 g in sodium chloride 0.9 % 50 mL IVPB     500 mg 100 mL/hr over 30 Minutes Intravenous Every 24 hours 08/06/13 1531     08/06/13 1500  clindamycin (CLEOCIN) IVPB 600 mg  Status:  Discontinued     600 mg 100 mL/hr over 30 Minutes Intravenous 3 times per day 08/06/13 1429 08/06/13 1531        Time Spent in minutes   35   SINGH,PRASHANT K M.D on 08/07/2013 at 8:19 AM  Between 7am to 7pm - Pager - [phone]  After 7pm go to [email or web address] - password [email or web address]  And look for the night coverage person covering for me after hours  [facility] Office  [phone]    Subjective:   [name] [name] today has, No headache, No chest pain, No abdominal pain - No Nausea, No new weakness tingling or numbness, +productive cough no SOB.    Objective:   Filed Vitals:   08/07/13 0730 08/07/13 0750 08/07/13 0755 08/07/13 0800  BP: 115/62 108/65 100/60 101/63  Pulse: 66 85 87 93  Temp: 97.9 F (36.6 C)     TempSrc: Oral     Resp: 18  16 17   Height:      Weight: 75 kg (165 lb 5.5 oz)     SpO2: 100%   100%    Wt Readings from Last 3 Encounters:  08/07/13 75 kg (165 lb 5.5 oz)  08/01/13 70.2 kg (154 lb 12.2 oz)  07/05/13 72.2 kg (159 lb 2.8 oz)     Intake/Output Summary (Last 24 hours) at 08/07/13 0819 Last data filed at 08/06/13 1327  Gross per 24 hour  Intake      0 ml  Output      0 ml  Net      0 ml    Exam Awake Alert, Oriented X 3, No new F.N deficits, Normal affect [place].AT,PERRAL Supple Neck,No JVD, No cervical lymphadenopathy appriciated.  Symmetrical Chest wall movement, Good air movement bilaterally, few basilar rales RRR,No Gallops,Rubs or new Murmurs, No Parasternal Heave +ve B.Sounds, Abd Soft, Non tender, No organomegaly appriciated, No rebound - guarding or rigidity. No Cyanosis, Clubbing or edema, No new Rash or bruise, R AKA   Data Review   Micro Results Recent Results (from the past 240 hour(s))  MRSA PCR SCREENING     Status: None   Collection Time    07/30/13 12:07 AM      Result Value Range Status   MRSA by PCR NEGATIVE  NEGATIVE Final   Comment:            The GeneXpert MRSA Assay (FDA     approved for NASAL specimens     only), is one component of a  comprehensive MRSA colonization     surveillance program. It is not      intended to diagnose MRSA     infection nor to guide or     monitor treatment for     MRSA infections.  URINE CULTURE     Status: None   Collection Time    07/30/13 12:43 AM      Result Value Range Status   Specimen Description URINE, RANDOM   Final   Special Requests ADDED 0222   Final   Culture  Setup Time     Final   Value: 07/30/2013 01:57     Performed at [facility] Count     Final   Value: >=100,000 COLONIES/ML     Performed at [facility]   Culture     Final   Value: ESCHERICHIA COLI     Note: Confirmed Extended Spectrum Beta-Lactamase Producer (ESBL) CRITICAL RESULT CALLED TO, READ BACK BY AND VERIFIED WITH: ALBERT PERIO @ 7:43AM  08/02/13 BY DWEEKS FOSFOMYCIN S 0.75ug/mL     PROTEUS MIRABILIS     Note: FOSFOMYCIN 0.50ug/mL     Performed at [facility]   Report Status 08/03/2013 FINAL   Final   Organism ID, Bacteria PROTEUS MIRABILIS   Final   Organism ID, Bacteria ESCHERICHIA COLI   Final   Organism ID, Bacteria PROTEUS MIRABILIS   Final    Radiology Reports Dg Chest Port 1 View  08/06/2013   *RADIOLOGY REPORT*  Clinical Data: Shortness of breath.  End-stage renal disease - missed dialysis.  PORTABLE CHEST - 1 VIEW  Comparison: 08/06/2013  Findings: Cardiomegaly, CABG changes and left-sided AICD / pacemaker again noted. Pulmonary vascular congestion noted with bilateral pleural effusions and bibasilar atelectasis. Possible mild interstitial edema noted. No acute bony abnormalities are present. Remote rib fractures are identified.  IMPRESSION: Cardiomegaly, pulmonary vascular congestion and possible mild interstitial edema.  Bilateral pleural effusions and bibasilar atelectasis.   Original Report Authenticated By: Harmon Pier, M.D.   Dg Chest Port 1 View  08/02/2013   *RADIOLOGY REPORT*  Clinical Data: Follow up of pulmonary edema.  Shortness of breath.  PORTABLE CHEST - 1 VIEW  Comparison: 1 day prior  Findings: Pacer / AICD device.  Prior median sternotomy.  Patient rotated to the left. Cardiomegaly accentuated by AP portable technique.  Cannot exclude small left pleural effusion. No pneumothorax.  No significant change in mild interstitial edema. Improved right upper lobe airspace disease.  Persistent left base air space disease.  IMPRESSION: Similar congestive heart failure with slight improvement in right upper lobe airspace disease.  Similar left base atelectasis or infection with adjacent small left pleural effusion.   Original Report Authenticated By: Jeronimo Greaves, M.D.   Dg Chest Port 1 View  08/01/2013   *RADIOLOGY REPORT*  Clinical Data: Follow up of pulmonary edema.  PORTABLE CHEST - 1 VIEW  Comparison: 07/29/2013  Findings: Patient rotated to the left.  Pacer / AICD device with leads right atrium right ventricle. Prior median sternotomy. Cardiomegaly accentuated by AP portable technique.  No pneumothorax.  Small left and trace right pleural effusions are similar.  Mild to moderate interstitial edema is not significantly changed.  Lower lobe predominant airspace opacities are greater on the left than right and similar.  IMPRESSION: No significant change since the prior exam.  Congestive heart failure with bilateral pleural effusions and lower lobe predominant airspace disease.  This could represent atelectasis or concurrent infection.  Suboptimal  patient positioning.   Original Report Authenticated By: Jeronimo Greaves, M.D.    CBC  Recent Labs Lab 08/01/13 1143 08/06/13 1332 08/07/13 0758  WBC 7.1 17.3* 7.8  HGB 8.8* 9.1* 9.3*  HCT 28.6* 28.9* 29.5*  PLT 200 293 244  MCV 105.1* 103.2* 102.8*  MCH 32.4 32.5 32.4  MCHC 30.8 31.5 31.5  RDW 15.8* 16.1* 15.9*  LYMPHSABS  --  0.7  --   MONOABS  --  0.8  --   EOSABS  --  0.0  --   BASOSABS  --  0.0  --     Chemistries   Recent Labs Lab 08/01/13 1100 08/06/13 1332  NA 132* 137  K 4.7 3.8  CL 93* 96  CO2 27 25  GLUCOSE 217* 203*  BUN 45* 67*  CREATININE 4.84*  6.72*  CALCIUM 9.3 9.1  MG  --  2.1  AST  --  11  ALT  --  12  ALKPHOS  --  83  BILITOT  --  0.2*   ------------------------------------------------------------------------------------------------------------------ estimated creatinine clearance is 8.6 ml/min (by C-G formula based on Cr of 6.72). ------------------------------------------------------------------------------------------------------------------  Recent Labs  08/06/13 1332  HGBA1C 7.5*   ------------------------------------------------------------------------------------------------------------------  Recent Labs  08/06/13 1401  CHOL 120  HDL 24*  LDLCALC 71  TRIG 657  CHOLHDL 5.0   ------------------------------------------------------------------------------------------------------------------  Recent Labs  08/06/13 1332  TSH 0.811   ------------------------------------------------------------------------------------------------------------------ No results found for this basename: VITAMINB12, FOLATE, FERRITIN, TIBC, IRON, RETICCTPCT,  in the last 72 hours  Coagulation profile  Recent Labs Lab 08/01/13 1100 08/02/13 0607 08/06/13 1332  INR 4.40* 2.43* 2.54*    No results found for this basename: DDIMER,  in the last 72 hours  Cardiac Enzymes  Recent Labs Lab 08/06/13 1332  TROPONINI <0.30   ------------------------------------------------------------------------------------------------------------------ No components found with this basename: POCBNP,

## 2013-08-07 NOTE — Progress Notes (Signed)
SPEECH PATHOLOGY   Attempted to see pt. For bedside swallow evaluation.  Pt. Is currently in dialysis.  SLP will f/u when pt. Is available and able to participate.  Maryjo Rochester T

## 2013-08-07 NOTE — Clinical Social Work Psychosocial (Signed)
Clinical Social Work Department BRIEF PSYCHOSOCIAL ASSESSMENT 08/07/2013  Patient:  [name] [name], [name] [name]     Account Number:  [id number]     Admit date:  08/06/2013  Clinical Social Worker:  Delmer Islam  Date/Time:  08/07/2013 04:49 PM  Referred by:  CSW  Date Referred:  08/07/2013 Referred for  SNF Placement   Other Referral:   Interview type:  Patient Other interview type:    PSYCHOSOCIAL DATA Living Status:  FACILITY Admitted from facility:  [facility] Level of care:  Skilled Nursing Facility Primary support name:  [name] [name] Primary support relationship to patient:  CHILD, ADULT Degree of support available:   Patient is married and has a son, [name] [name]. and another daughter, [name] [name].    CURRENT CONCERNS Current Concerns  Post-Acute Placement   Other Concerns:    SOCIAL WORK ASSESSMENT / PLAN CSW intern introudce self and stated purpose of visit. CSW intern confirmed with patient his plan of returning  to [facility] when medically ready for discharge.   Assessment/plan status:  Psychosocial Support/Ongoing Assessment of Needs Other assessment/ plan:   Information/referral to community resources:   None needed or requested at this time.    PATIENT'S/FAMILY'S RESPONSE TO PLAN OF CARE: Patient was pleasant and open to talking with CSW intern about discharge planning.   Assessment completed by [name] [name], CSW Intern. Assessment reviewed and approved by [name] Bal, LCSW
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
## 2013-08-07 NOTE — Consult Note (Signed)
I have seen and examined this patient and agree with the plan of care . Seen on HD no issues. Afebrile overnight . AVF good flow and BP 110/70 Jo-Anne Kluth W 08/07/2013, 7:50 AM

## 2013-08-08 ENCOUNTER — Inpatient Hospital Stay (HOSPITAL_COMMUNITY): Payer: PRIVATE HEALTH INSURANCE

## 2013-08-08 DIAGNOSIS — I2510 Atherosclerotic heart disease of native coronary artery without angina pectoris: Secondary | ICD-10-CM

## 2013-08-08 DIAGNOSIS — J810 Acute pulmonary edema: Secondary | ICD-10-CM

## 2013-08-08 DIAGNOSIS — J9600 Acute respiratory failure, unspecified whether with hypoxia or hypercapnia: Secondary | ICD-10-CM

## 2013-08-08 LAB — GLUCOSE, CAPILLARY
Glucose-Capillary: 105 mg/dL — ABNORMAL HIGH (ref 70–99)
Glucose-Capillary: 112 mg/dL — ABNORMAL HIGH (ref 70–99)
Glucose-Capillary: 133 mg/dL — ABNORMAL HIGH (ref 70–99)
Glucose-Capillary: 155 mg/dL — ABNORMAL HIGH (ref 70–99)
Glucose-Capillary: 189 mg/dL — ABNORMAL HIGH (ref 70–99)

## 2013-08-08 LAB — CBC
MCH: 32.6 pg (ref 26.0–34.0)
MCHC: 31.3 g/dL (ref 30.0–36.0)
Platelets: 282 10*3/uL (ref 150–400)
RBC: 3.25 MIL/uL — ABNORMAL LOW (ref 4.22–5.81)
RDW: 16 % — ABNORMAL HIGH (ref 11.5–15.5)

## 2013-08-08 LAB — PROTIME-INR: Prothrombin Time: 27.5 seconds — ABNORMAL HIGH (ref 11.6–15.2)

## 2013-08-08 MED ORDER — ALBUTEROL SULFATE (5 MG/ML) 0.5% IN NEBU
2.5000 mg | INHALATION_SOLUTION | Freq: Two times a day (BID) | RESPIRATORY_TRACT | Status: DC
  Administered 2013-08-08 – 2013-08-11 (×6): 2.5 mg via RESPIRATORY_TRACT
  Filled 2013-08-08 (×6): qty 0.5

## 2013-08-08 MED ORDER — IPRATROPIUM BROMIDE 0.02 % IN SOLN
0.5000 mg | Freq: Two times a day (BID) | RESPIRATORY_TRACT | Status: DC
  Administered 2013-08-08 – 2013-08-11 (×6): 0.5 mg via RESPIRATORY_TRACT
  Filled 2013-08-08 (×6): qty 2.5

## 2013-08-08 MED ORDER — POLYVINYL ALCOHOL 1.4 % OP SOLN
1.0000 [drp] | OPHTHALMIC | Status: DC | PRN
  Administered 2013-08-09 (×2): 1 [drp] via OPHTHALMIC
  Filled 2013-08-08: qty 15

## 2013-08-08 MED ORDER — LEVOTHYROXINE SODIUM 75 MCG PO TABS
75.0000 ug | ORAL_TABLET | Freq: Every day | ORAL | Status: DC
  Administered 2013-08-08 – 2013-08-11 (×3): 75 ug via ORAL
  Filled 2013-08-08 (×5): qty 1

## 2013-08-08 NOTE — Progress Notes (Signed)
[facility] PROGRESS NOTE  [name] [name] [name] [name]:[id number] DOB: 12-14-1933 DOA: 08/06/2013 PCP: [name], MD  Assessment/Plan: 1.HCAP - improving now with aggressive treatment,- admitted with productive cough and was recently hospitalized, mental status was poor upon admission, question slight aspiration, he has responded very well to empiric antibiotics, hemodynamically stable and tolerating dialysis, we'll continue aspiration precautions, feeding assistance and will have PT evaluate the patient. Pt for MBS study today.  Monitor blood cultures.   2. ESRD; patient on HD M/W/F, renal has seen the patient he's been dialyzed 08-2013 as he missed dialysis on Monday.    3. Atrial fib; continue patient on Coumadin (pharmacy to manage). Also on low-dose aspirin which will be continued, he has chronic hypotension and cannot tolerate beta blocker/calcium channel blocker.   4. CABG/ICD in implant/history NSTEMI; - chest pain-free, first set troponin negative, had some nonspecific EKG changes upon admission and the admitting physician had consulted Dr. Thurmon Fair (Cardiology) , we will try and troponin, continue low-dose aspirin and monitor clinically. He's not a candidate for invasive procedures.   5. DM type II; patient placed on moderate SSI for control of glucose .  Blood sugars controlled.   Code Status: DNR  HPI/Subjective: Pt without complaints today.  Pt reporting that he is breathing better.  He is sitting up in chair.  Denies SOB.  Denies CP.   Objective: Filed Vitals:   08/08/13 0425  BP: 94/53  Pulse: 69  Temp: 97.6 F (36.4 C)  Resp: 16    Intake/Output Summary (Last 24 hours) at 08/08/13 0817 Last data filed at 08/08/13 6295  Gross per 24 hour  Intake    410 ml  Output   2700 ml  Net  -2290 ml   Filed Weights   08/07/13 0730 08/07/13 1130 08/07/13 2032  Weight: 75 kg (165 lb 5.5 oz) 72.7 kg (160 lb 4.4 oz) 73.029 kg (161 lb)    Exam:   General:  Awake,  alert, no distress, cooperative  Cardiovascular: irreg, normal s1, s2 sounds  Respiratory: BBS no crackles or rales heard   Abdomen: soft, nondistended, no masses palpated   Musculoskeletal: no pretibial edema  Data Reviewed: Basic Metabolic Panel:  Recent Labs Lab 08/01/13 1100 08/06/13 1332 08/07/13 0750  NA 132* 137 136  K 4.7 3.8 3.7  CL 93* 96 95*  CO2 27 25 23   GLUCOSE 217* 203* 151*  BUN 45* 67* 74*  CREATININE 4.84* 6.72* 7.48*  CALCIUM 9.3 9.1 9.0  MG  --  2.1  --   PHOS  --   --  3.6   Liver Function Tests:  Recent Labs Lab 08/06/13 1332 08/07/13 0750  AST 11  --   ALT 12  --   ALKPHOS 83  --   BILITOT 0.2*  --   PROT 6.2  --   ALBUMIN 2.5* 2.5*   No results found for this basename: LIPASE, AMYLASE,  in the last 168 hours No results found for this basename: AMMONIA,  in the last 168 hours CBC:  Recent Labs Lab 08/01/13 1143 08/06/13 1332 08/07/13 0758  WBC 7.1 17.3* 7.8  NEUTROABS  --  15.8*  --   HGB 8.8* 9.1* 9.3*  HCT 28.6* 28.9* 29.5*  MCV 105.1* 103.2* 102.8*  PLT 200 293 244   Cardiac Enzymes:  Recent Labs Lab 08/06/13 1332 08/07/13 1355  TROPONINI <0.30 <0.30   BNP (last 3 results) No results found for this basename: PROBNP,  in the last 8760  hours CBG:  Recent Labs Lab 08/07/13 0420 08/07/13 1220 08/07/13 1714 08/07/13 2013 08/08/13 0035  GLUCAP 174* 131* 210* 134* 112*    Recent Results (from the past 240 hour(s))  MRSA PCR SCREENING     Status: None   Collection Time    07/30/13 12:07 AM      Result Value Range Status   MRSA by PCR NEGATIVE  NEGATIVE Final   Comment:            The GeneXpert MRSA Assay (FDA     approved for NASAL specimens     only), is one component of a     comprehensive MRSA colonization     surveillance program. It is not     intended to diagnose MRSA     infection nor to guide or     monitor treatment for     MRSA infections.  URINE CULTURE     Status: None   Collection Time     07/30/13 12:43 AM      Result Value Range Status   Specimen Description URINE, RANDOM   Final   Special Requests ADDED 0222   Final   Culture  Setup Time     Final   Value: 07/30/2013 01:57     Performed at [facility] Count     Final   Value: >=100,000 COLONIES/ML     Performed at [facility]   Culture     Final   Value: ESCHERICHIA COLI     Note: Confirmed Extended Spectrum Beta-Lactamase Producer (ESBL) CRITICAL RESULT CALLED TO, READ BACK BY AND VERIFIED WITH: ALBERT PERIO @ 7:43AM  08/02/13 BY DWEEKS FOSFOMYCIN S 0.75ug/mL     PROTEUS MIRABILIS     Note: FOSFOMYCIN 0.50ug/mL     Performed at [facility]   Report Status 08/03/2013 FINAL   Final   Organism ID, Bacteria PROTEUS MIRABILIS   Final   Organism ID, Bacteria ESCHERICHIA COLI   Final   Organism ID, Bacteria PROTEUS MIRABILIS   Final  CULTURE, BLOOD (ROUTINE X 2)     Status: None   Collection Time    08/06/13  2:23 PM      Result Value Range Status   Specimen Description BLOOD LEFT HAND   Final   Special Requests BOTTLES DRAWN AEROBIC ONLY 2CC   Final   Culture  Setup Time     Final   Value: 08/06/2013 18:50     Performed at [facility]   Culture     Final   Value:        BLOOD CULTURE RECEIVED NO GROWTH TO DATE CULTURE WILL BE HELD FOR 5 DAYS BEFORE ISSUING A FINAL NEGATIVE REPORT     Performed at [facility]   Report Status PENDING   Incomplete  CULTURE, BLOOD (ROUTINE X 2)     Status: None   Collection Time    08/06/13  2:35 PM      Result Value Range Status   Specimen Description BLOOD LEFT HAND   Final   Special Requests BOTTLES DRAWN AEROBIC ONLY 1CC   Final   Culture  Setup Time     Final   Value: 08/06/2013 18:51     Performed at [facility]   Culture     Final   Value:        BLOOD CULTURE RECEIVED NO GROWTH TO DATE CULTURE WILL BE HELD FOR 5 DAYS  BEFORE ISSUING A FINAL NEGATIVE REPORT     Performed at [facility]   Report Status  PENDING   Incomplete     Studies: Dg Chest Port 1 View  08/06/2013   *RADIOLOGY REPORT*  Clinical Data: Shortness of breath.  End-stage renal disease - missed dialysis.  PORTABLE CHEST - 1 VIEW  Comparison: 08/06/2013  Findings: Cardiomegaly, CABG changes and left-sided AICD / pacemaker again noted. Pulmonary vascular congestion noted with bilateral pleural effusions and bibasilar atelectasis. Possible mild interstitial edema noted. No acute bony abnormalities are present. Remote rib fractures are identified.  IMPRESSION: Cardiomegaly, pulmonary vascular congestion and possible mild interstitial edema.  Bilateral pleural effusions and bibasilar atelectasis.   Original Report Authenticated By: Harmon Pier, M.D.    Scheduled Meds: . ipratropium  0.5 mg Nebulization QID   And  . albuterol  2.5 mg Nebulization QID  . aspirin EC  81 mg Oral Daily  . calcium acetate  1,334 mg Oral TID WC  . carvedilol  3.125 mg Oral BID WC  . darbepoetin  60 mcg Intravenous Q Wed-HD  . finasteride  5 mg Oral QPM  . gabapentin  300 mg Oral QHS  . insulin aspart  0-15 Units Subcutaneous Q4H  . levothyroxine  75 mcg Oral QHS  . meropenem (MERREM) IV  500 mg Intravenous Q24H  . midodrine  10 mg Oral TID WC  . multivitamin  1 tablet Oral QHS  . pantoprazole  40 mg Oral Daily  . vancomycin  750 mg Intravenous Q M,W,F-HD  . warfarin  3 mg Oral QPM  . Warfarin - Pharmacist Dosing Inpatient   Does not apply q1800   Continuous Infusions:   Principal Problem:   HCAP (healthcare-associated pneumonia) Active Problems:   ESRD on hemodialysis   DM2 (diabetes mellitus, type 2)   HTN (hypertension)   ICD (implantable cardioverter-defibrillator) in place   S/P AKA (above knee amputation)   CAD (coronary artery disease)   S/P CABG (coronary artery bypass graft)   Atrial fibrillation   Warfarin anticoagulation   Mental status change   Leukocytosis, unspecified   Dehydration   [facility]  [facility] Pager [phone]. If 7PM-7AM, please contact night-coverage at [email or web address], password [facility] 08/08/2013, 8:17 AM  LOS: 2 days

## 2013-08-08 NOTE — Progress Notes (Signed)
RT assessed patient and changed to Albuterol BID per patient request. This is what he takes at home. RT will continue to monitor.

## 2013-08-08 NOTE — Progress Notes (Signed)
[facility] ROUNDING NOTE   Subjective:   Interval History: resting comfortably no complaints  Objective:  Vital signs in last 24 hours:  Temp:  [97.6 F (36.4 C)-100.7 F (38.2 C)] 98.6 F (37 C) (09/03 0900) Pulse Rate:  [64-104] 64 (09/03 0900) Resp:  [15-18] 18 (09/03 0900) BP: (82-142)/(51-91) 107/91 mmHg (09/03 0900) SpO2:  [94 %-100 %] 96 % (09/03 0900) Weight:  [72.7 kg (160 lb 4.4 oz)-73.029 kg (161 lb)] 73.029 kg (161 lb) (09/02 2032)  Weight change: 0.837 kg (1 lb 13.5 oz) Filed Weights   08/07/13 0730 08/07/13 1130 08/07/13 2032  Weight: 75 kg (165 lb 5.5 oz) 72.7 kg (160 lb 4.4 oz) 73.029 kg (161 lb)    Intake/Output: I/O last 3 completed shifts: In: 410 [P.O.:360; IV Piggyback:50] Out: 2700 [Other:2700]   Intake/Output this shift:  Total I/O In: 120 [P.O.:120] Out: -   Awake Alert, Neck   Supple Neck,No JVD CVS RRR,No Gallops,Rubs  RS clear GI Abd Soft, Non tender, No organomegaly  No rebound Ext R AKA    Basic Metabolic Panel:  Recent Labs Lab 08/01/13 1100 08/06/13 1332 08/07/13 0750  NA 132* 137 136  K 4.7 3.8 3.7  CL 93* 96 95*  CO2 27 25 23   GLUCOSE 217* 203* 151*  BUN 45* 67* 74*  CREATININE 4.84* 6.72* 7.48*  CALCIUM 9.3 9.1 9.0  MG  --  2.1  --   PHOS  --   --  3.6    Liver Function Tests:  Recent Labs Lab 08/06/13 1332 08/07/13 0750  AST 11  --   ALT 12  --   ALKPHOS 83  --   BILITOT 0.2*  --   PROT 6.2  --   ALBUMIN 2.5* 2.5*   No results found for this basename: LIPASE, AMYLASE,  in the last 168 hours No results found for this basename: AMMONIA,  in the last 168 hours  CBC:  Recent Labs Lab 08/01/13 1143 08/06/13 1332 08/07/13 0758 08/08/13 0830  WBC 7.1 17.3* 7.8 6.5  NEUTROABS  --  15.8*  --   --   HGB 8.8* 9.1* 9.3* 10.6*  HCT 28.6* 28.9* 29.5* 33.9*  MCV 105.1* 103.2* 102.8* 104.3*  PLT 200 293 244 282    Cardiac Enzymes:  Recent Labs Lab 08/06/13 1332 08/07/13 1355   TROPONINI <0.30 <0.30    BNP: No components found with this basename: POCBNP,   CBG:  Recent Labs Lab 08/07/13 1714 08/07/13 2013 08/08/13 0035 08/08/13 0418 08/08/13 0812  GLUCAP 210* 134* 112* 133* 105*    Microbiology: Results for orders placed during the hospital encounter of 08/06/13  CULTURE, BLOOD (ROUTINE X 2)     Status: None   Collection Time    08/06/13  2:23 PM      Result Value Range Status   Specimen Description BLOOD LEFT HAND   Final   Special Requests BOTTLES DRAWN AEROBIC ONLY 2CC   Final   Culture  Setup Time     Final   Value: 08/06/2013 18:50     Performed at [facility]   Culture     Final   Value:        BLOOD CULTURE RECEIVED NO GROWTH TO DATE CULTURE WILL BE HELD FOR 5 DAYS BEFORE ISSUING A FINAL NEGATIVE REPORT     Performed at [facility]   Report Status PENDING   Incomplete  CULTURE, BLOOD (ROUTINE X 2)  Status: None   Collection Time    08/06/13  2:35 PM      Result Value Range Status   Specimen Description BLOOD LEFT HAND   Final   Special Requests BOTTLES DRAWN AEROBIC ONLY 1CC   Final   Culture  Setup Time     Final   Value: 08/06/2013 18:51     Performed at [facility]   Culture     Final   Value:        BLOOD CULTURE RECEIVED NO GROWTH TO DATE CULTURE WILL BE HELD FOR 5 DAYS BEFORE ISSUING A FINAL NEGATIVE REPORT     Performed at [facility]   Report Status PENDING   Incomplete    Coagulation Studies:  Recent Labs  08/06/13 1332 08/07/13 1400 08/08/13 0830  LABPROT 26.5* 25.4* 27.5*  INR 2.54* 2.40* 2.67*    Urinalysis: No results found for this basename: COLORURINE, APPERANCEUR, LABSPEC, PHURINE, GLUCOSEU, HGBUR, BILIRUBINUR, KETONESUR, PROTEINUR, UROBILINOGEN, NITRITE, LEUKOCYTESUR,  in the last 72 hours    Imaging: Dg Chest 2 View  08/08/2013   CLINICAL DATA:  Pneumonia, shortness of breath.  EXAM: CHEST  2 VIEW  COMPARISON:  08/06/2013  FINDINGS: Left AICD remains in place,  unchanged. Prior CABG. Low lung volumes. Patchy bilateral opacities could reflect edema or infection. Suspect small bilateral effusions. No acute bony abnormality.  IMPRESSION: Continued low lung volumes. Patchy bilateral opacities which could reflect edema or infection. Small effusions.   Electronically Signed   By: Charlett Nose   On: 08/08/2013 10:26   Dg Chest Port 1 View  08/06/2013   *RADIOLOGY REPORT*  Clinical Data: Shortness of breath.  End-stage renal disease - missed dialysis.  PORTABLE CHEST - 1 VIEW  Comparison: 08/06/2013  Findings: Cardiomegaly, CABG changes and left-sided AICD / pacemaker again noted. Pulmonary vascular congestion noted with bilateral pleural effusions and bibasilar atelectasis. Possible mild interstitial edema noted. No acute bony abnormalities are present. Remote rib fractures are identified.  IMPRESSION: Cardiomegaly, pulmonary vascular congestion and possible mild interstitial edema.  Bilateral pleural effusions and bibasilar atelectasis.   Original Report Authenticated By: Harmon Pier, M.D.     Medications:     . ipratropium  0.5 mg Nebulization QID   And  . albuterol  2.5 mg Nebulization QID  . aspirin EC  81 mg Oral Daily  . calcium acetate  1,334 mg Oral TID WC  . carvedilol  3.125 mg Oral BID WC  . darbepoetin  60 mcg Intravenous Q Wed-HD  . finasteride  5 mg Oral QPM  . gabapentin  300 mg Oral QHS  . insulin aspart  0-15 Units Subcutaneous Q4H  . levothyroxine  75 mcg Oral QHS  . meropenem (MERREM) IV  500 mg Intravenous Q24H  . midodrine  10 mg Oral TID WC  . multivitamin  1 tablet Oral QHS  . pantoprazole  40 mg Oral Daily  . vancomycin  750 mg Intravenous Q M,[name],F-HD  . warfarin  3 mg Oral QPM  . Warfarin - Pharmacist Dosing Inpatient   Does not apply q1800   acetaminophen, acetaminophen, bisacodyl, guaiFENesin-dextromethorphan, nitroGLYCERIN, ondansetron (ZOFRAN) IV  Assessment/ Plan:   ESRD- Usually MWF but off schedule due to shock and  hypotension  ANEMIA- Stable  MBD- Stable  HTN/VOL- no evidence of volume overload  ACCESS- AVF good thrill and bruit  Plan dialysis tomorrow off schedule   LOS: 2 [name] [name] @TODAY @10 :35 AM

## 2013-08-08 NOTE — Progress Notes (Signed)
Utilization review completed.  

## 2013-08-08 NOTE — Progress Notes (Signed)
ANTIBIOTIC CONSULT NOTE - FOLLOW UP  Pharmacy Consult for Vancomycin + Warfarin  Indication: Empiric HCAP coverage and hx Afib  Allergies  Allergen Reactions  . Morphine And Related Other (See Comments)    "makes me hallucinate"  . Penicillins Rash  . Nsaids Other (See Comments)    "don't really know"  . Other Other (See Comments)    Allergic to cox2 inhibitors per [facility]    Patient Measurements: Height: 5\' 7"  (170.2 cm) Weight: 161 lb (73.029 kg) IBW/kg (Calculated) : 66.1  Vital Signs: Temp: 98.6 F (37 C) (09/03 0900) Temp src: Oral (09/03 0900) BP: 107/91 mmHg (09/03 0900) Pulse Rate: 64 (09/03 0900) Intake/Output from previous day: 09/02 0701 - 09/03 0700 In: 410 [P.O.:360; IV Piggyback:50] Out: 2700  Intake/Output from this shift: Total I/O In: 120 [P.O.:120] Out: -   Labs:  Recent Labs  08/06/13 1332 08/07/13 0750 08/07/13 0758 08/08/13 0830  WBC 17.3*  --  7.8 6.5  HGB 9.1*  --  9.3* 10.6*  PLT 293  --  244 282  CREATININE 6.72* 7.48*  --   --    Estimated Creatinine Clearance: 7.7 ml/min (by C-G formula based on Cr of 7.48). No results found for this basename: VANCOTROUGH, Leodis Binet, VANCORANDOM, GENTTROUGH, GENTPEAK, GENTRANDOM, TOBRATROUGH, TOBRAPEAK, TOBRARND, AMIKACINPEAK, AMIKACINTROU, AMIKACIN,  in the last 72 hours   Microbiology: Recent Results (from the past 720 hour(s))  MRSA PCR SCREENING     Status: None   Collection Time    07/30/13 12:07 AM      Result Value Range Status   MRSA by PCR NEGATIVE  NEGATIVE Final   Comment:            The GeneXpert MRSA Assay (FDA     approved for NASAL specimens     only), is one component of a     comprehensive MRSA colonization     surveillance program. It is not     intended to diagnose MRSA     infection nor to guide or     monitor treatment for     MRSA infections.  URINE CULTURE     Status: None   Collection Time    07/30/13 12:43 AM      Result Value Range Status   Specimen Description  URINE, RANDOM   Final   Special Requests ADDED 0222   Final   Culture  Setup Time     Final   Value: 07/30/2013 01:57     Performed at [facility] Count     Final   Value: >=100,000 COLONIES/ML     Performed at [facility]   Culture     Final   Value: ESCHERICHIA COLI     Note: Confirmed Extended Spectrum Beta-Lactamase Producer (ESBL) CRITICAL RESULT CALLED TO, READ BACK BY AND VERIFIED WITH: ALBERT PERIO @ 7:43AM  08/02/13 BY DWEEKS FOSFOMYCIN S 0.75ug/mL     PROTEUS MIRABILIS     Note: FOSFOMYCIN 0.50ug/mL     Performed at [facility]   Report Status 08/03/2013 FINAL   Final   Organism ID, Bacteria PROTEUS MIRABILIS   Final   Organism ID, Bacteria ESCHERICHIA COLI   Final   Organism ID, Bacteria PROTEUS MIRABILIS   Final  CULTURE, BLOOD (ROUTINE X 2)     Status: None   Collection Time    08/06/13  2:23 PM      Result Value Range Status   Specimen Description BLOOD  LEFT HAND   Final   Special Requests BOTTLES DRAWN AEROBIC ONLY 2CC   Final   Culture  Setup Time     Final   Value: 08/06/2013 18:50     Performed at [facility]   Culture     Final   Value:        BLOOD CULTURE RECEIVED NO GROWTH TO DATE CULTURE WILL BE HELD FOR 5 DAYS BEFORE ISSUING A FINAL NEGATIVE REPORT     Performed at [facility]   Report Status PENDING   Incomplete  CULTURE, BLOOD (ROUTINE X 2)     Status: None   Collection Time    08/06/13  2:35 PM      Result Value Range Status   Specimen Description BLOOD LEFT HAND   Final   Special Requests BOTTLES DRAWN AEROBIC ONLY 1CC   Final   Culture  Setup Time     Final   Value: 08/06/2013 18:51     Performed at [facility]   Culture     Final   Value:        BLOOD CULTURE RECEIVED NO GROWTH TO DATE CULTURE WILL BE HELD FOR 5 DAYS BEFORE ISSUING A FINAL NEGATIVE REPORT     Performed at [facility]   Report Status PENDING   Incomplete    Anti-infectives   Start     Dose/Rate Route  Frequency Ordered Stop   08/08/13 1200  vancomycin (VANCOCIN) IVPB 750 mg/150 ml premix  Status:  Discontinued     750 mg 150 mL/hr over 60 Minutes Intravenous Every M-W-F (Hemodialysis) 08/07/13 1551 08/08/13 1257   08/07/13 2000  meropenem (MERREM) 500 mg in sodium chloride 0.9 % 50 mL IVPB     500 mg 100 mL/hr over 30 Minutes Intravenous Every 24 hours 08/07/13 0959     08/07/13 1200  vancomycin (VANCOCIN) IVPB 750 mg/150 ml premix     750 mg 150 mL/hr over 60 Minutes Intravenous Every Tue (Hemodialysis) 08/07/13 0830 08/07/13 1123   08/06/13 1600  vancomycin (VANCOCIN) 1,250 mg in sodium chloride 0.9 % 250 mL IVPB     1,250 mg 166.7 mL/hr over 90 Minutes Intravenous  Once 08/06/13 1511 08/06/13 1842   08/06/13 1600  ertapenem (INVANZ) 0.5 g in sodium chloride 0.9 % 50 mL IVPB  Status:  Discontinued     500 mg 100 mL/hr over 30 Minutes Intravenous Every 24 hours 08/06/13 1531 08/07/13 0957   08/06/13 1500  clindamycin (CLEOCIN) IVPB 600 mg  Status:  Discontinued     600 mg 100 mL/hr over 30 Minutes Intravenous 3 times per day 08/06/13 1429 08/06/13 1531      Assessment: 79 y.o. M who presented with SOB and fatigue on 9/1 and was started on empiric antibiotics for rule out HCAP. The patient was switched from Ertapenem to Meropenem today for gram negative coverage, and continues on Vancomycin per Rx. The patient was loaded with Vancomycin 1250 mg on 9/1 and given a maintenance dose post HD off-schedule on 9/2. The patient is noted to be staying off-schedule, with the next HD session scheduled for 9/4. The patient tolerated his last session poorly, so will f/u tolerance on 9/4 prior to entering the patient's Vancomycin dose.   The patient also was continued on home warfarin for hx Afib. INR today is therapeutic (INR 2.67 << 2.4, goal of 2-3). Hgb/Hct/Plt stable. No overt s/x of bleeding noted.   Goal of Therapy:  Pre-HD  Vancomycin level of 15-25 mcg/ml INR 2-3  Plan:  1. No additional  Vancomycin for now -- will f/u tolerance on 9/4 prior to entering Vancomycin dose.  2. Continue Warfarin 3 mg daily 3. Will continue to follow renal function, culture results, LOT, and antibiotic de-escalation plans  4. Will continue to monitor for any signs/symptoms of bleeding and will follow up with PT/INR in the a.m.   Georgina Pillion, PharmD, BCPS Clinical Pharmacist Pager: [phone] 08/08/2013 1:05 PM
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
## 2013-08-09 DIAGNOSIS — J189 Pneumonia, unspecified organism: Principal | ICD-10-CM

## 2013-08-09 LAB — RENAL FUNCTION PANEL
Albumin: 2.6 g/dL — ABNORMAL LOW (ref 3.5–5.2)
CO2: 26 mEq/L (ref 19–32)
Chloride: 92 mEq/L — ABNORMAL LOW (ref 96–112)
Creatinine, Ser: 5.59 mg/dL — ABNORMAL HIGH (ref 0.50–1.35)
GFR calc non Af Amer: 9 mL/min — ABNORMAL LOW (ref >90)
Potassium: 3.7 mEq/L (ref 3.5–5.1)

## 2013-08-09 LAB — CBC
HCT: 32.2 % — ABNORMAL LOW (ref 39.0–52.0)
MCV: 101.9 fL — ABNORMAL HIGH (ref 78.0–100.0)
Platelets: 304 10*3/uL (ref 150–400)
RBC: 3.16 MIL/uL — ABNORMAL LOW (ref 4.22–5.81)
RDW: 15.6 % — ABNORMAL HIGH (ref 11.5–15.5)
WBC: 8.6 10*3/uL (ref 4.0–10.5)

## 2013-08-09 LAB — PROTIME-INR: INR: 3.39 — ABNORMAL HIGH (ref 0.00–1.49)

## 2013-08-09 LAB — GLUCOSE, CAPILLARY
Glucose-Capillary: 121 mg/dL — ABNORMAL HIGH (ref 70–99)
Glucose-Capillary: 148 mg/dL — ABNORMAL HIGH (ref 70–99)

## 2013-08-09 MED ORDER — SODIUM CHLORIDE 0.9 % IV SOLN: 100.0000 mL | INTRAVENOUS | Status: DC | PRN

## 2013-08-09 MED ORDER — ALBUTEROL SULFATE (5 MG/ML) 0.5% IN NEBU
INHALATION_SOLUTION | RESPIRATORY_TRACT | Status: AC
  Administered 2013-08-09: 2.5 mg
  Filled 2013-08-09: qty 0.5

## 2013-08-09 MED ORDER — DARBEPOETIN ALFA-POLYSORBATE 60 MCG/0.3ML IJ SOLN
INTRAMUSCULAR | Status: AC
  Administered 2013-08-09: 60 ug via INTRAVENOUS
  Filled 2013-08-09: qty 0.3

## 2013-08-09 MED ORDER — NEPRO/CARBSTEADY PO LIQD: 237.0000 mL | ORAL | Status: DC | PRN

## 2013-08-09 MED ORDER — DARBEPOETIN ALFA-POLYSORBATE 60 MCG/0.3ML IJ SOLN
60.0000 ug | INTRAMUSCULAR | Status: DC
  Administered 2013-08-11: 60 ug via INTRAVENOUS

## 2013-08-09 MED ORDER — ALTEPLASE 2 MG IJ SOLR
2.0000 mg | Freq: Once | INTRAMUSCULAR | Status: DC | PRN
  Filled 2013-08-09: qty 2

## 2013-08-09 MED ORDER — VANCOMYCIN HCL IN DEXTROSE 750-5 MG/150ML-% IV SOLN
750.0000 mg | INTRAVENOUS | Status: AC
  Administered 2013-08-09: 750 mg via INTRAVENOUS
  Filled 2013-08-09: qty 150

## 2013-08-09 MED ORDER — LIDOCAINE HCL (PF) 1 % IJ SOLN: 5.0000 mL | INTRAMUSCULAR | Status: DC | PRN

## 2013-08-09 MED ORDER — PENTAFLUOROPROP-TETRAFLUOROETH EX AERO: 1.0000 "application " | INHALATION_SPRAY | CUTANEOUS | Status: DC | PRN

## 2013-08-09 MED ORDER — HEPARIN SODIUM (PORCINE) 1000 UNIT/ML DIALYSIS: 1000.0000 [IU] | INTRAMUSCULAR | Status: DC | PRN

## 2013-08-09 MED ORDER — IPRATROPIUM BROMIDE 0.02 % IN SOLN
RESPIRATORY_TRACT | Status: AC
  Administered 2013-08-09: 13:00:00 0.5 mg
  Filled 2013-08-09: qty 2.5

## 2013-08-09 MED ORDER — INSULIN ASPART 100 UNIT/ML ~~LOC~~ SOLN
0.0000 [IU] | Freq: Three times a day (TID) | SUBCUTANEOUS | Status: DC
  Administered 2013-08-09: 3 [IU] via SUBCUTANEOUS
  Administered 2013-08-09: 2 [IU] via SUBCUTANEOUS
  Administered 2013-08-10 (×2): 3 [IU] via SUBCUTANEOUS
  Administered 2013-08-10: 13:00:00 5 [IU] via SUBCUTANEOUS
  Administered 2013-08-11: 3 [IU] via SUBCUTANEOUS
  Administered 2013-08-11: 2 [IU] via SUBCUTANEOUS

## 2013-08-09 MED ORDER — LIDOCAINE-PRILOCAINE 2.5-2.5 % EX CREA: 1.0000 "application " | TOPICAL_CREAM | CUTANEOUS | Status: DC | PRN

## 2013-08-09 NOTE — Evaluation (Signed)
Physical Therapy Evaluation Patient Details Name: [name] [name] MRN: [id number] DOB: Feb 18, 1934 Today's Date: 08/09/2013 Time: 7829-5621 PT Time Calculation (min): 24 min  PT Assessment / Plan / Recommendation History of Present Illness  79 y.o. male admitted to [facility] on 08/06/13 with  PMHx DM type II, HTN,   ESRD on HD M/W/F (who did not get his HD today), CAD/ischemic cardiomyopathy, S/P CABG,, AICD, paroxysmal A. fib on Coumadin, COPD resident of assisted-living facility recently discharged on 08/02/2013 after being treated for respiratory failure/renal failure/pneumonia.  presented to the [facility] ER today when she was seen by Dr. Merrilee Seashore. Per Dr. Mellody Dance patient stated did not receive his HD today secondary to having a fever of 104 on presentation however initial temp at the ED was within normal limits. However patient did have leukocytosis (14), 3% bands, BNP 55,000, SOB and positive troponin.  Upon presentation, patient was lethargic mildly confused but would answer questions  Clinical Impression  Pt seems to be close to his mobility baseline which was assist with bed mobility and transfers.  Pt reports he was active with PT at his ALF (they have their own therapy there) and would like to resume therapy when he goes back.  As long as ALF will take him back at his current mobility status I think he should resume PT there.  Until then, PT will follow here acutely.    PT Assessment  Patient needs continued PT services    Follow Up Recommendations  Home health PT;Other (comment) (at ALF)    Does the patient have the potential to tolerate intense rehabilitation     NA  Barriers to Discharge  (None) None    Equipment Recommendations  None recommended by PT    Recommendations for Other Services   None  Frequency Min 3X/week    Precautions / Restrictions Precautions Precautions: Fall Precaution Comments: recent R AKA   Pertinent Vitals/Pain See vitals flow sheet.       Mobility  Bed Mobility Bed Mobility: Supine to Sit;Sitting - Scoot to Edge of Bed Supine to Sit: 2: Max assist;With rails;HOB elevated Sitting - Scoot to [place] of Bed: 2: Max assist;With rail Details for Bed Mobility Assistance: max assist to support trunk to get to sitting.  Pt unable to use railing on bed to get himself to sitting.   Transfers Transfers: Lateral/Scoot Transfers Lateral/Scoot Transfers: 3: Mod assist;With slide board Details for Transfer Assistance: mod assist to help support trunk and move hips across slide board to his right into the drop arm recliner from elevated bed.  Pt assisting with transfer with his arms.   Ambulation/Gait Ambulation/Gait Assistance: Not tested (comment) (pt has been non ambulatory since AKA)        PT Diagnosis: Difficulty walking;Abnormality of gait;Generalized weakness  PT Problem List: Decreased strength;Decreased activity tolerance;Decreased balance;Decreased mobility;Obesity PT Treatment Interventions: DME instruction;Functional mobility training;Therapeutic exercise;Therapeutic activities;Balance training;Neuromuscular re-education;Patient/family education;Wheelchair mobility training     PT Goals(Current goals can be found in the care plan section) Acute Rehab PT Goals Patient Stated Goal: to go back to ALF PT Goal Formulation: With patient Time For Goal Achievement: 08/23/13 Potential to Achieve Goals: Good  Visit Information  Last PT Received On: 08/09/13 Assistance Needed: +1 History of Present Illness: 79 y.o. male admitted to [facility] on 08/06/13 with  PMHx DM type II, HTN,   ESRD on HD M/W/F (who did not get his HD today), CAD/ischemic cardiomyopathy, S/P CABG,, AICD, paroxysmal A. fib on Coumadin, COPD resident of  assisted-living facility recently discharged on 08/02/2013 after being treated for respiratory failure/renal failure/pneumonia.  presented to the [facility] ER today when she was seen by Dr. Merrilee Seashore. Per Dr. Mellody Dance patient stated did not  receive his HD today secondary to having a fever of 104 on presentation however initial temp at the ED was within normal limits. However patient did have leukocytosis (14), 3% bands, BNP 55,000, SOB and positive troponin.  Upon presentation, patient was lethargic mildly confused but would answer questions       Prior Functioning  Home Living Family/patient expects to be discharged to:: Assisted living Home Equipment: Wheelchair - manual;Other (comment) (slide board) Prior Function Level of Independence: Needs assistance Gait / Transfers Assistance Needed: needed assist for slide board transfers into and out of WC as well as bed mobility.   Comments: Per pt report his righ prosthetic leg doesn't fit and they are working on getting the prosthetist back to adjust it.  He was actively participating in PT at ALF.   Communication Communication: No difficulties    Cognition  Cognition Arousal/Alertness: Awake/alert Behavior During Therapy: WFL for tasks assessed/performed Overall Cognitive Status: Within Functional Limits for tasks assessed (not specifically tested)    Extremity/Trunk Assessment Upper Extremity Assessment Upper Extremity Assessment: Overall WFL for tasks assessed Lower Extremity Assessment Lower Extremity Assessment: RLE deficits/detail;LLE deficits/detail RLE Deficits / Details: R AKA (within the past 6 months) with good flexion ROM, can lift against gravity.  Did not check abduction or ext.   LLE Deficits / Details: generalized wekness Cervical / Trunk Assessment Cervical / Trunk Assessment: Normal   Balance Balance Balance Assessed: Yes Static Sitting Balance Static Sitting - Balance Support: Bilateral upper extremity supported;Feet supported (left foot supported) Static Sitting - Level of Assistance: 5: Stand by assistance  End of Session PT - End of Session Equipment Utilized During Treatment: Other (comment) (slide board) Activity Tolerance: Patient limited by  fatigue Patient left: in chair;with call bell/phone within reach Nurse Communication: Mobility status (to Hydrologist)       Lurena Joiner B. Hoyte Ziebell, PT, DPT [phone]   08/09/2013, 5:33 PM

## 2013-08-09 NOTE — Progress Notes (Signed)
[facility] PROGRESS NOTE  [name] [name] AVW:[id number] DOB: [birth year] DOA: 08/06/2013 PCP: [name], MD  Assessment/Plan: 1.HCAP - improving now with aggressive treatment,- admitted with productive cough and was recently hospitalized, mental status was poor upon admission, question slight aspiration, he has responded very well to empiric antibiotics, appreciate pharmacy assistance,  hemodynamically stable and tolerating dialysis, will continue aspiration precautions, feeding assistance and will have PT evaluate the patient. Awaiting MBS study results. Monitor blood cultures.   2. ESRD; patient on HD M/W/F, renal has seen the patient he's been dialyzed 08-2013 as he missed dialysis on Monday and he had HD today.   3. Atrial fib; continue patient on Coumadin (pharmacy to manage). Also on low-dose aspirin which will be continued, he has chronic hypotension and cannot tolerate beta blocker/calcium channel blocker.   4. CABG/ICD in implant/history NSTEMI; - chest pain-free, first set troponin negative, had some nonspecific EKG changes upon admission and the admitting physician had consulted Dr. Thurmon Fair (Cardiology) , continue low-dose aspirin and monitor clinically. He's not a candidate for invasive procedures.   5. DM type II; patient placed on moderate SSI for control of glucose . Blood sugars well controlled.   Code Status: DNR Disposition:  Return to Assisted Living when medically stable  HPI/Subjective: Pt is reporting that he is feeling much better.  He is in no distress.  No SOB or CP.  He would like to return to assisted living facility soon.  He had HD today.    Objective: Filed Vitals:   08/09/13 1219  BP: 122/78  Pulse: 68  Temp: 97 F (36.1 C)  Resp: 20    Intake/Output Summary (Last 24 hours) at 08/09/13 1456 Last data filed at 08/09/13 1110  Gross per 24 hour  Intake    170 ml  Output   1666 ml  Net  -1496 ml   Filed Weights   08/07/13 2032 08/08/13  2009 08/09/13 0659  Weight: 73.029 kg (161 lb) 72.2 kg (159 lb 2.8 oz) 73.2 kg (161 lb 6 oz)    Exam:  General: Awake, alert, no distress, cooperative  Cardiovascular: irreg, normal s1, s2 sounds  Respiratory: BBS no crackles or rales heard  Abdomen: soft, nondistended, no masses palpated  Musculoskeletal: no pretibial edema  Data Reviewed: Basic Metabolic Panel:  Recent Labs Lab 08/06/13 1332 08/07/13 0750 08/09/13 0716  NA 137 136 133*  K 3.8 3.7 3.7  CL 96 95* 92*  CO2 25 23 26   GLUCOSE 203* 151* 132*  BUN 67* 74* 45*  CREATININE 6.72* 7.48* 5.59*  CALCIUM 9.1 9.0 9.2  MG 2.1  --   --   PHOS  --  3.6 4.1   Liver Function Tests:  Recent Labs Lab 08/06/13 1332 08/07/13 0750 08/09/13 0716  AST 11  --   --   ALT 12  --   --   ALKPHOS 83  --   --   BILITOT 0.2*  --   --   PROT 6.2  --   --   ALBUMIN 2.5* 2.5* 2.6*   No results found for this basename: LIPASE, AMYLASE,  in the last 168 hours No results found for this basename: AMMONIA,  in the last 168 hours CBC:  Recent Labs Lab 08/06/13 1332 08/07/13 0758 08/08/13 0830 08/09/13 0716  WBC 17.3* 7.8 6.5 8.6  NEUTROABS 15.8*  --   --   --   HGB 9.1* 9.3* 10.6* 10.2*  HCT 28.9* 29.5* 33.9* 32.2*  MCV  103.2* 102.8* 104.3* 101.9*  PLT 293 244 282 304   Cardiac Enzymes:  Recent Labs Lab 08/06/13 1332 08/07/13 1355  TROPONINI <0.30 <0.30   BNP (last 3 results) No results found for this basename: PROBNP,  in the last 8760 hours CBG:  Recent Labs Lab 08/08/13 1652 08/08/13 2004 08/09/13 0052 08/09/13 0416 08/09/13 1213  GLUCAP 189* 155* 101* 121* 148*    Recent Results (from the past 240 hour(s))  CULTURE, BLOOD (ROUTINE X 2)     Status: None   Collection Time    08/06/13  2:23 PM      Result Value Range Status   Specimen Description BLOOD LEFT HAND   Final   Special Requests BOTTLES DRAWN AEROBIC ONLY 2CC   Final   Culture  Setup Time     Final   Value: 08/06/2013 18:50     Performed  at [facility]   Culture     Final   Value:        BLOOD CULTURE RECEIVED NO GROWTH TO DATE CULTURE WILL BE HELD FOR 5 DAYS BEFORE ISSUING A FINAL NEGATIVE REPORT     Performed at [facility]   Report Status PENDING   Incomplete  CULTURE, BLOOD (ROUTINE X 2)     Status: None   Collection Time    08/06/13  2:35 PM      Result Value Range Status   Specimen Description BLOOD LEFT HAND   Final   Special Requests BOTTLES DRAWN AEROBIC ONLY 1CC   Final   Culture  Setup Time     Final   Value: 08/06/2013 18:51     Performed at [facility]   Culture     Final   Value:        BLOOD CULTURE RECEIVED NO GROWTH TO DATE CULTURE WILL BE HELD FOR 5 DAYS BEFORE ISSUING A FINAL NEGATIVE REPORT     Performed at [facility]   Report Status PENDING   Incomplete     Studies: Dg Chest 2 View  08/08/2013   CLINICAL DATA:  Pneumonia, shortness of breath.  EXAM: CHEST  2 VIEW  COMPARISON:  08/06/2013  FINDINGS: Left AICD remains in place, unchanged. Prior CABG. Low lung volumes. Patchy bilateral opacities could reflect edema or infection. Suspect small bilateral effusions. No acute bony abnormality.  IMPRESSION: Continued low lung volumes. Patchy bilateral opacities which could reflect edema or infection. Small effusions.   Electronically Signed   By: Charlett Nose   On: 08/08/2013 10:26    Scheduled Meds: . albuterol  2.5 mg Nebulization BID   And  . ipratropium  0.5 mg Nebulization BID  . aspirin EC  81 mg Oral Daily  . calcium acetate  1,334 mg Oral TID WC  . carvedilol  3.125 mg Oral BID WC  . [START ON 08/17/2013] darbepoetin  60 mcg Intravenous Q Fri-HD  . finasteride  5 mg Oral QPM  . gabapentin  300 mg Oral QHS  . insulin aspart  0-15 Units Subcutaneous TID WC  . levothyroxine  75 mcg Oral QAC breakfast  . meropenem (MERREM) IV  500 mg Intravenous Q24H  . midodrine  10 mg Oral TID WC  . multivitamin  1 tablet Oral QHS  . pantoprazole  40 mg Oral Daily  .  Warfarin - Pharmacist Dosing Inpatient   Does not apply q1800   Continuous Infusions:   Principal Problem:   HCAP (healthcare-associated pneumonia) Active Problems:  ESRD on hemodialysis   DM2 (diabetes mellitus, type 2)   HTN (hypertension)   ICD (implantable cardioverter-defibrillator) in place   S/P AKA (above knee amputation)   CAD (coronary artery disease)   S/P CABG (coronary artery bypass graft)   Atrial fibrillation   Warfarin anticoagulation   Mental status change   Leukocytosis, unspecified   Dehydration   [facility]  [facility] Pager [phone]. If 7PM-7AM, please contact night-coverage at [email or web address], password [facility] 08/09/2013, 2:56 PM  LOS: 3 days

## 2013-08-09 NOTE — Progress Notes (Signed)
Speech Language Pathology Dysphagia Treatment Patient Details Name: [name] [name] MRN: [id number] DOB: 1934-02-29 Today's Date: 08/09/2013 Time: 0347-4259 SLP Time Calculation (min): 17 min  Assessment / Plan / Recommendation Clinical Impression  F/u diet tolerance assessment revealed no overt s/s of aspiration with po intake, being self fed, with supervision cues for use of compensatory strategies and aspiration precautions. Patient able to verbalize understanding of precautions independently by end of session after min fading clinician cueing. Overall, appears to be tolerating current diet. No further skilled SLP needs indicated at this time. SLP signing off. Please reconsult if needed.     Diet Recommendation  Continue with Current Diet: Regular;Thin liquid    SLP Plan All goals met   Pertinent Vitals/Pain None reported   Swallowing Goals  SLP Swallowing Goals Patient will utilize recommended strategies during swallow to increase swallowing safety with: Supervision/safety Swallow Study Goal #2 - Progress: Met  General Temperature Spikes Noted: No Respiratory Status: Room air Behavior/Cognition: Alert;Cooperative;Pleasant mood Oral Cavity - Dentition: Dentures, bottom;Dentures, top Patient Positioning: Upright in bed      Dysphagia Treatment Treatment focused on: Skilled observation of diet tolerance;Patient/family/caregiver education;Utilization of compensatory strategies Treatment Methods/Modalities: Skilled observation Patient observed directly with PO's: Yes Type of PO's observed: Regular;Thin liquids Feeding: Able to feed self Liquids provided via: Straw Type of cueing: Verbal Amount of cueing:  (supervision)   GO   Ferdinand Lango MA, CCC-SLP [phone]   Denario Bagot Meryl 08/09/2013, 3:08 PM

## 2013-08-09 NOTE — Progress Notes (Signed)
I have seen and examined this patient and agree with the plan of care . No complaints no SOB BP 85/55 goal 2-3L  [name] [name] W 08/09/2013, 7:52 AM

## 2013-08-09 NOTE — Progress Notes (Signed)
ANTIBIOTIC CONSULT NOTE - FOLLOW UP  Pharmacy Consult for Vancomycin + Warfarin  Indication: Empiric HCAP coverage and hx Afib  Allergies  Allergen Reactions  . Morphine And Related Other (See Comments)    "makes me hallucinate"  . Penicillins Rash  . Nsaids Other (See Comments)    "don't really know"  . Other Other (See Comments)    Allergic to cox2 inhibitors per [facility]    Patient Measurements: Height: 5\' 7"  (170.2 cm) Weight: 161 lb 6 oz (73.2 kg) IBW/kg (Calculated) : 66.1  Vital Signs: Temp: 97.9 F (36.6 C) (09/04 0659) Temp src: Oral (09/04 0659) BP: 80/49 mmHg (09/04 0942) Pulse Rate: 97 (09/04 0942) Intake/Output from previous day: 09/03 0701 - 09/04 0700 In: 290 [P.O.:240; IV Piggyback:50] Out: -  Intake/Output from this shift:    Labs:  Recent Labs  08/06/13 1332 08/07/13 0750 08/07/13 0758 08/08/13 0830 08/09/13 0716  WBC 17.3*  --  7.8 6.5 8.6  HGB 9.1*  --  9.3* 10.6* 10.2*  PLT 293  --  244 282 304  CREATININE 6.72* 7.48*  --   --  5.59*   Estimated Creatinine Clearance: 10.3 ml/min (by C-G formula based on Cr of 5.59). No results found for this basename: VANCOTROUGH, Leodis Binet, VANCORANDOM, GENTTROUGH, GENTPEAK, GENTRANDOM, TOBRATROUGH, TOBRAPEAK, TOBRARND, AMIKACINPEAK, AMIKACINTROU, AMIKACIN,  in the last 72 hours   Microbiology: Recent Results (from the past 720 hour(s))  MRSA PCR SCREENING     Status: None   Collection Time    07/30/13 12:07 AM      Result Value Range Status   MRSA by PCR NEGATIVE  NEGATIVE Final   Comment:            The GeneXpert MRSA Assay (FDA     approved for NASAL specimens     only), is one component of a     comprehensive MRSA colonization     surveillance program. It is not     intended to diagnose MRSA     infection nor to guide or     monitor treatment for     MRSA infections.  URINE CULTURE     Status: None   Collection Time    07/30/13 12:43 AM      Result Value Range Status   Specimen Description  URINE, RANDOM   Final   Special Requests ADDED 0222   Final   Culture  Setup Time     Final   Value: 07/30/2013 01:57     Performed at [facility] Count     Final   Value: >=100,000 COLONIES/ML     Performed at [facility]   Culture     Final   Value: ESCHERICHIA COLI     Note: Confirmed Extended Spectrum Beta-Lactamase Producer (ESBL) CRITICAL RESULT CALLED TO, READ BACK BY AND VERIFIED WITH: ALBERT PERIO @ 7:43AM  08/02/13 BY DWEEKS FOSFOMYCIN S 0.75ug/mL     PROTEUS MIRABILIS     Note: FOSFOMYCIN 0.50ug/mL     Performed at [facility]   Report Status 08/03/2013 FINAL   Final   Organism ID, Bacteria PROTEUS MIRABILIS   Final   Organism ID, Bacteria ESCHERICHIA COLI   Final   Organism ID, Bacteria PROTEUS MIRABILIS   Final  CULTURE, BLOOD (ROUTINE X 2)     Status: None   Collection Time    08/06/13  2:23 PM      Result Value Range Status   Specimen  Description BLOOD LEFT HAND   Final   Special Requests BOTTLES DRAWN AEROBIC ONLY 2CC   Final   Culture  Setup Time     Final   Value: 08/06/2013 18:50     Performed at [facility]   Culture     Final   Value:        BLOOD CULTURE RECEIVED NO GROWTH TO DATE CULTURE WILL BE HELD FOR 5 DAYS BEFORE ISSUING A FINAL NEGATIVE REPORT     Performed at [facility]   Report Status PENDING   Incomplete  CULTURE, BLOOD (ROUTINE X 2)     Status: None   Collection Time    08/06/13  2:35 PM      Result Value Range Status   Specimen Description BLOOD LEFT HAND   Final   Special Requests BOTTLES DRAWN AEROBIC ONLY 1CC   Final   Culture  Setup Time     Final   Value: 08/06/2013 18:51     Performed at [facility]   Culture     Final   Value:        BLOOD CULTURE RECEIVED NO GROWTH TO DATE CULTURE WILL BE HELD FOR 5 DAYS BEFORE ISSUING A FINAL NEGATIVE REPORT     Performed at [facility]   Report Status PENDING   Incomplete    Anti-infectives   Start     Dose/Rate Route  Frequency Ordered Stop   08/09/13 1200  vancomycin (VANCOCIN) IVPB 750 mg/150 ml premix     750 mg 150 mL/hr over 60 Minutes Intravenous Every Thu (Hemodialysis) 08/09/13 0948 08/16/13 1159   08/08/13 1200  vancomycin (VANCOCIN) IVPB 750 mg/150 ml premix  Status:  Discontinued     750 mg 150 mL/hr over 60 Minutes Intravenous Every M-W-F (Hemodialysis) 08/07/13 1551 08/08/13 1257   08/07/13 2000  meropenem (MERREM) 500 mg in sodium chloride 0.9 % 50 mL IVPB     500 mg 100 mL/hr over 30 Minutes Intravenous Every 24 hours 08/07/13 0959     08/07/13 1200  vancomycin (VANCOCIN) IVPB 750 mg/150 ml premix     750 mg 150 mL/hr over 60 Minutes Intravenous Every Tue (Hemodialysis) 08/07/13 0830 08/07/13 1123   08/06/13 1600  vancomycin (VANCOCIN) 1,250 mg in sodium chloride 0.9 % 250 mL IVPB     1,250 mg 166.7 mL/hr over 90 Minutes Intravenous  Once 08/06/13 1511 08/06/13 1842   08/06/13 1600  ertapenem (INVANZ) 0.5 g in sodium chloride 0.9 % 50 mL IVPB  Status:  Discontinued     500 mg 100 mL/hr over 30 Minutes Intravenous Every 24 hours 08/06/13 1531 08/07/13 0957   08/06/13 1500  clindamycin (CLEOCIN) IVPB 600 mg  Status:  Discontinued     600 mg 100 mL/hr over 30 Minutes Intravenous 3 times per day 08/06/13 1429 08/06/13 1531      Assessment: 79 y.o. M who presented with SOB and fatigue on 9/1 and was started on empiric antibiotics for rule out HCAP. The patient was switched from Ertapenem to Meropenem today for gram negative coverage, and continues on Vancomycin per Rx. The patient was loaded with Vancomycin 1250 mg on 9/1 and given a maintenance dose post HD off-schedule on 9/2. The patient is noted to be staying off-schedule, and is currently receiving HD this morning and is tolerating well despite having a low BP. Per discussion with the dialysis RN -- it is likely that he will complete his full session today.  Will schedule a normal Vancomycin maintenance dose.  The patient also was  continued on home warfarin for hx Afib. INR today is SUPRAtherapeutic (INR 3.39 << 2.67, goal of 2-3). Hgb/Hct/Plt stable. No overt s/x of bleeding noted.   Goal of Therapy:  Pre-HD Vancomycin level of 15-25 mcg/ml INR 2-3  Plan:  1. Vancomycin 750 mg IV x 1 dose post HD today.  2. Hold Warfarin dose today 3. Given that the patient is off of their normal HD schedule -- will not schedule additional Vancomycin doses for now. Will f/u HD plans.  4. Will continue to follow renal function, culture results, LOT, and antibiotic de-escalation plans  5. Will continue to monitor for any signs/symptoms of bleeding and will follow up with PT/INR in the a.m.   Georgina Pillion, PharmD, BCPS Clinical Pharmacist Pager: [phone] 08/09/2013 9:49 AM

## 2013-08-10 ENCOUNTER — Encounter (HOSPITAL_COMMUNITY): Payer: Self-pay | Admitting: Radiology

## 2013-08-10 LAB — PROTIME-INR
INR: 3.83 — ABNORMAL HIGH (ref 0.00–1.49)
Prothrombin Time: 36.2 seconds — ABNORMAL HIGH (ref 11.6–15.2)

## 2013-08-10 LAB — GLUCOSE, CAPILLARY
Glucose-Capillary: 162 mg/dL — ABNORMAL HIGH (ref 70–99)
Glucose-Capillary: 217 mg/dL — ABNORMAL HIGH (ref 70–99)

## 2013-08-10 MED ORDER — VANCOMYCIN HCL IN DEXTROSE 750-5 MG/150ML-% IV SOLN: 750.0000 mg | INTRAVENOUS | Status: DC

## 2013-08-10 MED ORDER — WARFARIN SODIUM 2 MG PO TABS: 2.0000 mg | ORAL_TABLET | Freq: Every day | ORAL | Status: AC

## 2013-08-10 MED ORDER — LEVOFLOXACIN 750 MG PO TABS
750.0000 mg | ORAL_TABLET | Freq: Once | ORAL | Status: AC
  Administered 2013-08-10: 18:00:00 750 mg via ORAL
  Filled 2013-08-10: qty 1

## 2013-08-10 NOTE — Progress Notes (Signed)
ANTIBIOTIC CONSULT NOTE - FOLLOW UP  Pharmacy Consult for Vancomycin + Warfarin  Indication: Empiric HCAP coverage and hx Afib  Allergies  Allergen Reactions  . Morphine And Related Other (See Comments)    "makes me hallucinate"  . Penicillins Rash  . Nsaids Other (See Comments)    "don't really know"  . Other Other (See Comments)    Allergic to cox2 inhibitors per [facility]    Patient Measurements: Height: 5\' 7"  (170.2 cm) Weight: 161 lb 6 oz (73.199 kg) IBW/kg (Calculated) : 66.1  Vital Signs: Temp: 98.1 F (36.7 C) (09/05 0443) Temp src: Oral (09/05 0443) BP: 121/74 mmHg (09/05 0443) Pulse Rate: 83 (09/05 0843) Intake/Output from previous day: 09/04 0701 - 09/05 0700 In: 360 [P.O.:360] Out: 1716 [Urine:50] Intake/Output from this shift:    Labs:  Recent Labs  08/08/13 0830 08/09/13 0716  WBC 6.5 8.6  HGB 10.6* 10.2*  PLT 282 304  CREATININE  --  5.59*   Estimated Creatinine Clearance: 10.3 ml/min (by C-G formula based on Cr of 5.59). No results found for this basename: VANCOTROUGH, Leodis Binet, VANCORANDOM, GENTTROUGH, GENTPEAK, GENTRANDOM, TOBRATROUGH, TOBRAPEAK, TOBRARND, AMIKACINPEAK, AMIKACINTROU, AMIKACIN,  in the last 72 hours   Microbiology: Recent Results (from the past 720 hour(s))  MRSA PCR SCREENING     Status: None   Collection Time    07/30/13 12:07 AM      Result Value Range Status   MRSA by PCR NEGATIVE  NEGATIVE Final   Comment:            The GeneXpert MRSA Assay (FDA     approved for NASAL specimens     only), is one component of a     comprehensive MRSA colonization     surveillance program. It is not     intended to diagnose MRSA     infection nor to guide or     monitor treatment for     MRSA infections.  URINE CULTURE     Status: None   Collection Time    07/30/13 12:43 AM      Result Value Range Status   Specimen Description URINE, RANDOM   Final   Special Requests ADDED 0222   Final   Culture  Setup Time     Final   Value:  07/30/2013 01:57     Performed at [facility] Count     Final   Value: >=100,000 COLONIES/ML     Performed at [facility]   Culture     Final   Value: ESCHERICHIA COLI     Note: Confirmed Extended Spectrum Beta-Lactamase Producer (ESBL) CRITICAL RESULT CALLED TO, READ BACK BY AND VERIFIED WITH: ALBERT PERIO @ 7:43AM  08/02/13 BY DWEEKS FOSFOMYCIN S 0.75ug/mL     PROTEUS MIRABILIS     Note: FOSFOMYCIN 0.50ug/mL     Performed at [facility]   Report Status 08/03/2013 FINAL   Final   Organism ID, Bacteria PROTEUS MIRABILIS   Final   Organism ID, Bacteria ESCHERICHIA COLI   Final   Organism ID, Bacteria PROTEUS MIRABILIS   Final  CULTURE, BLOOD (ROUTINE X 2)     Status: None   Collection Time    08/06/13  2:23 PM      Result Value Range Status   Specimen Description BLOOD LEFT HAND   Final   Special Requests BOTTLES DRAWN AEROBIC ONLY 2CC   Final   Culture  Setup Time  Final   Value: 08/06/2013 18:50     Performed at [facility]   Culture     Final   Value:        BLOOD CULTURE RECEIVED NO GROWTH TO DATE CULTURE WILL BE HELD FOR 5 DAYS BEFORE ISSUING A FINAL NEGATIVE REPORT     Performed at [facility]   Report Status PENDING   Incomplete  CULTURE, BLOOD (ROUTINE X 2)     Status: None   Collection Time    08/06/13  2:35 PM      Result Value Range Status   Specimen Description BLOOD LEFT HAND   Final   Special Requests BOTTLES DRAWN AEROBIC ONLY 1CC   Final   Culture  Setup Time     Final   Value: 08/06/2013 18:51     Performed at [facility]   Culture     Final   Value:        BLOOD CULTURE RECEIVED NO GROWTH TO DATE CULTURE WILL BE HELD FOR 5 DAYS BEFORE ISSUING A FINAL NEGATIVE REPORT     Performed at [facility]   Report Status PENDING   Incomplete    Anti-infectives   Start     Dose/Rate Route Frequency Ordered Stop   08/09/13 1200  vancomycin (VANCOCIN) IVPB 750 mg/150 ml premix     750 mg 150  mL/hr over 60 Minutes Intravenous Every Thu (Hemodialysis) 08/09/13 0948 08/09/13 1102   08/08/13 1200  vancomycin (VANCOCIN) IVPB 750 mg/150 ml premix  Status:  Discontinued     750 mg 150 mL/hr over 60 Minutes Intravenous Every M-W-F (Hemodialysis) 08/07/13 1551 08/08/13 1257   08/07/13 2000  meropenem (MERREM) 500 mg in sodium chloride 0.9 % 50 mL IVPB     500 mg 100 mL/hr over 30 Minutes Intravenous Every 24 hours 08/07/13 0959     08/07/13 1200  vancomycin (VANCOCIN) IVPB 750 mg/150 ml premix     750 mg 150 mL/hr over 60 Minutes Intravenous Every Tue (Hemodialysis) 08/07/13 0830 08/07/13 1123   08/06/13 1600  vancomycin (VANCOCIN) 1,250 mg in sodium chloride 0.9 % 250 mL IVPB     1,250 mg 166.7 mL/hr over 90 Minutes Intravenous  Once 08/06/13 1511 08/06/13 1842   08/06/13 1600  ertapenem (INVANZ) 0.5 g in sodium chloride 0.9 % 50 mL IVPB  Status:  Discontinued     500 mg 100 mL/hr over 30 Minutes Intravenous Every 24 hours 08/06/13 1531 08/07/13 0957   08/06/13 1500  clindamycin (CLEOCIN) IVPB 600 mg  Status:  Discontinued     600 mg 100 mL/hr over 30 Minutes Intravenous 3 times per day 08/06/13 1429 08/06/13 1531      Assessment: 79 y.o. M who presented with SOB and fatigue on 9/1 and was started on empiric antibiotics for rule out HCAP. The patient was switched from Ertapenem to Meropenem today for gram negative coverage, and continues on Vancomycin per Rx. The patient was loaded with Vancomycin 1250 mg on 9/1 and given a maintenance dose post HD off-schedule on 9/2. The patient is noted to be staying off-schedule, tolerated a full HD session on 9/4, planning again on 9/6, then to get back on MWF schedule starting Mon, 9/8.  Will schedule Vancomycin doses accordingly. Today is total abx D#5  The patient also was continued on home warfarin for hx Afib. INR today remains SUPRAtherapeutic despite holding yesterday's dose (INR 3.83 << 3.39, goal of 2-3). No CBC today, Hgb/Hct/Plt  stable  from 9/4 labs. No overt s/x of bleeding noted.   Goal of Therapy:  Pre-HD Vancomycin level of 15-25 mcg/ml INR 2-3  Plan:  1. Vancomycin 750 mg IV x 1 dose post HD on Sat 2. Start Vancomycin 750 mg IV post HD-MWF on Mon, 9/8 3. Hold Warfarin dose today 4. Will continue to follow renal function, culture results, LOT, and antibiotic de-escalation plans  5. Will continue to monitor for any signs/symptoms of bleeding and will follow up with PT/INR in the a.m.   Georgina Pillion, PharmD, BCPS Clinical Pharmacist Pager: [phone] 08/10/2013 9:18 AM

## 2013-08-10 NOTE — Discharge Summary (Addendum)
Physician Discharge Summary  [name] [name] ZOX:[id number] DOB: November 06, 1934 DOA: 08/06/2013  PCP: [name], MD  Admit date: 08/06/2013 Discharge date: 08/11/2013  Recommendations for Outpatient Follow-up:  Do not give warfarin on Saturday or Sunday 9/6 or 9/7. Recheck PT/INR in dialysis on Monday and restart warfarin at lower dose of 2 mg if appropriate Monitory PT/INR daily until INR is stable and therapeutic at 2-3.  Adjust warfarin dose as needed to keep INR therapeutic Monitor blood glucose 3 times per day Hypoglycemia Precautions recommended Return if symptoms recur, worsen or new problems develop.  Discharge Diagnoses:  Principal Problem:   HCAP (healthcare-associated pneumonia) Active Problems:   ESRD on hemodialysis   DM2 (diabetes mellitus, type 2)   HTN (hypertension)   ICD (implantable cardioverter-defibrillator) in place   S/P AKA (above knee amputation)   CAD (coronary artery disease)   S/P CABG (coronary artery bypass graft)   Atrial fibrillation   Warfarin anticoagulation   Mental status change   Leukocytosis, unspecified   Dehydration  Discharge Condition: stable  Diet recommendation: heart healthy, carb modified, renal  Filed Weights   08/08/13 2009 08/09/13 0659 08/09/13 2005  Weight: 72.2 kg (159 lb 2.8 oz) 73.2 kg (161 lb 6 oz) 73.199 kg (161 lb 6 oz)    History of present illness:  [name] [name] is a 79 y.o. male [name] [name] is a 79 y.o. PMHx DM type II, HTN, ESRD on HD M/W/F, CAD/ischemic cardiomyopathy, S/P CABG,, AICD, paroxysmal A. fib on Coumadin, COPD resident of assisted-living facility recently discharged on 08/02/2013 after being treated for respiratory failure/renal failure/pneumonia. presented to the [facility] ER today when she was seen by Dr. Merrilee Seashore. Per Dr. Mellody Dance patient stated did not receive his HD today secondary to having a fever of 104 on presentation however initial temp at the ED was within normal limits. However patient did  have leukocytosis (14), 3% bands, BNP 55,000, SOB and positive troponin. Upon presentation, patient was lethargic mildly confused but would answer questions. Would fall asleep in the middle of conversation. States unsure why the HD unit sent him over to [facility], but states he has not been sick negative F/C/S, (-) N/V, (-) Diarrhea. The patient's only complaint is increasing fatigue, SOB. Patient states his base weight= 174lbs, current weight 163.5 lbs. however review of patient's last visit shows his weight 1 day prior to discharge to be in 159.3lbs   Hospital Course:  1.HCAP - improved now with aggressive treatment,- admitted with productive cough and was recently hospitalized, mental status was poor upon admission, question slight aspiration, he has responded very well to empiric antibiotics, appreciate pharmacy assistance, hemodynamically stable and tolerating dialysis, will continue aspiration precautions, feeding assistance and will have PT evaluate the patient. Pt did well for swallow evaluation and regular diet with thin liquids was recommended.  See speech pathology notes.  Monitor blood cultures.  Pt was treated with full course of antibiotic therapy for HCAP.    2. ESRD; patient on HD M/W/F, renal has seen the patient he's been dialyzed in hospital and last HD right before being discharged on Friday 9/5.    3. Atrial fib; continue patient on Coumadin but hold for 2 days and recheck PT/INR on Monday 9/8.  Restart warfarin at lower dose of 2 mg and adjust as appropriate to keep INR 2-3. Pt had an elevated PT/INR 3.3 on day of discharge and pharmacist has recommended that warfarin be held for 2 days and rechecked in HD on  Monday.  Restart warfarin at lower dose and adjust warfarin as needed to keep INR therapeutic (2-3).   Also on low-dose aspirin which will be continued, he has chronic hypotension and cannot tolerate beta blocker/calcium channel blocker.  Cardiology followed patient in  hospital.   4. CABG/ICD in implant/history NSTEMI; - chest pain-free, troponin negative, had some nonspecific EKG changes upon admission and the admitting physician had consulted Dr. Thurmon Fair (Cardiology) , continue low-dose aspirin and monitor clinically. He's not a candidate for invasive procedures.   5. DM type II; patient placed on moderate SSI for control of glucose . Blood sugars well controlled. Resume home insulin doses at discharge.  Monitor BS 3 times per day and hypoglycemia precautions are recommended.   Code Status: DNR  Disposition: Return to Assisted Living  Consultations:  Cardiology, renal, pharmacy  Discharge Exam: Pt says he feels much better and asking to be discharged. No CP and no SOB.   Filed Vitals:   08/10/13 0954  BP: 136/64  Pulse: 88  Temp: 97.6 F (36.4 C)  Resp: 18   General: awake, alert, no distress, cooperative and pleasant Cardiovascular: normal s1, s2 sounds Respiratory: BBS clear  Discharge Instructions  Discharge Orders   Future Orders Complete By Expires   Diet - low sodium heart healthy  As directed    Discharge instructions  As directed    Comments:     Do not give warfarin on Saturday or Sunday. Recheck PT/INR in dialysis on Monday and restart warfarin at lower dose of 2 mg if appropriate Monitory PT/INR daily until INR is stable and therapeutic at 1-2. Monitor blood glucose 3 times per day Hypoglycemia Precautions recommended Return if symptoms recur, worsen or new problems develop.   Increase activity slowly  As directed        Medication List         acetaminophen 650 MG suppository  Commonly known as:  TYLENOL  Place 650 mg rectally every 4 (four) hours as needed for fever.     aspirin 81 MG EC tablet  Take 1 tablet (81 mg total) by mouth daily.     bisacodyl 10 MG suppository  Commonly known as:  DULCOLAX  Place 10 mg rectally as needed for constipation.     calcium acetate 667 MG capsule  Commonly known  as:  PHOSLO  Take 1,334 mg by mouth 3 (three) times daily with meals.     carvedilol 3.125 MG tablet  Commonly known as:  COREG  Take 1 tablet (3.125 mg total) by mouth 2 (two) times daily with a meal.     feeding supplement (NEPRO CARB STEADY) Liqd  Take 237 mLs by mouth daily.     finasteride 5 MG tablet  Commonly known as:  PROSCAR  Take 5 mg by mouth every evening.     gabapentin 300 MG capsule  Commonly known as:  NEURONTIN  Take 300 mg by mouth every morning.     geriatric multivitamins-minerals Elix  Take 15 mLs by mouth daily.     insulin glargine 100 UNIT/ML injection  Commonly known as:  LANTUS  Inject 40 Units into the skin at bedtime.     insulin regular 100 units/mL injection  Commonly known as:  NOVOLIN R,HUMULIN R  Inject 2-8 Units into the skin 3 (three) times daily before meals. SSI:  CBG 200-250= 2 units; 251-300= 4 units; 301-350= 6 units; 351-400= 8 units; > 400= call MD     ipratropium-albuterol 0.5-2.5 (  3) MG/3ML Soln  Commonly known as:  DUONEB  Take 3 mLs by nebulization every 6 (six) hours. Respiratory distress     ketoconazole 2 % shampoo  Commonly known as:  NIZORAL  Apply 1 application topically 3 (three) times a week. TUE, THU,SAT     levothyroxine 75 MCG tablet  Commonly known as:  SYNTHROID, LEVOTHROID  Take 75 mcg by mouth at bedtime.     midodrine 10 MG tablet  Commonly known as:  PROAMATINE  Take 1 tablet (10 mg total) by mouth 3 (three) times daily.     pantoprazole 40 MG tablet  Commonly known as:  PROTONIX  Take 40 mg by mouth daily.     promethazine 25 MG/ML injection  Commonly known as:  PHENERGAN  Inject 12.5 mg into the muscle every 6 (six) hours as needed for nausea or vomiting.     ranitidine 150 MG tablet  Commonly known as:  ZANTAC  Take 150 mg by mouth at bedtime.     warfarin 2 MG tablet  Commonly known as:  COUMADIN  Take 1 tablet (2 mg total) by mouth daily.  Start taking on:  08/13/2013       Allergies   Allergen Reactions  . Morphine And Related Other (See Comments)    "makes me hallucinate"  . Penicillins Rash  . Nsaids Other (See Comments)    "don't really know"  . Other Other (See Comments)    Allergic to cox2 inhibitors per [facility]       Follow-up Information   Follow up with SOPALA,JERZY, MD In 1 week.   Specialty:  Internal Medicine      Follow up with Hemodialysis MWF In 3 days. (Have PT/INR checked in HD on Monday)       The results of significant diagnostics from this hospitalization (including imaging, microbiology, ancillary and laboratory) are listed below for reference.    Significant Diagnostic Studies: Dg Chest 2 View  08/08/2013   CLINICAL DATA:  Pneumonia, shortness of breath.  EXAM: CHEST  2 VIEW  COMPARISON:  08/06/2013  FINDINGS: Left AICD remains in place, unchanged. Prior CABG. Low lung volumes. Patchy bilateral opacities could reflect edema or infection. Suspect small bilateral effusions. No acute bony abnormality.  IMPRESSION: Continued low lung volumes. Patchy bilateral opacities which could reflect edema or infection. Small effusions.   Electronically Signed   By: Charlett Nose   On: 08/08/2013 10:26   Dg Chest Port 1 View  08/06/2013   *RADIOLOGY REPORT*  Clinical Data: Shortness of breath.  End-stage renal disease - missed dialysis.  PORTABLE CHEST - 1 VIEW  Comparison: 08/06/2013  Findings: Cardiomegaly, CABG changes and left-sided AICD / pacemaker again noted. Pulmonary vascular congestion noted with bilateral pleural effusions and bibasilar atelectasis. Possible mild interstitial edema noted. No acute bony abnormalities are present. Remote rib fractures are identified.  IMPRESSION: Cardiomegaly, pulmonary vascular congestion and possible mild interstitial edema.  Bilateral pleural effusions and bibasilar atelectasis.   Original Report Authenticated By: Harmon Pier, M.D.   Dg Chest Port 1 View  08/02/2013   *RADIOLOGY REPORT*  Clinical Data: Follow up of  pulmonary edema.  Shortness of breath.  PORTABLE CHEST - 1 VIEW  Comparison: 1 day prior  Findings: Pacer / AICD device. Prior median sternotomy.  Patient rotated to the left. Cardiomegaly accentuated by AP portable technique.  Cannot exclude small left pleural effusion. No pneumothorax.  No significant change in mild interstitial edema. Improved right upper lobe airspace disease.  Persistent left base air space disease.  IMPRESSION: Similar congestive heart failure with slight improvement in right upper lobe airspace disease.  Similar left base atelectasis or infection with adjacent small left pleural effusion.   Original Report Authenticated By: Jeronimo Greaves, M.D.   Dg Chest Port 1 View  08/01/2013   *RADIOLOGY REPORT*  Clinical Data: Follow up of pulmonary edema.  PORTABLE CHEST - 1 VIEW  Comparison: 07/29/2013  Findings: Patient rotated to the left.  Pacer / AICD device with leads right atrium right ventricle. Prior median sternotomy. Cardiomegaly accentuated by AP portable technique.  No pneumothorax.  Small left and trace right pleural effusions are similar.  Mild to moderate interstitial edema is not significantly changed.  Lower lobe predominant airspace opacities are greater on the left than right and similar.  IMPRESSION: No significant change since the prior exam.  Congestive heart failure with bilateral pleural effusions and lower lobe predominant airspace disease.  This could represent atelectasis or concurrent infection.  Suboptimal patient positioning.   Original Report Authenticated By: Jeronimo Greaves, M.D.   Microbiology: Recent Results (from the past 240 hour(s))  CULTURE, BLOOD (ROUTINE X 2)     Status: None   Collection Time    08/06/13  2:23 PM      Result Value Range Status   Specimen Description BLOOD LEFT HAND   Final   Special Requests BOTTLES DRAWN AEROBIC ONLY 2CC   Final   Culture  Setup Time     Final   Value: 08/06/2013 18:50     Performed at [facility]   Culture      Final   Value:        BLOOD CULTURE RECEIVED NO GROWTH TO DATE CULTURE WILL BE HELD FOR 5 DAYS BEFORE ISSUING A FINAL NEGATIVE REPORT     Performed at [facility]   Report Status PENDING   Incomplete  CULTURE, BLOOD (ROUTINE X 2)     Status: None   Collection Time    08/06/13  2:35 PM      Result Value Range Status   Specimen Description BLOOD LEFT HAND   Final   Special Requests BOTTLES DRAWN AEROBIC ONLY 1CC   Final   Culture  Setup Time     Final   Value: 08/06/2013 18:51     Performed at [facility]   Culture     Final   Value:        BLOOD CULTURE RECEIVED NO GROWTH TO DATE CULTURE WILL BE HELD FOR 5 DAYS BEFORE ISSUING A FINAL NEGATIVE REPORT     Performed at [facility]   Report Status PENDING   Incomplete    Labs: Basic Metabolic Panel:  Recent Labs Lab 08/06/13 1332 08/07/13 0750 08/09/13 0716  NA 137 136 133*  K 3.8 3.7 3.7  CL 96 95* 92*  CO2 25 23 26   GLUCOSE 203* 151* 132*  BUN 67* 74* 45*  CREATININE 6.72* 7.48* 5.59*  CALCIUM 9.1 9.0 9.2  MG 2.1  --   --   PHOS  --  3.6 4.1   Liver Function Tests:  Recent Labs Lab 08/06/13 1332 08/07/13 0750 08/09/13 0716  AST 11  --   --   ALT 12  --   --   ALKPHOS 83  --   --   BILITOT 0.2*  --   --   PROT 6.2  --   --   ALBUMIN 2.5* 2.5* 2.6*  No results found for this basename: LIPASE, AMYLASE,  in the last 168 hours No results found for this basename: AMMONIA,  in the last 168 hours CBC:  Recent Labs Lab 08/06/13 1332 08/07/13 0758 08/08/13 0830 08/09/13 0716  WBC 17.3* 7.8 6.5 8.6  NEUTROABS 15.8*  --   --   --   HGB 9.1* 9.3* 10.6* 10.2*  HCT 28.9* 29.5* 33.9* 32.2*  MCV 103.2* 102.8* 104.3* 101.9*  PLT 293 244 282 304   Cardiac Enzymes:  Recent Labs Lab 08/06/13 1332 08/07/13 1355  TROPONINI <0.30 <0.30   BNP: BNP (last 3 results) No results found for this basename: PROBNP,  in the last 8760 hours CBG:  Recent Labs Lab 08/09/13 2010 08/09/13 2336  08/10/13 0448 08/10/13 0736 08/10/13 1129  GLUCAP 163* 151* 182* 162* 217*   I have spent 37 mins preparing discharge, reviewing records, labs and consulting with pharmacist, dictating summary, etc.  Signed:  Bless Belshe  [facility] 08/10/2013, 2:02 PM  Addendum:  Pt's discharge on 9/5 was held because his HD access clotted.  He was sent to IR on 9/6 to have it declotted and will discharge after HD 9/6.  Maryln Manuel, MD

## 2013-08-10 NOTE — Clinical Social Work Note (Addendum)
Mr. Schinke was to discharge back to [facility] today, however his graft clotted in dialysis and he will discharge to facility on Saturday. CSW contacted [facility] in admissions at [facility] and updated her. Weekend CSW will handle discharge. CSW contacted patient's daughter Ms. McCurry to inform her and she indicated that she will contact her sister.    Genelle Bal, MSW, LCSW [phone]

## 2013-08-10 NOTE — Procedures (Signed)
I have seen and examined this patient and agree with the plan of care . Patients access no clotted and will go back to room. Will declot in AM [facility] W 08/10/2013, 3:57 PM

## 2013-08-10 NOTE — Progress Notes (Signed)
[facility] ROUNDING NOTE   Subjective:   Interval History: No complaints this AM  Objective:  Vital signs in last 24 hours:  Temp:  [97.6 F (36.4 C)-99.1 F (37.3 C)] 98.1 F (36.7 C) (09/05 1415) Pulse Rate:  [63-101] 101 (09/05 1415) Resp:  [18] 18 (09/05 1415) BP: (119-153)/(58-78) 119/58 mmHg (09/05 1415) SpO2:  [96 %-100 %] 98 % (09/05 1415) Weight:  [73.199 kg (161 lb 6 oz)] 73.199 kg (161 lb 6 oz) (09/04 2005)  Weight change: 0.999 kg (2 lb 3.3 oz) Filed Weights   08/08/13 2009 08/09/13 0659 08/09/13 2005  Weight: 72.2 kg (159 lb 2.8 oz) 73.2 kg (161 lb 6 oz) 73.199 kg (161 lb 6 oz)    Intake/Output: I/O last 3 completed shifts: In: 530 [P.O.:480; IV Piggyback:50] Out: 1716 [Urine:50; Other:1666]   Intake/Output this shift:  Total I/O In: 480 [P.O.:480] Out: -   CVS- RRR RS- CTA ABD- BS present soft non-distended EXT- BKA no edema   Basic Metabolic Panel:  Recent Labs Lab 08/06/13 1332 08/07/13 0750 08/09/13 0716  NA 137 136 133*  K 3.8 3.7 3.7  CL 96 95* 92*  CO2 25 23 26   GLUCOSE 203* 151* 132*  BUN 67* 74* 45*  CREATININE 6.72* 7.48* 5.59*  CALCIUM 9.1 9.0 9.2  MG 2.1  --   --   PHOS  --  3.6 4.1    Liver Function Tests:  Recent Labs Lab 08/06/13 1332 08/07/13 0750 08/09/13 0716  AST 11  --   --   ALT 12  --   --   ALKPHOS 83  --   --   BILITOT 0.2*  --   --   PROT 6.2  --   --   ALBUMIN 2.5* 2.5* 2.6*   No results found for this basename: LIPASE, AMYLASE,  in the last 168 hours No results found for this basename: AMMONIA,  in the last 168 hours  CBC:  Recent Labs Lab 08/06/13 1332 08/07/13 0758 08/08/13 0830 08/09/13 0716  WBC 17.3* 7.8 6.5 8.6  NEUTROABS 15.8*  --   --   --   HGB 9.1* 9.3* 10.6* 10.2*  HCT 28.9* 29.5* 33.9* 32.2*  MCV 103.2* 102.8* 104.3* 101.9*  PLT 293 244 282 304    Cardiac Enzymes:  Recent Labs Lab 08/06/13 1332 08/07/13 1355  TROPONINI <0.30 <0.30    BNP: No  components found with this basename: POCBNP,   CBG:  Recent Labs Lab 08/09/13 2010 08/09/13 2336 08/10/13 0448 08/10/13 0736 08/10/13 1129  GLUCAP 163* 151* 182* 162* 217*    Microbiology: Results for orders placed during the hospital encounter of 08/06/13  CULTURE, BLOOD (ROUTINE X 2)     Status: None   Collection Time    08/06/13  2:23 PM      Result Value Range Status   Specimen Description BLOOD LEFT HAND   Final   Special Requests BOTTLES DRAWN AEROBIC ONLY 2CC   Final   Culture  Setup Time     Final   Value: 08/06/2013 18:50     Performed at [facility]   Culture     Final   Value:        BLOOD CULTURE RECEIVED NO GROWTH TO DATE CULTURE WILL BE HELD FOR 5 DAYS BEFORE ISSUING A FINAL NEGATIVE REPORT     Performed at [facility]   Report Status PENDING   Incomplete  CULTURE, BLOOD (ROUTINE X 2)  Status: None   Collection Time    08/06/13  2:35 PM      Result Value Range Status   Specimen Description BLOOD LEFT HAND   Final   Special Requests BOTTLES DRAWN AEROBIC ONLY 1CC   Final   Culture  Setup Time     Final   Value: 08/06/2013 18:51     Performed at [facility]   Culture     Final   Value:        BLOOD CULTURE RECEIVED NO GROWTH TO DATE CULTURE WILL BE HELD FOR 5 DAYS BEFORE ISSUING A FINAL NEGATIVE REPORT     Performed at [facility]   Report Status PENDING   Incomplete    Coagulation Studies:  Recent Labs  08/08/13 0830 08/09/13 0555 08/10/13 0535  LABPROT 27.5* 33.0* 36.2*  INR 2.67* 3.39* 3.83*    Urinalysis: No results found for this basename: COLORURINE, APPERANCEUR, LABSPEC, PHURINE, GLUCOSEU, HGBUR, BILIRUBINUR, KETONESUR, PROTEINUR, UROBILINOGEN, NITRITE, LEUKOCYTESUR,  in the last 72 hours    Imaging: No results found.   Medications:     . albuterol  2.5 mg Nebulization BID   And  . ipratropium  0.5 mg Nebulization BID  . aspirin EC  81 mg Oral Daily  . calcium acetate  1,334 mg Oral TID WC   . carvedilol  3.125 mg Oral BID WC  . [START ON 08/17/2013] darbepoetin  60 mcg Intravenous Q Fri-HD  . finasteride  5 mg Oral QPM  . gabapentin  300 mg Oral QHS  . insulin aspart  0-15 Units Subcutaneous TID WC  . levofloxacin  750 mg Oral Once  . levothyroxine  75 mcg Oral QAC breakfast  . midodrine  10 mg Oral TID WC  . multivitamin  1 tablet Oral QHS  . pantoprazole  40 mg Oral Daily  . Warfarin - Pharmacist Dosing Inpatient   Does not apply q1800   acetaminophen, acetaminophen, bisacodyl, guaiFENesin-dextromethorphan, nitroGLYCERIN, ondansetron (ZOFRAN) IV, polyvinyl alcohol  Assessment/ Plan:       ESRD-MWF       ANEMIA- stable       MBD- stable calcium and phosphorus       HTN/VOL stable last HD yesterday       OTHER-CABG/ICD in implant/history NSTEMI;        AFib anticoagulated   Patient will need a declot tomorrow and dialysis. He can then be discharged to nursing home    LOS: 4 Jehad Bisono W @TODAY @3 :58 PM

## 2013-08-10 NOTE — Progress Notes (Signed)
Patient ID: [name] [name], male   DOB: [birth year], 80 y.o.   MRN: [id number]   There is nothing else to add from the cardiology viewpoint at this time. We will sign off.  Jerral Bonito, MD
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
## 2013-08-11 ENCOUNTER — Inpatient Hospital Stay (HOSPITAL_COMMUNITY): Payer: PRIVATE HEALTH INSURANCE

## 2013-08-11 LAB — GLUCOSE, CAPILLARY: Glucose-Capillary: 181 mg/dL — ABNORMAL HIGH (ref 70–99)

## 2013-08-11 LAB — RENAL FUNCTION PANEL
Albumin: 2.7 g/dL — ABNORMAL LOW (ref 3.5–5.2)
BUN: 37 mg/dL — ABNORMAL HIGH (ref 6–23)
Chloride: 93 mEq/L — ABNORMAL LOW (ref 96–112)
GFR calc Af Amer: 11 mL/min — ABNORMAL LOW (ref >90)
GFR calc non Af Amer: 10 mL/min — ABNORMAL LOW (ref >90)
Potassium: 3 mEq/L — ABNORMAL LOW (ref 3.5–5.1)

## 2013-08-11 LAB — CBC
HCT: 34.7 % — ABNORMAL LOW (ref 39.0–52.0)
RDW: 15.5 % (ref 11.5–15.5)
WBC: 10.3 10*3/uL (ref 4.0–10.5)

## 2013-08-11 MED ORDER — LIDOCAINE-PRILOCAINE 2.5-2.5 % EX CREA: 1.0000 "application " | TOPICAL_CREAM | CUTANEOUS | Status: DC | PRN

## 2013-08-11 MED ORDER — IOHEXOL 300 MG/ML  SOLN
100.0000 mL | Freq: Once | INTRAMUSCULAR | Status: AC | PRN
  Administered 2013-08-11: 10:00:00 50 mL via INTRAVENOUS

## 2013-08-11 MED ORDER — LIDOCAINE HCL (PF) 1 % IJ SOLN: 5.0000 mL | INTRAMUSCULAR | Status: DC | PRN

## 2013-08-11 MED ORDER — DARBEPOETIN ALFA-POLYSORBATE 60 MCG/0.3ML IJ SOLN
INTRAMUSCULAR | Status: AC
  Administered 2013-08-11: 60 ug via INTRAVENOUS
  Filled 2013-08-11: qty 0.3

## 2013-08-11 MED ORDER — HEPARIN SODIUM (PORCINE) 1000 UNIT/ML DIALYSIS: 1000.0000 [IU] | INTRAMUSCULAR | Status: DC | PRN

## 2013-08-11 MED ORDER — PENTAFLUOROPROP-TETRAFLUOROETH EX AERO: 1.0000 "application " | INHALATION_SPRAY | CUTANEOUS | Status: DC | PRN

## 2013-08-11 MED ORDER — HEPARIN SODIUM (PORCINE) 1000 UNIT/ML DIALYSIS
1000.0000 [IU] | INTRAMUSCULAR | Status: DC | PRN
  Filled 2013-08-11: qty 1

## 2013-08-11 MED ORDER — ALTEPLASE 100 MG IV SOLR
2.0000 mg | INTRAVENOUS | Status: DC
Start: 2013-08-11 — End: 2013-08-11
  Filled 2013-08-11: qty 2

## 2013-08-11 MED ORDER — NEPRO/CARBSTEADY PO LIQD: 237.0000 mL | ORAL | Status: DC | PRN

## 2013-08-11 MED ORDER — FENTANYL CITRATE 0.05 MG/ML IJ SOLN
INTRAMUSCULAR | Status: AC | PRN
  Administered 2013-08-11: 50 ug via INTRAVENOUS

## 2013-08-11 MED ORDER — SODIUM CHLORIDE 0.9 % IV SOLN: 100.0000 mL | INTRAVENOUS | Status: DC | PRN

## 2013-08-11 MED ORDER — ALTEPLASE 2 MG IJ SOLR
2.0000 mg | Freq: Once | INTRAMUSCULAR | Status: DC | PRN
  Filled 2013-08-11: qty 2

## 2013-08-11 MED ORDER — ALTEPLASE 2 MG IJ SOLR: 2.0000 mg | Freq: Once | INTRAMUSCULAR | Status: DC | PRN

## 2013-08-11 MED ORDER — HEPARIN SODIUM (PORCINE) 1000 UNIT/ML IJ SOLN
INTRAMUSCULAR | Status: AC | PRN
  Administered 2013-08-11: 11:00:00 3000 [IU] via INTRAVENOUS

## 2013-08-11 MED ORDER — MIDAZOLAM HCL 2 MG/2ML IJ SOLN
INTRAMUSCULAR | Status: AC | PRN
  Administered 2013-08-11 (×2): 1 mg via INTRAVENOUS

## 2013-08-11 NOTE — Progress Notes (Signed)
Subjective:  No complaints, ready for discharge yesterday, but delayed for declot of AVG, followed by dialysis, this morning.  Objective: Vital signs in last 24 hours: Temp:  [97.6 F (36.4 C)-98.1 F (36.7 C)] 97.7 F (36.5 C) (09/06 0529) Pulse Rate:  [69-101] 77 (09/06 0529) Resp:  [16-18] 18 (09/06 0529) BP: (119-146)/(58-93) 140/90 mmHg (09/06 0529) SpO2:  [97 %-100 %] 98 % (09/06 0529) Weight:  [71.5 kg (157 lb 10.1 oz)] 71.5 kg (157 lb 10.1 oz) (09/05 2159) Weight change: -1.699 kg (-3 lb 11.9 oz)  Intake/Output from previous day: 09/05 0701 - 09/06 0700 In: 660 [P.O.:660] Out: -  Intake/Output this shift: Total I/O In: 120 [P.O.:120] Out: -   Lab Results:  Recent Labs  08/08/13 0830 08/09/13 0716  WBC 6.5 8.6  HGB 10.6* 10.2*  HCT 33.9* 32.2*  PLT 282 304   BMET:  Recent Labs  08/09/13 0716  NA 133*  K 3.7  CL 92*  CO2 26  GLUCOSE 132*  BUN 45*  CREATININE 5.59*  CALCIUM 9.2  ALBUMIN 2.6*   No results found for this basename: PTH,  in the last 72 hours Iron Studies: No results found for this basename: IRON, TIBC, TRANSFERRIN, FERRITIN,  in the last 72 hours  EXAM: General appearance:  Alert, in no apparent distress Resp:  CTA without rales, rhonchi, or wheezes Cardio:  Irregular rhythm without murmur or rub GI:  + BS, soft and nontender Extremities:  R AKA, no edema on L Access: AVG @ RUA without bruit  Dialysis Orders: MWF at [place]  69.5 kg 3hr 30 min 2K/2.25Ca+ 2000 Heparin RUA AVG 400/Af 1.5  No hectorol Epogen 6000 Units IV/HD No Venofer   Assessment/Plan: 1. HCAP - improved, s/p antibiotics. 2. ESRD - HD on MWF @ [facility]; HD delayed yesterday sec to clotted access.  HD pending today. 3. Clotted access - AVG @ RUA clotted yesterday, declot per IR pending this morning. 4. Atrial fib - Coumadin on hold for high INR 3.3, to be rechecked at HD on 9/8 5. HTN/Volume - BP 140/90, wt 71.5 kg with EDW 69.5, HD pending. 6. Anemia - Hgb 10.2 on  Aranesp 60 mcg on Fri, to be given today. 7. Sec HPT - Ca 9.2, P 4.1; no Hectorol, Phoslo 2 with meals. 8. DM - on insulin.    LOS: 5 days   [name] [name] 08/11/2013,6:40 AM

## 2013-08-11 NOTE — Procedures (Signed)
Technically successful declot of right upper arm graft.  No immediate post procedural complications.

## 2013-08-11 NOTE — Progress Notes (Signed)
ANTICOAGULATION CONSULT NOTE - Follow Up Consult  Pharmacy Consult for coumadin Indication: hx Afib  Allergies  Allergen Reactions  . Morphine And Related Other (See Comments)    "makes me hallucinate"  . Penicillins Rash  . Nsaids Other (See Comments)    "don't really know"  . Other Other (See Comments)    Allergic to cox2 inhibitors per [facility]    Patient Measurements: Height: 5\' 7"  (170.2 cm) Weight: 157 lb 10.1 oz (71.5 kg) IBW/kg (Calculated) : 66.1   Vital Signs: Temp: 97.7 F (36.5 C) (09/06 0529) Temp src: Oral (09/06 0529) BP: 140/90 mmHg (09/06 0529) Pulse Rate: 77 (09/06 0529)  Labs:  Recent Labs  08/08/13 0830 08/09/13 0555 08/09/13 0716 08/10/13 0535 08/11/13 0400  HGB 10.6*  --  10.2*  --   --   HCT 33.9*  --  32.2*  --   --   PLT 282  --  304  --   --   LABPROT 27.5* 33.0*  --  36.2* 37.4*  INR 2.67* 3.39*  --  3.83* 3.99*  CREATININE  --   --  5.59*  --   --     Estimated Creatinine Clearance: 10.3 ml/min (by C-G formula based on Cr of 5.59).   Assessment: Patient is a 79 y.o M on coumadin for hx Afib.  INR cont to increase despite holding coumadin for the past couple of days.  No bleeding noted.  Plan for declot of AVG and possible d/c home after HD session today.  Goal of Therapy:  INR 2-3 Monitor platelets by anticoagulation protocol: Yes   Plan:  1) cont to hold coumadin for now 2) will f/u with patient in AM if still here  Sophiarose Eades P 08/11/2013,7:28 AM

## 2013-08-11 NOTE — Progress Notes (Signed)
Patient discharged to [facility].  Called report to Alveda Reasons., RN.  Patient AVS, and an updated MOST form will be sent with EMS to the facility.  Patient remains stable; no signs or symptoms of distress.  EMS delayed in pick up of patient so report was given to night shift nurse. Paperwork completed and ready to be picked up by EMS at change of shift.  Patient's daughter was notified that patient will be delayed in arriving to [facility] this evening.

## 2013-08-11 NOTE — Progress Notes (Signed)
Pt to discharge after HD today.    Maryln Manuel, MD

## 2013-08-11 NOTE — Progress Notes (Signed)
I have seen and examined this patient and agree with the plan of care  Clotted yesterday plan dialysis today [facility] W 08/11/2013, 10:07 AM

## 2013-08-11 NOTE — Progress Notes (Signed)
CSW contacted [facility] and pt's family to discuss discharge to SNF today.  Pt to be transported by ambulance to SNF.  No further interventions needed by CSW at this time.  Please reconsult if further needs arise.   Fleet Contras: [phone]

## 2013-08-13 LAB — CULTURE, BLOOD (ROUTINE X 2)

## 2014-01-01 IMAGING — CR DG CHEST 2V
2 series · 2 of 2 positions shown · non-contrast
Comparison: 08/06/2013; 08/02/2013

CLINICAL DATA: Shortness of breath, weakness

CHEST - 2 VIEW

[w chest lat]
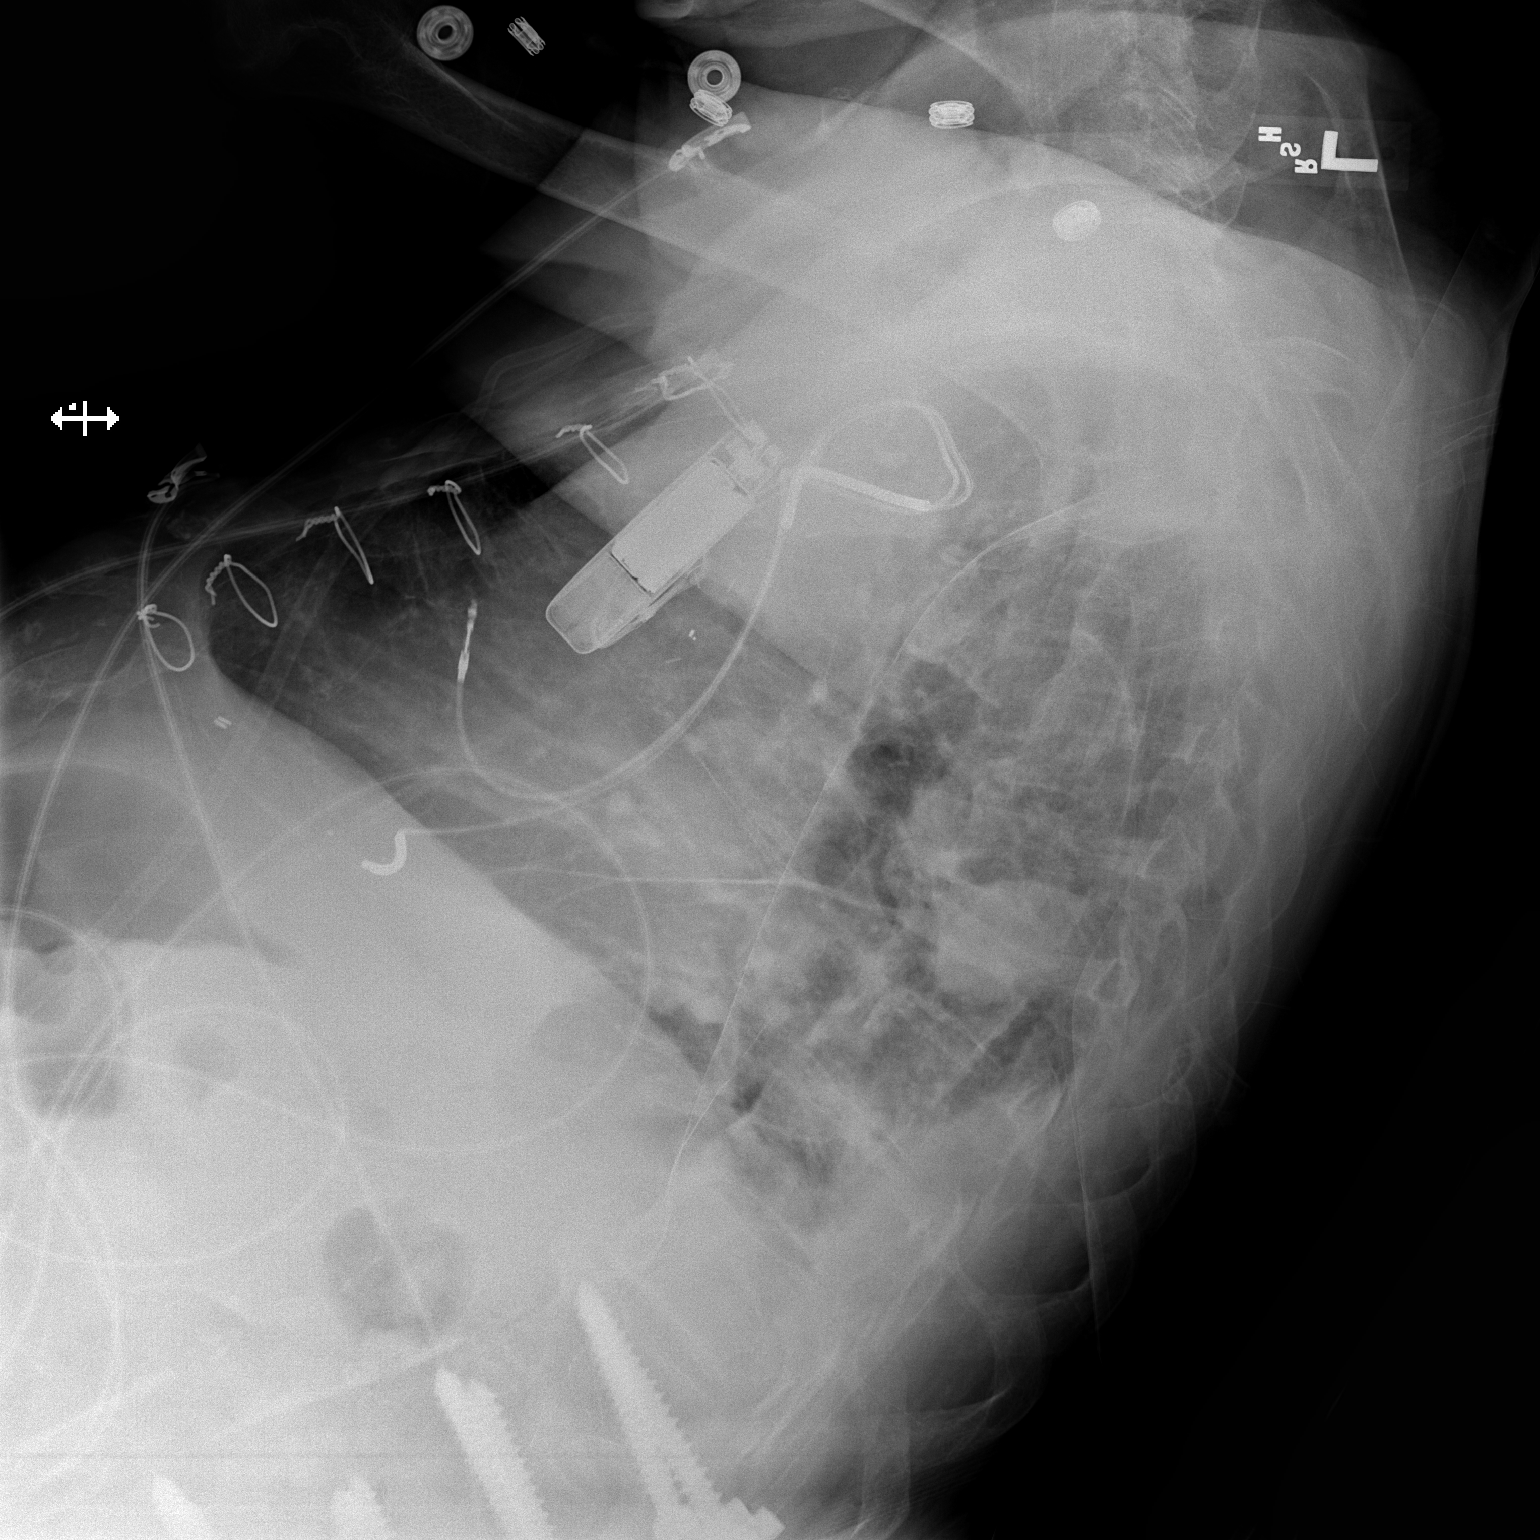

[x chest ap]
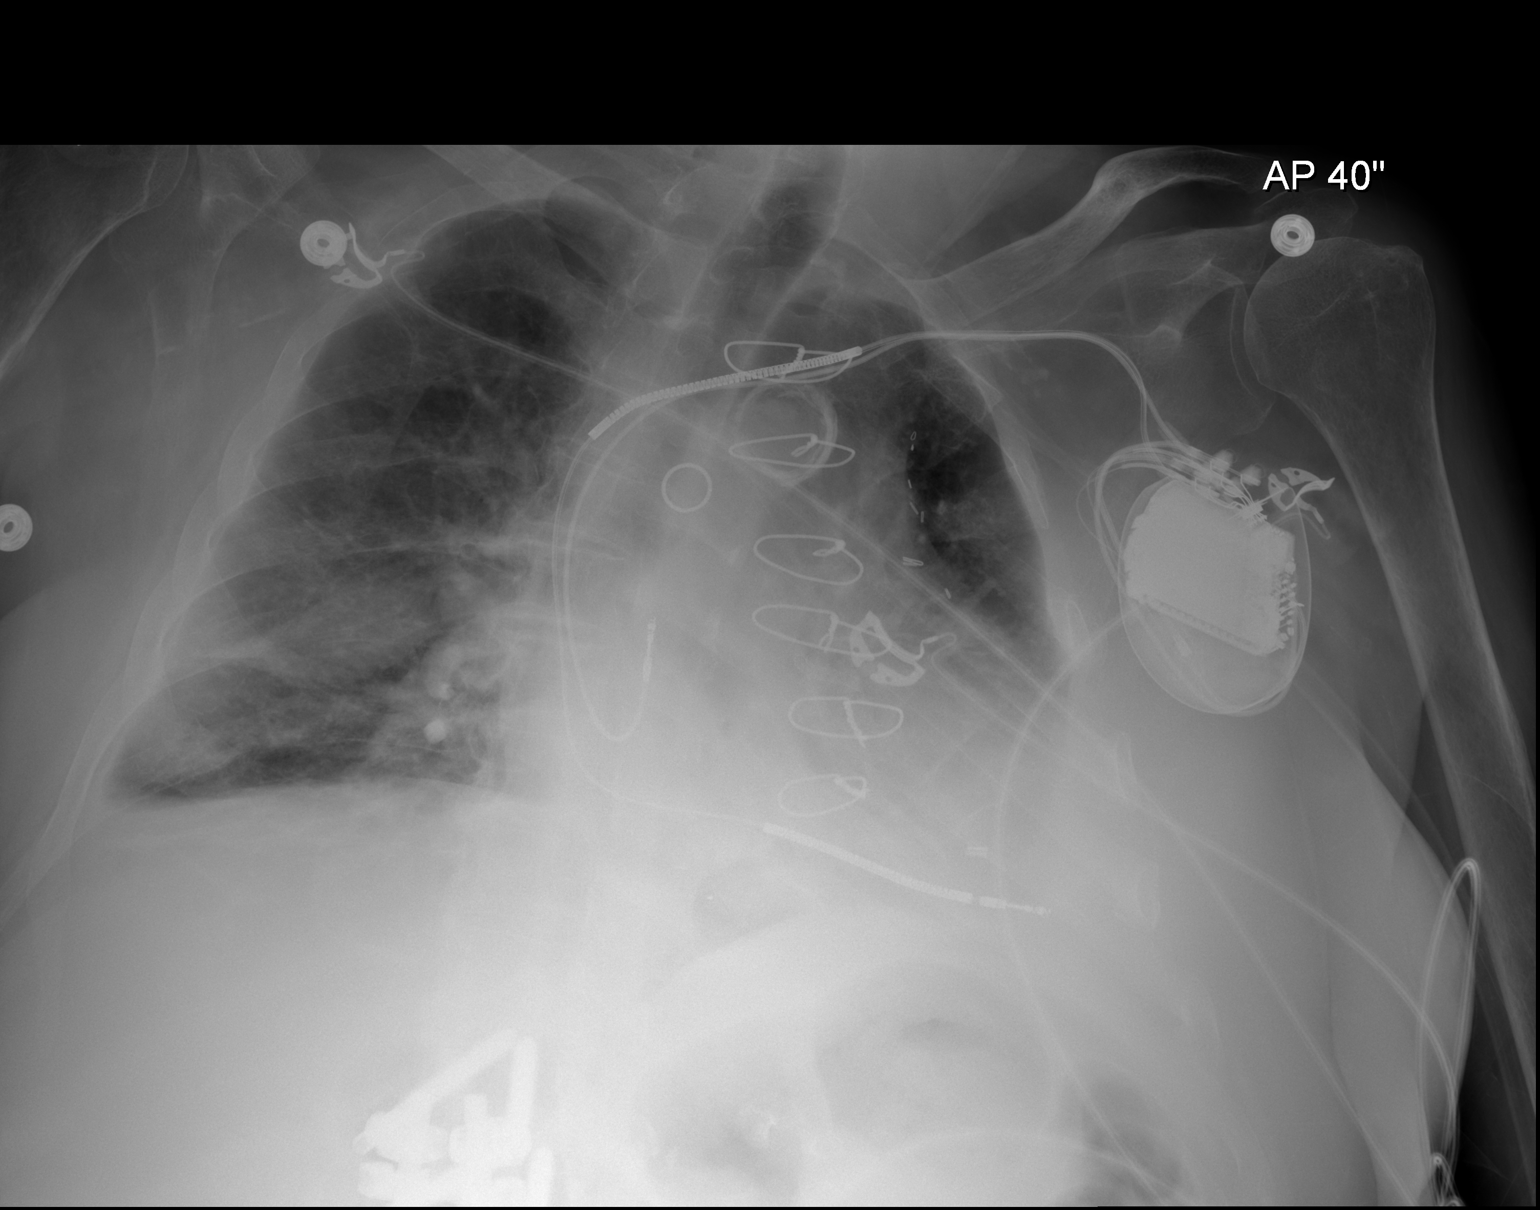

[2 of 2 positions shown; findings below may reference images not displayed]

FINDINGS: Grossly unchanged enlarged cardiac silhouette and mediastinal
contours given persistently reduced lung volumes.  Post median
sternotomy and CABG. Stable positioning of support apparatus. The
pulmonary vasculature remains indistinct with cephalization of
flow.  A small amount of pleural fluid is seen layering within the
posterior aspect of the right minor fissure.  Suspected minimal
worsening of small bilateral effusions, left greater than right.
Worsening bibasilar opacities, left greater than right.  No
pneumothorax.  Grossly unchanged bones including lower lumbar
paraspinal fusion.
IMPRESSION: Grossly unchanged findings of pulmonary edema with suspected slight
increase in size of small bilateral pleural effusions and
associated bibasilar opacities, left greater than right, likely
atelectasis.

## 2014-10-25 ENCOUNTER — Encounter (HOSPITAL_COMMUNITY): Payer: Self-pay | Admitting: *Deleted

## 2014-10-25 ENCOUNTER — Inpatient Hospital Stay (HOSPITAL_COMMUNITY)
Admission: AD | Admit: 2014-10-25 | Discharge: 2014-10-29 | DRG: 193 | Disposition: A | Discharge status: Skilled Nursing Facility | Payer: PRIVATE HEALTH INSURANCE | Source: Other Acute Inpatient Hospital | Attending: Internal Medicine | Admitting: Internal Medicine

## 2014-10-25 DIAGNOSIS — I480 Paroxysmal atrial fibrillation: Secondary | ICD-10-CM | POA: Diagnosis not present

## 2014-10-25 DIAGNOSIS — R509 Fever, unspecified: Secondary | ICD-10-CM | POA: Diagnosis present

## 2014-10-25 DIAGNOSIS — Z951 Presence of aortocoronary bypass graft: Secondary | ICD-10-CM | POA: Diagnosis not present

## 2014-10-25 DIAGNOSIS — J189 Pneumonia, unspecified organism: Principal | ICD-10-CM | POA: Diagnosis present

## 2014-10-25 DIAGNOSIS — Z880 Allergy status to penicillin: Secondary | ICD-10-CM | POA: Diagnosis not present

## 2014-10-25 DIAGNOSIS — N2581 Secondary hyperparathyroidism of renal origin: Secondary | ICD-10-CM | POA: Diagnosis not present

## 2014-10-25 DIAGNOSIS — Z888 Allergy status to other drugs, medicaments and biological substances status: Secondary | ICD-10-CM | POA: Diagnosis not present

## 2014-10-25 DIAGNOSIS — Z992 Dependence on renal dialysis: Secondary | ICD-10-CM | POA: Diagnosis not present

## 2014-10-25 DIAGNOSIS — Z8673 Personal history of transient ischemic attack (TIA), and cerebral infarction without residual deficits: Secondary | ICD-10-CM

## 2014-10-25 DIAGNOSIS — N186 End stage renal disease: Secondary | ICD-10-CM | POA: Diagnosis present

## 2014-10-25 DIAGNOSIS — Z7982 Long term (current) use of aspirin: Secondary | ICD-10-CM

## 2014-10-25 DIAGNOSIS — I120 Hypertensive chronic kidney disease with stage 5 chronic kidney disease or end stage renal disease: Secondary | ICD-10-CM | POA: Diagnosis present

## 2014-10-25 DIAGNOSIS — Z9842 Cataract extraction status, left eye: Secondary | ICD-10-CM

## 2014-10-25 DIAGNOSIS — E119 Type 2 diabetes mellitus without complications: Secondary | ICD-10-CM

## 2014-10-25 DIAGNOSIS — I2510 Atherosclerotic heart disease of native coronary artery without angina pectoris: Secondary | ICD-10-CM | POA: Diagnosis present

## 2014-10-25 DIAGNOSIS — N400 Enlarged prostate without lower urinary tract symptoms: Secondary | ICD-10-CM | POA: Diagnosis present

## 2014-10-25 DIAGNOSIS — J90 Pleural effusion, not elsewhere classified: Secondary | ICD-10-CM

## 2014-10-25 DIAGNOSIS — E785 Hyperlipidemia, unspecified: Secondary | ICD-10-CM | POA: Diagnosis not present

## 2014-10-25 DIAGNOSIS — D649 Anemia, unspecified: Secondary | ICD-10-CM | POA: Diagnosis not present

## 2014-10-25 DIAGNOSIS — Y95 Nosocomial condition: Secondary | ICD-10-CM | POA: Diagnosis not present

## 2014-10-25 DIAGNOSIS — E1140 Type 2 diabetes mellitus with diabetic neuropathy, unspecified: Secondary | ICD-10-CM | POA: Diagnosis not present

## 2014-10-25 DIAGNOSIS — Z7901 Long term (current) use of anticoagulants: Secondary | ICD-10-CM | POA: Diagnosis not present

## 2014-10-25 DIAGNOSIS — Z794 Long term (current) use of insulin: Secondary | ICD-10-CM

## 2014-10-25 DIAGNOSIS — I252 Old myocardial infarction: Secondary | ICD-10-CM | POA: Diagnosis not present

## 2014-10-25 DIAGNOSIS — Z961 Presence of intraocular lens: Secondary | ICD-10-CM | POA: Diagnosis present

## 2014-10-25 DIAGNOSIS — Z85820 Personal history of malignant melanoma of skin: Secondary | ICD-10-CM | POA: Diagnosis not present

## 2014-10-25 DIAGNOSIS — Z9841 Cataract extraction status, right eye: Secondary | ICD-10-CM | POA: Diagnosis not present

## 2014-10-25 DIAGNOSIS — Z89619 Acquired absence of unspecified leg above knee: Secondary | ICD-10-CM

## 2014-10-25 DIAGNOSIS — G629 Polyneuropathy, unspecified: Secondary | ICD-10-CM | POA: Diagnosis not present

## 2014-10-25 DIAGNOSIS — G2581 Restless legs syndrome: Secondary | ICD-10-CM | POA: Diagnosis not present

## 2014-10-25 DIAGNOSIS — G9341 Metabolic encephalopathy: Secondary | ICD-10-CM | POA: Diagnosis not present

## 2014-10-25 DIAGNOSIS — Z955 Presence of coronary angioplasty implant and graft: Secondary | ICD-10-CM | POA: Diagnosis not present

## 2014-10-25 DIAGNOSIS — Z885 Allergy status to narcotic agent status: Secondary | ICD-10-CM | POA: Diagnosis not present

## 2014-10-25 DIAGNOSIS — I739 Peripheral vascular disease, unspecified: Secondary | ICD-10-CM | POA: Diagnosis present

## 2014-10-25 DIAGNOSIS — Z89611 Acquired absence of right leg above knee: Secondary | ICD-10-CM | POA: Diagnosis not present

## 2014-10-25 LAB — GLUCOSE, CAPILLARY: GLUCOSE-CAPILLARY: 184 mg/dL — AB (ref 70–99)

## 2014-10-25 LAB — MRSA PCR SCREENING: MRSA by PCR: NEGATIVE

## 2014-10-25 MED ORDER — BISACODYL 10 MG RE SUPP: 10.0000 mg | RECTAL | Status: DC | PRN

## 2014-10-25 MED ORDER — FAMOTIDINE 20 MG PO TABS
20.0000 mg | ORAL_TABLET | Freq: Every day | ORAL | Status: DC
  Administered 2014-10-26 – 2014-10-29 (×3): 20 mg via ORAL
  Filled 2014-10-25 (×4): qty 1

## 2014-10-25 MED ORDER — INSULIN ASPART 100 UNIT/ML ~~LOC~~ SOLN
0.0000 [IU] | Freq: Three times a day (TID) | SUBCUTANEOUS | Status: DC
  Administered 2014-10-26 – 2014-10-27 (×2): 2 [IU] via SUBCUTANEOUS

## 2014-10-25 MED ORDER — IPRATROPIUM-ALBUTEROL 0.5-2.5 (3) MG/3ML IN SOLN
3.0000 mL | Freq: Four times a day (QID) | RESPIRATORY_TRACT | Status: DC
Start: 2014-10-25 — End: 2014-10-27
  Administered 2014-10-25 – 2014-10-26 (×4): 3 mL via RESPIRATORY_TRACT
  Filled 2014-10-25 (×5): qty 3

## 2014-10-25 MED ORDER — WARFARIN - PHARMACIST DOSING INPATIENT: Freq: Every day | Status: DC

## 2014-10-25 MED ORDER — CARVEDILOL 3.125 MG PO TABS
3.1250 mg | ORAL_TABLET | Freq: Two times a day (BID) | ORAL | Status: DC
  Administered 2014-10-25 – 2014-10-29 (×6): 3.125 mg via ORAL
  Filled 2014-10-25 (×10): qty 1

## 2014-10-25 MED ORDER — ONDANSETRON HCL 4 MG/2ML IJ SOLN: 4.0000 mg | Freq: Four times a day (QID) | INTRAMUSCULAR | Status: DC | PRN

## 2014-10-25 MED ORDER — WARFARIN SODIUM 3 MG PO TABS
3.0000 mg | ORAL_TABLET | Freq: Once | ORAL | Status: AC
Start: 2014-10-25 — End: 2014-10-25
  Administered 2014-10-25: 3 mg via ORAL
  Filled 2014-10-25: qty 1

## 2014-10-25 MED ORDER — MIDODRINE HCL 5 MG PO TABS
10.0000 mg | ORAL_TABLET | Freq: Three times a day (TID) | ORAL | Status: DC
  Administered 2014-10-25 – 2014-10-29 (×11): 10 mg via ORAL
  Filled 2014-10-25 (×15): qty 2

## 2014-10-25 MED ORDER — ONDANSETRON HCL 4 MG PO TABS: 4.0000 mg | ORAL_TABLET | Freq: Four times a day (QID) | ORAL | Status: DC | PRN

## 2014-10-25 MED ORDER — PANTOPRAZOLE SODIUM 40 MG PO TBEC
40.0000 mg | DELAYED_RELEASE_TABLET | Freq: Every day | ORAL | Status: DC
  Administered 2014-10-26 – 2014-10-29 (×3): 40 mg via ORAL
  Filled 2014-10-25 (×4): qty 1

## 2014-10-25 MED ORDER — ACETAMINOPHEN 650 MG RE SUPP: 650.0000 mg | Freq: Four times a day (QID) | RECTAL | Status: DC | PRN

## 2014-10-25 MED ORDER — LEVOTHYROXINE SODIUM 75 MCG PO TABS
75.0000 ug | ORAL_TABLET | Freq: Every day | ORAL | Status: DC
  Administered 2014-10-26 – 2014-10-29 (×3): 75 ug via ORAL
  Filled 2014-10-25 (×5): qty 1

## 2014-10-25 MED ORDER — INSULIN GLARGINE 100 UNIT/ML ~~LOC~~ SOLN
30.0000 [IU] | Freq: Every day | SUBCUTANEOUS | Status: DC
  Administered 2014-10-25 – 2014-10-26 (×2): 30 [IU] via SUBCUTANEOUS
  Filled 2014-10-25 (×3): qty 0.3

## 2014-10-25 MED ORDER — GABAPENTIN 300 MG PO CAPS
300.0000 mg | ORAL_CAPSULE | Freq: Every day | ORAL | Status: DC
  Administered 2014-10-26 – 2014-10-29 (×3): 300 mg via ORAL
  Filled 2014-10-25 (×4): qty 1

## 2014-10-25 MED ORDER — ACETAMINOPHEN 325 MG PO TABS: 650.0000 mg | ORAL_TABLET | Freq: Four times a day (QID) | ORAL | Status: DC | PRN

## 2014-10-25 MED ORDER — FINASTERIDE 5 MG PO TABS
5.0000 mg | ORAL_TABLET | Freq: Every evening | ORAL | Status: DC
  Administered 2014-10-25 – 2014-10-29 (×4): 5 mg via ORAL
  Filled 2014-10-25 (×5): qty 1

## 2014-10-25 MED ORDER — CALCIUM ACETATE 667 MG PO CAPS
1334.0000 mg | ORAL_CAPSULE | Freq: Three times a day (TID) | ORAL | Status: DC
  Administered 2014-10-26 – 2014-10-29 (×7): 1334 mg via ORAL
  Filled 2014-10-25 (×13): qty 2

## 2014-10-25 MED ORDER — VANCOMYCIN HCL 10 G IV SOLR
1750.0000 mg | Freq: Once | INTRAVENOUS | Status: AC
  Administered 2014-10-25: 1750 mg via INTRAVENOUS
  Filled 2014-10-25: qty 1750

## 2014-10-25 MED ORDER — ASPIRIN EC 81 MG PO TBEC
81.0000 mg | DELAYED_RELEASE_TABLET | Freq: Every day | ORAL | Status: DC
  Administered 2014-10-25 – 2014-10-29 (×4): 81 mg via ORAL
  Filled 2014-10-25 (×5): qty 1
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
## 2014-10-26 ENCOUNTER — Inpatient Hospital Stay (HOSPITAL_COMMUNITY): Payer: PRIVATE HEALTH INSURANCE

## 2014-10-26 ENCOUNTER — Encounter (HOSPITAL_COMMUNITY): Payer: Self-pay | Admitting: Nephrology

## 2014-10-26 LAB — CBC
HCT: 37.3 % — ABNORMAL LOW (ref 39.0–52.0)
HEMOGLOBIN: 11.1 g/dL — AB (ref 13.0–17.0)
MCH: 31.5 pg (ref 26.0–34.0)
MCHC: 29.8 g/dL — ABNORMAL LOW (ref 30.0–36.0)
MCV: 106 fL — AB (ref 78.0–100.0)
Platelets: 189 10*3/uL (ref 150–400)
RBC: 3.52 MIL/uL — ABNORMAL LOW (ref 4.22–5.81)
RDW: 16.5 % — ABNORMAL HIGH (ref 11.5–15.5)
WBC: 9.1 10*3/uL (ref 4.0–10.5)

## 2014-10-26 LAB — PROTIME-INR
INR: 3.25 — ABNORMAL HIGH (ref 0.00–1.49)
Prothrombin Time: 33.4 seconds — ABNORMAL HIGH (ref 11.6–15.2)

## 2014-10-26 LAB — BASIC METABOLIC PANEL
Anion gap: 20 — ABNORMAL HIGH (ref 5–15)
BUN: 59 mg/dL — AB (ref 6–23)
CALCIUM: 8.4 mg/dL (ref 8.4–10.5)
CO2: 21 mEq/L (ref 19–32)
CREATININE: 4.99 mg/dL — AB (ref 0.50–1.35)
Chloride: 98 mEq/L (ref 96–112)
GFR calc Af Amer: 12 mL/min — ABNORMAL LOW (ref >90)
GFR, EST NON AFRICAN AMERICAN: 10 mL/min — AB (ref >90)
GLUCOSE: 158 mg/dL — AB (ref 70–99)
POTASSIUM: 4.1 meq/L (ref 3.7–5.3)
Sodium: 139 mEq/L (ref 137–147)

## 2014-10-26 LAB — GLUCOSE, CAPILLARY
GLUCOSE-CAPILLARY: 114 mg/dL — AB (ref 70–99)
Glucose-Capillary: 159 mg/dL — ABNORMAL HIGH (ref 70–99)
Glucose-Capillary: 112 mg/dL — ABNORMAL HIGH (ref 70–99)
Glucose-Capillary: 102 mg/dL — ABNORMAL HIGH (ref 70–99)

## 2014-10-26 LAB — MAGNESIUM: Magnesium: 2 mg/dL (ref 1.5–2.5)

## 2014-10-26 MED ORDER — DEXTROSE 5 % IV SOLN
2.0000 g | INTRAVENOUS | Status: DC
  Administered 2014-10-26: 2 g via INTRAVENOUS
  Filled 2014-10-26: qty 2

## 2014-10-26 MED ORDER — RENA-VITE PO TABS
1.0000 | ORAL_TABLET | Freq: Every day | ORAL | Status: DC
  Administered 2014-10-26 – 2014-10-29 (×2): 1 via ORAL
  Filled 2014-10-26 (×5): qty 1

## 2014-10-26 MED ORDER — VANCOMYCIN HCL IN DEXTROSE 750-5 MG/150ML-% IV SOLN
750.0000 mg | INTRAVENOUS | Status: DC
  Administered 2014-10-26: 750 mg via INTRAVENOUS
  Filled 2014-10-26: qty 150

## 2014-10-26 MED ORDER — CETYLPYRIDINIUM CHLORIDE 0.05 % MT LIQD
7.0000 mL | Freq: Two times a day (BID) | OROMUCOSAL | Status: DC
  Administered 2014-10-26 – 2014-10-29 (×6): 7 mL via OROMUCOSAL

## 2014-10-26 MED ORDER — VANCOMYCIN HCL IN DEXTROSE 750-5 MG/150ML-% IV SOLN
750.0000 mg | INTRAVENOUS | Status: DC | PRN
  Filled 2014-10-26: qty 150

## 2014-10-26 NOTE — Progress Notes (Signed)
[facility] PROGRESS NOTE  [name] [name] UVO:[id number] DOB: 06/01/1934 DOA: 10/25/2014 PCP: Annamarie Major, MD  Assessment/Plan: 1. Fever/suspected HCAP -Continue IV VAnc and Cefepime -repeat CXR today -sputum Cx -FU blood Cx drawn in [place] ER -Swallow eval   2. ESRD on HD -only completed half of HD yesterday -Renal following, notified Dr.Patel  3. CAD, s/p CABG/ICD -stable, continue ASA/coreg  4. S/P AKA  5. DM -continue lantus at lower dose, SSI  6. P.AFib  -warfarin per pharmacy, continue coreg  Code Status: Full code DVT Prophylaxis: on warfarin Family Communication: d/w extended family at bedside Disposition Plan: Tx to floor    Antibiotics:  Vanc/cefepime  HPI/Subjective: Doing ok, no complaints  Objective: Filed Vitals:   10/26/14 1310  BP: 104/51  Pulse: 85  Temp: 97.8 F (36.6 C)  Resp: 18    Intake/Output Summary (Last 24 hours) at 10/26/14 1318 Last data filed at 10/25/14 2236  Gross per 24 hour  Intake    820 ml  Output      0 ml  Net    820 ml   Filed Weights   10/25/14 1400 10/26/14 0400 10/26/14 1310  Weight: 75.6 kg (166 lb 10.7 oz) 79.4 kg (175 lb 0.7 oz) 78.4 kg (172 lb 13.5 oz)    Exam:   General:  AAOx3, chronically ill appearing  Cardiovascular: S1S2/RRR  Respiratory: CTAB  Abdomen: soft, NT, BS present  Musculoskeletal: no edema c/c , R AKA  Data Reviewed: Basic Metabolic Panel:  Recent Labs Lab 10/26/14 0443  NA 139  K 4.1  CL 98  CO2 21  GLUCOSE 158*  BUN 59*  CREATININE 4.99*  CALCIUM 8.4  MG 2.0   Liver Function Tests: No results for input(s): AST, ALT, ALKPHOS, BILITOT, PROT, ALBUMIN in the last 168 hours. No results for input(s): LIPASE, AMYLASE in the last 168 hours. No results for input(s): AMMONIA in the last 168 hours. CBC:  Recent Labs Lab 10/26/14 0443  WBC 9.1  HGB 11.1*  HCT 37.3*  MCV 106.0*  PLT 189   Cardiac Enzymes: No results for input(s): CKTOTAL, CKMB,  CKMBINDEX, TROPONINI in the last 168 hours. BNP (last 3 results) No results for input(s): PROBNP in the last 8760 hours. CBG:  Recent Labs Lab 10/25/14 2156 10/26/14 0813 10/26/14 1241  GLUCAP 184* 159* 114*    Recent Results (from the past 240 hour(s))  MRSA PCR Screening     Status: None   Collection Time: 10/25/14  2:26 PM  Result Value Ref Range Status   MRSA by PCR NEGATIVE NEGATIVE Final    Comment:        The GeneXpert MRSA Assay (FDA approved for NASAL specimens only), is one component of a comprehensive MRSA colonization surveillance program. It is not intended to diagnose MRSA infection nor to guide or monitor treatment for MRSA infections.      Studies: Dg Chest 2 View  10/26/2014   CLINICAL DATA:  Chest congestion with shortness of breath and weakness. Fever.  EXAM: CHEST  2 VIEW  COMPARISON:  10/25/2014  FINDINGS: Sequelae of prior CABG are again identified. Left-sided dual lead pacemaker/ ICD remains in place. Cardiac silhouette remains enlarged. Thoracic aortic calcification is noted. The lungs are slightly better inflated than on the prior study with decreased pulmonary vascular congestion. There is increased, confluent consolidation in the right lung base. Left basilar opacity persists but is slightly improved. Small bilateral pleural effusions are questioned. No pneumothorax is identified.  IMPRESSION: 1.  Increased right basilar consolidation, suspicious for pneumonia. 2. Slightly decreased pulmonary vascular congestion.   Electronically Signed   By: Logan Bores   On: 10/26/2014 08:53    Scheduled Meds: . antiseptic oral rinse  7 mL Mouth Rinse BID  . aspirin EC  81 mg Oral Daily  . calcium acetate  1,334 mg Oral TID WC  . carvedilol  3.125 mg Oral BID WC  . famotidine  20 mg Oral Daily  . finasteride  5 mg Oral QPM  . gabapentin  300 mg Oral Daily  . insulin aspart  0-9 Units Subcutaneous TID WC  . insulin glargine  30 Units Subcutaneous QHS  .  ipratropium-albuterol  3 mL Nebulization Q6H  . levothyroxine  75 mcg Oral QAC breakfast  . midodrine  10 mg Oral TID WC  . multivitamin  1 tablet Oral QHS  . pantoprazole  40 mg Oral Daily  . Warfarin - Pharmacist Dosing Inpatient   Does not apply q1800   Continuous Infusions:  Antibiotics Given (last 72 hours)    Date/Time Action Medication Dose Rate   10/25/14 1826 Given   vancomycin (VANCOCIN) 1,750 mg in sodium chloride 0.9 % 500 mL IVPB 1,750 mg 250 mL/hr      Active Problems:   ESRD on hemodialysis   DM2 (diabetes mellitus, type 2)   S/P AKA (above knee amputation)   S/P CABG (coronary artery bypass graft)   HCAP (healthcare-associated pneumonia)   Fever    Time spent: 37min    Leemon Ayala  [facility] Pager [phone]. If 7PM-7AM, please contact night-coverage at [email or web address], password [facility] 10/26/2014, 1:18 PM  LOS: 1 day
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
## 2014-10-26 NOTE — Progress Notes (Signed)
Pt's blood pressure has been consistently in the 80s (SBP). The last was 85/42. Pt is easily arousable, mentation appears to be at baseline, denies and discomfort.  Notified M. Lynch NP.  Advised to monitor and call if BP drops or if pt becomes symptomatic. Pt appears to be resting comfortably at this time.

## 2014-10-26 NOTE — Progress Notes (Signed)
ANTICOAGULATION CONSULT NOTE - Follow Up Consult  Pharmacy Consult for Warfarin Indication: atrial fibrillation  Allergies  Allergen Reactions  . Morphine And Related Other (See Comments)    "makes me hallucinate"  . Cox-2 Inhibitors Other (See Comments)    UNKNOWN  . Nsaids Other (See Comments)    "don't really know"  . Penicillins Rash    AMOXICILLIN SAME REACTION    Patient Measurements: Height: 5\' 7"  (170.2 cm) Weight: 172 lb 13.5 oz (78.4 kg) IBW/kg (Calculated) : 66.1  Vital Signs: Temp: 97.8 F (36.6 C) (11/21 1310) Temp Source: Oral (11/21 1310) BP: 88/55 mmHg (11/21 1319) Pulse Rate: 79 (11/21 1323)  Labs:  Recent Labs  10/26/14 0443  HGB 11.1*  HCT 37.3*  PLT 189  LABPROT 33.4*  INR 3.25*  CREATININE 4.99*    Estimated Creatinine Clearance: 11.4 mL/min (by C-G formula based on Cr of 4.99).   Medications:  Prescriptions prior to admission  Medication Sig Dispense Refill Last Dose  . budesonide-formoterol (SYMBICORT) 160-4.5 MCG/ACT inhaler Inhale 2 puffs into the lungs 2 (two) times daily.   10/25/2014 at Unknown time  . calcium acetate (PHOSLO) 667 MG capsule Take 667 mg by mouth 3 (three) times daily with meals.    10/25/2014 at Unknown time  . carvedilol (COREG) 3.125 MG tablet Take 1 tablet (3.125 mg total) by mouth 2 (two) times daily with a meal. (Patient taking differently: Take 3.125 mg by mouth at bedtime. ) 30 tablet 0 10/23/2014 at 2200  . Cholecalciferol 50000 UNITS capsule Take 50,000 Units by mouth every 30 (thirty) days.   10/06/14  . finasteride (PROSCAR) 5 MG tablet Take 5 mg by mouth every evening.    10/23/2014 at Unknown time  . gabapentin (NEURONTIN) 300 MG capsule Take 300 mg by mouth at bedtime.   10/23/2014 at Unknown time  . insulin glargine (LANTUS) 100 UNIT/ML injection Inject 32 Units into the skin at bedtime.    10/23/2014 at Unknown time  . insulin regular (NOVOLIN R,HUMULIN R) 100 units/mL injection Inject 2-8 Units into  the skin 3 (three) times daily before meals. SSI:  CBG 200-250= 2 units; 251-300= 4 units; 301-350= 6 units; 351-400= 8 units; > 400= call MD   10/25/2014 at Unknown time  . ipratropium-albuterol (DUONEB) 0.5-2.5 (3) MG/3ML SOLN Take 3 mLs by nebulization every 6 (six) hours. Respiratory distress   10/25/2014 at Unknown time  . levothyroxine (SYNTHROID, LEVOTHROID) 75 MCG tablet Take 75 mcg by mouth at bedtime.   10/23/2014 at Unknown time  . loratadine (CLARITIN) 10 MG tablet Take 10 mg by mouth daily.   10/24/2014 at Unknown time  . multivitamin (RENA-VIT) TABS tablet Take 1 tablet by mouth at bedtime.   10/23/2014 at Unknown time  . pantoprazole (PROTONIX) 40 MG tablet Take 40 mg by mouth daily.    10/25/2014 at Unknown time  . warfarin (COUMADIN) 2 MG tablet Take 1 tablet (2 mg total) by mouth daily. (Patient taking differently: Take 3-3.5 mg by mouth See admin instructions. TAKES 3MG  ON SUN, MON, WED, FRI, SAT  TAKES 3.5MG  ON TUES, THURS)   10/24/2014 at Unknown time   Scheduled:  . antiseptic oral rinse  7 mL Mouth Rinse BID  . aspirin EC  81 mg Oral Daily  . calcium acetate  1,334 mg Oral TID WC  . carvedilol  3.125 mg Oral BID WC  . famotidine  20 mg Oral Daily  . finasteride  5 mg Oral QPM  . gabapentin  300 mg Oral Daily  . insulin aspart  0-9 Units Subcutaneous TID WC  . insulin glargine  30 Units Subcutaneous QHS  . ipratropium-albuterol  3 mL Nebulization Q6H  . levothyroxine  75 mcg Oral QAC breakfast  . midodrine  10 mg Oral TID WC  . multivitamin  1 tablet Oral QHS  . pantoprazole  40 mg Oral Daily  . Warfarin - Pharmacist Dosing Inpatient   Does not apply q1800   Assessment: 80yo male presented with fever to [facility] ED from SNF in [place]. INR was drawn at [facility] as well and was therapeutic at 2.9. Patient's home dose per SNF MAR is 3mg  daily except 3.5mg  on Tuesdays and Thursdays. Labs from [place]: Hgb 12.3, plts 157. Pt received 3mg  last night. This morning's INR is  supratherapeutic at 3.25. H/H is 11.1/37.3 and Plt 189.  Goal of Therapy:  INR 2-3 Monitor platelets by anticoagulation protocol: Yes   Plan:  Hold warfarin tonight x1 Daily INR Continue to monitor H&H and platelets and s/sx of bleeding  Andrey Cota. Diona Foley, PharmD Clinical Pharmacist Pager [phone] 10/26/2014,1:41 PM
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
## 2014-10-26 NOTE — Progress Notes (Signed)
Nutrition Brief Note  Patient identified on the Malnutrition Screening Tool (MST) Report  Wt Readings from Last 15 Encounters:  10/26/14 175 lb 0.7 oz (79.4 kg)  08/11/13 154 lb 15.7 oz (70.3 kg)  08/01/13 154 lb 12.2 oz (70.2 kg)  07/05/13 159 lb 2.8 oz (72.2 kg)  06/01/12 158 lb (71.668 kg)  05/24/12 158 lb (71.668 kg)  04/10/12 158 lb 4.6 oz (71.8 kg)  03/02/12 169 lb 8 oz (76.885 kg)  12/09/11 144 lb (65.318 kg)   [name] [name] is a 80 y.o. male with PMH of ESRD on HD MWF, CAD/ICM/ICD, s/p CABG, DM on Insulin, P.Afib on warfarin, COPD, R AKA, resident of SNF at [place] was at HD today when he started running a fever of 102 with some lethargy and decreased responsiveness and was transferred to [facility] ER.   Chart review reveals wt gain trend over the past year. He has a good appetite is and is consuming all of his meals.  Body mass index is 27.41 kg/(m^2). Patient meets criteria for overweight based on current BMI.   Current diet order is renal/carb modified with 1200 ml fluid restriction, patient is consuming approximately 100% of meals at this time. Labs and medications reviewed.   No nutrition interventions warranted at this time. If nutrition issues arise, please consult RD.   Aniruddh Ciavarella A. Jimmye Norman, RD, LDN Pager: [phone] After hours Pager: [phone]

## 2014-10-26 NOTE — Plan of Care (Signed)
Problem: Consults Goal: General Medical Patient Education See Patient Education Module for specific education. Outcome: Progressing Goal: Skin Care Protocol Initiated - if Braden Score 18 or less If consults are not indicated, leave blank or document N/A Outcome: Completed/Met Date Met:  10/26/14

## 2014-10-26 NOTE — Plan of Care (Signed)
Problem: Phase I Progression Outcomes Goal: Pain controlled with appropriate interventions Outcome: Completed/Met Date Met:  10/26/14 Goal: OOB as tolerated unless otherwise ordered Outcome: Progressing Goal: Initial discharge plan identified Outcome: Progressing

## 2014-10-26 NOTE — Procedures (Signed)
I was present at this dialysis session, have reviewed the session itself and made  appropriate changes  Kelly Splinter MD (pgr) [phone]    ([phone] 10/26/2014, 5:13 PM

## 2014-10-27 LAB — CBC
HCT: 38.6 % — ABNORMAL LOW (ref 39.0–52.0)
HEMOGLOBIN: 11.5 g/dL — AB (ref 13.0–17.0)
MCH: 31.5 pg (ref 26.0–34.0)
MCHC: 29.8 g/dL — ABNORMAL LOW (ref 30.0–36.0)
MCV: 105.8 fL — ABNORMAL HIGH (ref 78.0–100.0)
Platelets: 252 10*3/uL (ref 150–400)
RBC: 3.65 MIL/uL — ABNORMAL LOW (ref 4.22–5.81)
RDW: 16.7 % — ABNORMAL HIGH (ref 11.5–15.5)
WBC: 9.9 10*3/uL (ref 4.0–10.5)

## 2014-10-27 LAB — RENAL FUNCTION PANEL
ANION GAP: 18 — AB (ref 5–15)
Albumin: 2.5 g/dL — ABNORMAL LOW (ref 3.5–5.2)
BUN: 35 mg/dL — AB (ref 6–23)
CO2: 25 mEq/L (ref 19–32)
Calcium: 8.7 mg/dL (ref 8.4–10.5)
Chloride: 94 mEq/L — ABNORMAL LOW (ref 96–112)
Creatinine, Ser: 3.43 mg/dL — ABNORMAL HIGH (ref 0.50–1.35)
GFR calc Af Amer: 18 mL/min — ABNORMAL LOW (ref >90)
GFR calc non Af Amer: 16 mL/min — ABNORMAL LOW (ref >90)
GLUCOSE: 50 mg/dL — AB (ref 70–99)
POTASSIUM: 3.5 meq/L — AB (ref 3.7–5.3)
Phosphorus: 4.3 mg/dL (ref 2.3–4.6)
Sodium: 137 mEq/L (ref 137–147)

## 2014-10-27 LAB — PROTIME-INR
INR: 3.34 — ABNORMAL HIGH (ref 0.00–1.49)
PROTHROMBIN TIME: 34.1 s — AB (ref 11.6–15.2)

## 2014-10-27 LAB — GLUCOSE, CAPILLARY
GLUCOSE-CAPILLARY: 99 mg/dL (ref 70–99)
Glucose-Capillary: 61 mg/dL — ABNORMAL LOW (ref 70–99)
Glucose-Capillary: 135 mg/dL — ABNORMAL HIGH (ref 70–99)
Glucose-Capillary: 118 mg/dL — ABNORMAL HIGH (ref 70–99)

## 2014-10-27 MED ORDER — VANCOMYCIN HCL IN DEXTROSE 750-5 MG/150ML-% IV SOLN: 750.0000 mg | INTRAVENOUS | Status: DC

## 2014-10-27 MED ORDER — PENTAFLUOROPROP-TETRAFLUOROETH EX AERO: 1.0000 "application " | INHALATION_SPRAY | CUTANEOUS | Status: DC | PRN

## 2014-10-27 MED ORDER — LIDOCAINE-PRILOCAINE 2.5-2.5 % EX CREA: 1.0000 "application " | TOPICAL_CREAM | CUTANEOUS | Status: DC | PRN

## 2014-10-27 MED ORDER — ZOLPIDEM TARTRATE 5 MG PO TABS
5.0000 mg | ORAL_TABLET | Freq: Once | ORAL | Status: AC
  Administered 2014-10-27: 5 mg via ORAL
  Filled 2014-10-27: qty 1

## 2014-10-27 MED ORDER — ALTEPLASE 2 MG IJ SOLR
2.0000 mg | Freq: Once | INTRAMUSCULAR | Status: AC | PRN
Start: 2014-10-27 — End: 2014-10-27
  Filled 2014-10-27: qty 2

## 2014-10-27 MED ORDER — DEXTROSE 5 % IV SOLN: 2.0000 g | INTRAVENOUS | Status: DC

## 2014-10-27 MED ORDER — HEPARIN SODIUM (PORCINE) 1000 UNIT/ML DIALYSIS: 1000.0000 [IU] | INTRAMUSCULAR | Status: DC | PRN

## 2014-10-27 MED ORDER — LIDOCAINE HCL (PF) 1 % IJ SOLN: 5.0000 mL | INTRAMUSCULAR | Status: DC | PRN

## 2014-10-27 MED ORDER — CEFEPIME HCL 2 G IJ SOLR: 2.0000 g | INTRAMUSCULAR | Status: DC

## 2014-10-27 MED ORDER — IPRATROPIUM-ALBUTEROL 0.5-2.5 (3) MG/3ML IN SOLN: 3.0000 mL | Freq: Four times a day (QID) | RESPIRATORY_TRACT | Status: DC | PRN

## 2014-10-27 MED ORDER — DEXTROSE 5 % IV SOLN
2.0000 g | Freq: Once | INTRAVENOUS | Status: AC
  Administered 2014-10-27: 2 g via INTRAVENOUS
  Filled 2014-10-27: qty 2

## 2014-10-27 MED ORDER — SODIUM CHLORIDE 0.9 % IV SOLN: 100.0000 mL | INTRAVENOUS | Status: DC | PRN

## 2014-10-27 MED ORDER — HEPARIN SODIUM (PORCINE) 1000 UNIT/ML DIALYSIS
1000.0000 [IU] | INTRAMUSCULAR | Status: DC | PRN
  Filled 2014-10-27: qty 1

## 2014-10-27 MED ORDER — HEPARIN SODIUM (PORCINE) 1000 UNIT/ML DIALYSIS: 2000.0000 [IU] | Freq: Once | INTRAMUSCULAR | Status: DC

## 2014-10-27 MED ORDER — VANCOMYCIN HCL IN DEXTROSE 750-5 MG/150ML-% IV SOLN
750.0000 mg | Freq: Once | INTRAVENOUS | Status: AC
  Administered 2014-10-27: 750 mg via INTRAVENOUS
  Filled 2014-10-27: qty 150

## 2014-10-27 MED ORDER — NEPRO/CARBSTEADY PO LIQD: 237.0000 mL | ORAL | Status: DC | PRN

## 2014-10-27 MED ORDER — INSULIN GLARGINE 100 UNIT/ML ~~LOC~~ SOLN
20.0000 [IU] | Freq: Every day | SUBCUTANEOUS | Status: DC
  Administered 2014-10-27: 20 [IU] via SUBCUTANEOUS
  Filled 2014-10-27 (×2): qty 0.2

## 2014-10-27 MED ORDER — ALTEPLASE 2 MG IJ SOLR: 2.0000 mg | Freq: Once | INTRAMUSCULAR | Status: DC | PRN

## 2014-10-27 MED ORDER — HEPARIN SODIUM (PORCINE) 1000 UNIT/ML DIALYSIS
2000.0000 [IU] | Freq: Once | INTRAMUSCULAR | Status: AC
  Administered 2014-10-29: 2000 [IU] via INTRAVENOUS_CENTRAL
  Filled 2014-10-27: qty 2

## 2014-10-27 MED ORDER — MIDODRINE HCL 5 MG PO TABS
ORAL_TABLET | ORAL | Status: AC
  Administered 2014-10-27: 10 mg via ORAL
  Filled 2014-10-27: qty 2

## 2014-10-27 NOTE — Progress Notes (Signed)
[facility] PROGRESS NOTE  [name] [name] HYI:[id number] DOB: 26-Nov-1934 DOA: 10/25/2014 PCP: Annamarie Major, MD  Assessment/Plan: 1. HCAP -Day 3 of IV VAnc and Cefepime -repeat CXR with RLL consolidation -sputum Cx -requested blood cx results from [facility] ER -Swallow eval completed -change to PO levaquin if cultures negative   2. ESRD on HD -s/p HD 11/21 and 11/22 per holiday schedule -Renal following  3. CAD, s/p CABG/ICD -stable, continue ASA/coreg  4. S/P AKA  5. DM -with hypoglycemia -cut down lantus, SSI  6. P.AFib  -warfarin per pharmacy, continue coreg  Code Status: Full code DVT Prophylaxis: on warfarin Family Communication: d/w extended family at bedside 11/21 Disposition Plan: Tx to floor, home tomorrow    Antibiotics:  Vanc/cefepime  HPI/Subjective: Just back from HD, tired  Objective: Filed Vitals:   10/27/14 1100  BP: 114/54  Pulse: 85  Temp: 98 F (36.7 C)  Resp: 16    Intake/Output Summary (Last 24 hours) at 10/27/14 1119 Last data filed at 10/27/14 1100  Gross per 24 hour  Intake    120 ml  Output   2900 ml  Net  -2780 ml   Filed Weights   10/26/14 1650 10/27/14 0659 10/27/14 1100  Weight: 77.6 kg (171 lb 1.2 oz) 77.4 kg (170 lb 10.2 oz) 75.6 kg (166 lb 10.7 oz)    Exam:   General:  AAOx3, chronically ill appearing  Cardiovascular: S1S2/RRR  Respiratory: crackles at R base  Abdomen: soft, NT, BS present  Musculoskeletal: no edema c/c , R AKA  Data Reviewed: Basic Metabolic Panel:  Recent Labs Lab 10/26/14 0443 10/27/14 0712  NA 139 137  K 4.1 3.5*  CL 98 94*  CO2 21 25  GLUCOSE 158* 50*  BUN 59* 35*  CREATININE 4.99* 3.43*  CALCIUM 8.4 8.7  MG 2.0  --   PHOS  --  4.3   Liver Function Tests:  Recent Labs Lab 10/27/14 0712  ALBUMIN 2.5*   No results for input(s): LIPASE, AMYLASE in the last 168 hours. No results for input(s): AMMONIA in the last 168 hours. CBC:  Recent Labs Lab  10/26/14 0443 10/27/14 0712  WBC 9.1 9.9  HGB 11.1* 11.5*  HCT 37.3* 38.6*  MCV 106.0* 105.8*  PLT 189 252   Cardiac Enzymes: No results for input(s): CKTOTAL, CKMB, CKMBINDEX, TROPONINI in the last 168 hours. BNP (last 3 results) No results for input(s): PROBNP in the last 8760 hours. CBG:  Recent Labs Lab 10/25/14 2156 10/26/14 0813 10/26/14 1241 10/26/14 1845 10/26/14 2123  GLUCAP 184* 159* 114* 112* 102*    Recent Results (from the past 240 hour(s))  MRSA PCR Screening     Status: None   Collection Time: 10/25/14  2:26 PM  Result Value Ref Range Status   MRSA by PCR NEGATIVE NEGATIVE Final    Comment:        The GeneXpert MRSA Assay (FDA approved for NASAL specimens only), is one component of a comprehensive MRSA colonization surveillance program. It is not intended to diagnose MRSA infection nor to guide or monitor treatment for MRSA infections.      Studies: Dg Chest 2 View  10/26/2014   CLINICAL DATA:  Chest congestion with shortness of breath and weakness. Fever.  EXAM: CHEST  2 VIEW  COMPARISON:  10/25/2014  FINDINGS: Sequelae of prior CABG are again identified. Left-sided dual lead pacemaker/ ICD remains in place. Cardiac silhouette remains enlarged. Thoracic aortic calcification is noted. The lungs are slightly better inflated  than on the prior study with decreased pulmonary vascular congestion. There is increased, confluent consolidation in the right lung base. Left basilar opacity persists but is slightly improved. Small bilateral pleural effusions are questioned. No pneumothorax is identified.  IMPRESSION: 1. Increased right basilar consolidation, suspicious for pneumonia. 2. Slightly decreased pulmonary vascular congestion.   Electronically Signed   By: Logan Bores   On: 10/26/2014 08:53    Scheduled Meds: . antiseptic oral rinse  7 mL Mouth Rinse BID  . aspirin EC  81 mg Oral Daily  . calcium acetate  1,334 mg Oral TID WC  . carvedilol  3.125 mg  Oral BID WC  . ceFEPime (MAXIPIME) IV  2 g Intravenous Q M,W,F-1800  . famotidine  20 mg Oral Daily  . finasteride  5 mg Oral QPM  . gabapentin  300 mg Oral Daily  . [START ON 10/28/2014] heparin  2,000 Units Dialysis Once in dialysis  . insulin aspart  0-9 Units Subcutaneous TID WC  . insulin glargine  30 Units Subcutaneous QHS  . levothyroxine  75 mcg Oral QAC breakfast  . midodrine  10 mg Oral TID WC  . multivitamin  1 tablet Oral QHS  . pantoprazole  40 mg Oral Daily  . vancomycin  750 mg Intravenous Q M,W,F-HD  . Warfarin - Pharmacist Dosing Inpatient   Does not apply q1800   Continuous Infusions:  Antibiotics Given (last 72 hours)    Date/Time Action Medication Dose Rate   10/25/14 1826 Given   vancomycin (VANCOCIN) 1,750 mg in sodium chloride 0.9 % 500 mL IVPB 1,750 mg 250 mL/hr   10/26/14 1522 Given   vancomycin (VANCOCIN) IVPB 750 mg/150 ml premix 750 mg 150 mL/hr   10/26/14 1630 Given   ceFEPIme (MAXIPIME) 2 g in dextrose 5 % 50 mL IVPB 2 g 100 mL/hr      Active Problems:   ESRD on hemodialysis   DM2 (diabetes mellitus, type 2)   S/P AKA (above knee amputation)   S/P CABG (coronary artery bypass graft)   HCAP (healthcare-associated pneumonia)   Fever    Time spent: 52min    Tharon Bomar  [facility] Pager [phone]. If 7PM-7AM, please contact night-coverage at [email or web address], password [facility] 10/27/2014, 11:19 AM  LOS: 2 days

## 2014-10-27 NOTE — Plan of Care (Signed)
Problem: Phase I Progression Outcomes Goal: OOB as tolerated unless otherwise ordered Outcome: Progressing Goal: Initial discharge plan identified Outcome: Progressing Goal: Hemodynamically stable Outcome: Progressing

## 2014-10-27 NOTE — Progress Notes (Signed)
ANTICOAGULATION CONSULT NOTE - Follow Up Consult  Pharmacy Consult for Warfarin Indication: atrial fibrillation  Allergies  Allergen Reactions  . Morphine And Related Other (See Comments)    "makes me hallucinate"  . Cox-2 Inhibitors Other (See Comments)    UNKNOWN  . Nsaids Other (See Comments)    "don't really know"  . Penicillins Rash    AMOXICILLIN SAME REACTION    Patient Measurements: Height: 5\' 7"  (170.2 cm) Weight: 166 lb 10.7 oz (75.6 kg) IBW/kg (Calculated) : 66.1  Vital Signs: Temp: 97.5 F (36.4 C) (11/22 1552) Temp Source: Oral (11/22 1552) BP: 131/70 mmHg (11/22 1552) Pulse Rate: 85 (11/22 1552)  Labs:  Recent Labs  10/26/14 0443 10/27/14 0346 10/27/14 0712  HGB 11.1*  --  11.5*  HCT 37.3*  --  38.6*  PLT 189  --  252  LABPROT 33.4* 34.1*  --   INR 3.25* 3.34*  --   CREATININE 4.99*  --  3.43*    Estimated Creatinine Clearance: 16.6 mL/min (by C-G formula based on Cr of 3.43).   Medications:  Prescriptions prior to admission  Medication Sig Dispense Refill Last Dose  . budesonide-formoterol (SYMBICORT) 160-4.5 MCG/ACT inhaler Inhale 2 puffs into the lungs 2 (two) times daily.   10/25/2014 at Unknown time  . calcium acetate (PHOSLO) 667 MG capsule Take 667 mg by mouth 3 (three) times daily with meals.    10/25/2014 at Unknown time  . carvedilol (COREG) 3.125 MG tablet Take 1 tablet (3.125 mg total) by mouth 2 (two) times daily with a meal. (Patient taking differently: Take 3.125 mg by mouth at bedtime. ) 30 tablet 0 10/23/2014 at 2200  . Cholecalciferol 50000 UNITS capsule Take 50,000 Units by mouth every 30 (thirty) days.   10/06/14  . finasteride (PROSCAR) 5 MG tablet Take 5 mg by mouth every evening.    10/23/2014 at Unknown time  . gabapentin (NEURONTIN) 300 MG capsule Take 300 mg by mouth at bedtime.   10/23/2014 at Unknown time  . insulin glargine (LANTUS) 100 UNIT/ML injection Inject 32 Units into the skin at bedtime.    10/23/2014 at Unknown  time  . insulin regular (NOVOLIN R,HUMULIN R) 100 units/mL injection Inject 2-8 Units into the skin 3 (three) times daily before meals. SSI:  CBG 200-250= 2 units; 251-300= 4 units; 301-350= 6 units; 351-400= 8 units; > 400= call MD   10/25/2014 at Unknown time  . ipratropium-albuterol (DUONEB) 0.5-2.5 (3) MG/3ML SOLN Take 3 mLs by nebulization every 6 (six) hours. Respiratory distress   10/25/2014 at Unknown time  . levothyroxine (SYNTHROID, LEVOTHROID) 75 MCG tablet Take 75 mcg by mouth at bedtime.   10/23/2014 at Unknown time  . loratadine (CLARITIN) 10 MG tablet Take 10 mg by mouth daily.   10/24/2014 at Unknown time  . multivitamin (RENA-VIT) TABS tablet Take 1 tablet by mouth at bedtime.   10/23/2014 at Unknown time  . pantoprazole (PROTONIX) 40 MG tablet Take 40 mg by mouth daily.    10/25/2014 at Unknown time  . warfarin (COUMADIN) 2 MG tablet Take 1 tablet (2 mg total) by mouth daily. (Patient taking differently: Take 3-3.5 mg by mouth See admin instructions. TAKES 3MG  ON SUN, MON, WED, FRI, SAT  TAKES 3.5MG  ON TUES, THURS)   10/24/2014 at Unknown time   Scheduled:  . antiseptic oral rinse  7 mL Mouth Rinse BID  . aspirin EC  81 mg Oral Daily  . calcium acetate  1,334 mg Oral TID WC  .  carvedilol  3.125 mg Oral BID WC  . ceFEPime (MAXIPIME) IV  2 g Intravenous Once  . [START ON 10/30/2014] ceFEPime (MAXIPIME) IV  2 g Intravenous Q M,W,F-1800  . famotidine  20 mg Oral Daily  . finasteride  5 mg Oral QPM  . gabapentin  300 mg Oral Daily  . [START ON 10/28/2014] heparin  2,000 Units Dialysis Once in dialysis  . insulin aspart  0-9 Units Subcutaneous TID WC  . insulin glargine  20 Units Subcutaneous QHS  . levothyroxine  75 mcg Oral QAC breakfast  . midodrine  10 mg Oral TID WC  . multivitamin  1 tablet Oral QHS  . pantoprazole  40 mg Oral Daily  . vancomycin  750 mg Intravenous Once  . [START ON 10/30/2014] vancomycin  750 mg Intravenous Q M,W,F-HD  . Warfarin - Pharmacist Dosing  Inpatient   Does not apply q1800   Assessment: 80yo male presented with fever to [facility] ED from SNF in [place]. INR was drawn at [facility] as well and was therapeutic at 2.9. Patient's home dose per SNF MAR is 3mg  daily except 3.5mg  on Tuesdays and Thursdays. Labs from [facility]: Hgb 12.3, plts 157. Pt received 3mg  on 11/20. This morning's INR is supratherapeutic at 3.41 after holding a dose. H/H is 11.5/38.6 and Plt 252.  Goal of Therapy:  INR 2-3 Monitor platelets by anticoagulation protocol: Yes   Plan:  Hold warfarin tonight x1 Daily INR Continue to monitor H&H and platelets and s/sx of bleeding  Andrey Cota. Diona Foley, PharmD Clinical Pharmacist Pager [phone] 10/27/2014,4:36 PM

## 2014-10-27 NOTE — Progress Notes (Signed)
  [facility] Progress Note   Subjective: no complaints  Filed Vitals:   10/27/14 0930 10/27/14 1000 10/27/14 1030 10/27/14 1100  BP: 122/64 126/66 127/59 114/54  Pulse: 82 94 80 85  Temp:    98 F (36.7 C)  TempSrc:    Oral  Resp: 23 18 19 16   Height:      Weight:    75.6 kg (166 lb 10.7 oz)  SpO2:    99%   Exam: Elderly debilitated male, awake, tired, no distress, on HD No jvd Chest occ rhonchi, no wheezing or rales RRR no MRG Abd soft, obese, NTND, no ascites R AKA Mild UE edema, no LLE edema. Marked darkish discoloration of LLE, no ulcer or gangrene Neuro is alert, gen sig weakness, nonfocal R AVF +bruit  HD: MWF [facility] 3hr 30 mins 75.5kgs 2k/2.25Ca 400/1.5 2000 heparin Calcitriol 0.51mcg Aranesp 60 Venofer 50        Assessment: 1. PNA- per primary. Vanc and cefepime. Afebrile. Sputum culture pending 2. ESRD - MWF, HD today on holiday schedule 3. Hypertension/volume - hypotension, up 2kg pre HD today 4. Anemia - hgb 11.1/ cont aranesp- last dose 11/18. Weekly Fe, tsat 22 5. Metabolic bone disease - Ca+ 8.4. Calcitriol. Phoslo. Last phos 4. PTH 168 6. Nutrition - renal diet. multivit good appetite.  7. DM- per primary.  8. Chronic afib- coreg and coumadin per pharm 9. CAD hx CABG / ICD  Plan- HD today, AB, supportive care.  Was DNR last admit in September.      Kelly Splinter MD  pager [phone]    cell [phone]  10/27/2014, 12:15 PM     Recent Labs Lab 10/26/14 0443 10/27/14 0712  NA 139 137  K 4.1 3.5*  CL 98 94*  CO2 21 25  GLUCOSE 158* 50*  BUN 59* 35*  CREATININE 4.99* 3.43*  CALCIUM 8.4 8.7  PHOS  --  4.3    Recent Labs Lab 10/27/14 0712  ALBUMIN 2.5*    Recent Labs Lab 10/26/14 0443 10/27/14 0712  WBC 9.1 9.9  HGB 11.1* 11.5*  HCT 37.3* 38.6*  MCV 106.0* 105.8*  PLT 189 252   . antiseptic oral rinse  7 mL Mouth Rinse BID  . aspirin EC  81 mg Oral Daily  . calcium acetate  1,334 mg Oral TID  WC  . carvedilol  3.125 mg Oral BID WC  . ceFEPime (MAXIPIME) IV  2 g Intravenous Q M,W,F-1800  . famotidine  20 mg Oral Daily  . finasteride  5 mg Oral QPM  . gabapentin  300 mg Oral Daily  . [START ON 10/28/2014] heparin  2,000 Units Dialysis Once in dialysis  . insulin aspart  0-9 Units Subcutaneous TID WC  . insulin glargine  20 Units Subcutaneous QHS  . levothyroxine  75 mcg Oral QAC breakfast  . midodrine  10 mg Oral TID WC  . multivitamin  1 tablet Oral QHS  . pantoprazole  40 mg Oral Daily  . vancomycin  750 mg Intravenous Q M,W,F-HD  . Warfarin - Pharmacist Dosing Inpatient   Does not apply q1800     sodium chloride, sodium chloride, acetaminophen **OR** acetaminophen, alteplase, bisacodyl, feeding supplement (NEPRO CARB STEADY), heparin, ipratropium-albuterol, lidocaine (PF), lidocaine-prilocaine, ondansetron **OR** ondansetron (ZOFRAN) IV, pentafluoroprop-tetrafluoroeth

## 2014-10-27 NOTE — Procedures (Signed)
I was present at this dialysis session, have reviewed the session itself and made  appropriate changes  Kelly Splinter MD (pgr) [phone]    [phone] 10/27/2014, 12:19 PM

## 2014-10-27 NOTE — Progress Notes (Signed)
Pt's blood glucose checked and it is 99. Calling report to [address].  

## 2014-10-27 NOTE — Progress Notes (Signed)
Pt just got back from dialysis. Pt was taken to dialysis before 7am before day shift nurse started. Pt's sugar checked after getting back from dialysis and it is 61. It was reported pt had nothing to eat all morning during dialysis. Pt is drowsy. Pt has orders to move him to 6 east but nurse will get pt's sugar within normal limits prior to moving. Pt's vitals within normal limits. Pt is drowsy possible d/t blood glucose being low and tired from dialysis. Will monitor.  

## 2014-10-27 NOTE — Progress Notes (Signed)
Pt started yelling out he was choking. Nurse ran to room. Pt was able to talk and was not coughing. Pt stated he felt like something is lodged in his throat. Nurse looked in pt's mouth with a light and seen nothing. Nurse sat pt up to side of bed. Nurse gave pt something to drink and patted pt on back. Pt still stating help me help me I'm choking. Nurse checked pt's vital signs and everything within normal limits. Nurse listened to pt's lung sounds and no changes from prior assessment diminished in upper lobes and some crackles right lower lobe heard. Called and notified Dr.[name] who gave orders to hold pills for now and hold transfer to [facility] until pt feels better and to update her.

## 2014-10-28 LAB — GLUCOSE, CAPILLARY
GLUCOSE-CAPILLARY: 65 mg/dL — AB (ref 70–99)
GLUCOSE-CAPILLARY: 101 mg/dL — AB (ref 70–99)
Glucose-Capillary: 87 mg/dL (ref 70–99)
Glucose-Capillary: 92 mg/dL (ref 70–99)
Glucose-Capillary: 46 mg/dL — ABNORMAL LOW (ref 70–99)
Glucose-Capillary: 81 mg/dL (ref 70–99)
Glucose-Capillary: 82 mg/dL (ref 70–99)

## 2014-10-28 LAB — PROTIME-INR
INR: 3.42 — ABNORMAL HIGH (ref 0.00–1.49)
PROTHROMBIN TIME: 34.8 s — AB (ref 11.6–15.2)

## 2014-10-28 MED ORDER — INSULIN GLARGINE 100 UNIT/ML ~~LOC~~ SOLN
15.0000 [IU] | Freq: Every day | SUBCUTANEOUS | Status: DC
  Administered 2014-10-29: 15 [IU] via SUBCUTANEOUS
  Filled 2014-10-28: qty 0.15

## 2014-10-28 MED ORDER — DEXTROSE 50 % IV SOLN
INTRAVENOUS | Status: AC
  Filled 2014-10-28: qty 50

## 2014-10-28 MED ORDER — HEPARIN SODIUM (PORCINE) 1000 UNIT/ML DIALYSIS: 1000.0000 [IU] | INTRAMUSCULAR | Status: DC | PRN

## 2014-10-28 MED ORDER — PENTAFLUOROPROP-TETRAFLUOROETH EX AERO
1.0000 | INHALATION_SPRAY | CUTANEOUS | Status: DC | PRN
Start: 2014-10-28 — End: 2014-10-28

## 2014-10-28 MED ORDER — ALTEPLASE 2 MG IJ SOLR
2.0000 mg | Freq: Once | INTRAMUSCULAR | Status: AC | PRN
  Filled 2014-10-28: qty 2

## 2014-10-28 MED ORDER — LIDOCAINE-PRILOCAINE 2.5-2.5 % EX CREA: 1.0000 "application " | TOPICAL_CREAM | CUTANEOUS | Status: DC | PRN

## 2014-10-28 MED ORDER — SODIUM CHLORIDE 0.9 % IV SOLN: 100.0000 mL | INTRAVENOUS | Status: DC | PRN

## 2014-10-28 MED ORDER — HEPARIN SODIUM (PORCINE) 1000 UNIT/ML DIALYSIS: 2000.0000 [IU] | Freq: Once | INTRAMUSCULAR | Status: DC

## 2014-10-28 MED ORDER — LEVOFLOXACIN 500 MG PO TABS
500.0000 mg | ORAL_TABLET | Freq: Every day | ORAL | Status: DC
  Administered 2014-10-28: 500 mg via ORAL
  Filled 2014-10-28 (×2): qty 1

## 2014-10-28 MED ORDER — LIDOCAINE HCL (PF) 1 % IJ SOLN: 5.0000 mL | INTRAMUSCULAR | Status: DC | PRN

## 2014-10-28 MED ORDER — NEPRO/CARBSTEADY PO LIQD: 237.0000 mL | ORAL | Status: DC | PRN

## 2014-10-28 NOTE — Progress Notes (Signed)
Inpatient Diabetes Program Recommendations  AACE/ADA: New Consensus Statement on Inpatient Glycemic Control (2013)  Target Ranges:  Prepandial:   less than 140 mg/dL      Peak postprandial:   less than 180 mg/dL (1-2 hours)      Critically ill patients:  140 - 180 mg/dL   Reason for Assessment:Results for VERMON, GRAYS (MRN [id number]) as of 10/28/2014 11:13  Ref. Range 10/27/2014 11:21 10/27/2014 12:10 10/27/2014 17:03 10/27/2014 19:54 10/28/2014 07:31 10/28/2014 08:16  Glucose-Capillary Latest Range: 70-99 mg/dL 61 (L) 99 135 (H) 118 (H) 65 (L) 87   Diabetes history: Type 2 diabetes/ ESRD  Outpatient Diabetes medications: Lantus 32 units q HS, Regular insulin sliding scale Current orders for Inpatient glycemic control:  Lantus 20 units q HS, Novolog sensitve tid with meals  Note that fasting glucose was 65 mg /dL this morning.  Consider further reduction of Lantus to 15 units q HS.   Thanks, Adah Perl, RN, BC-ADM Inpatient Diabetes Coordinator Pager [phone]

## 2014-10-28 NOTE — Progress Notes (Signed)
[facility] Rounding Note: Subjective:   Somnolent after receiving Ambien last night, but no complaints knows he's in the hospital and "wants out", then falls back asleep  Objective: Vital signs in last 24 hours: Temp:  [97.5 F (36.4 C)-98.7 F (37.1 C)] 98.1 F (36.7 C) (11/23 0512) Pulse Rate:  [80-94] 88 (11/23 0512) Resp:  [16-24] 16 (11/23 0512) BP: (108-131)/(53-70) 121/60 mmHg (11/23 0512) SpO2:  [98 %-100 %] 100 % (11/23 0512) Weight:  [75.6 kg (166 lb 10.7 oz)] 75.6 kg (166 lb 10.7 oz) (11/22 2130) Weight change: -2.8 kg (-6 lb 2.8 oz)  Intake/Output from previous day: 11/22 0701 - 11/23 0700 In: 240 [P.O.:240] Out: 1900  Intake/Output this shift:   EXAM: General appearance: Somnolent, but able to arouse, in no apparent distress Extremely frail Resp:  CTA without rales, rhonchi, or wheezes + exp wheeze to my exam Cardio:  RRR without murmur or rub 1/6 murmur USB GI:  + BS, soft and nontender Extremities:  R AKA, no edema Access:  AVF @ RUA with + bruit  Lab Results:  Recent Labs  10/26/14 0443 10/27/14 0712  WBC 9.1 9.9  HGB 11.1* 11.5*  HCT 37.3* 38.6*  PLT 189 252   BMET:  Recent Labs  10/26/14 0443 10/27/14 0712  NA 139 137  K 4.1 3.5*  CL 98 94*  CO2 21 25  GLUCOSE 158* 50*  BUN 59* 35*  CREATININE 4.99* 3.43*  CALCIUM 8.4 8.7  ALBUMIN  --  2.5*   Medications: . antiseptic oral rinse  7 mL Mouth Rinse BID  . aspirin EC  81 mg Oral Daily  . calcium acetate  1,334 mg Oral TID WC  . carvedilol  3.125 mg Oral BID WC  . dextrose      . famotidine  20 mg Oral Daily  . finasteride  5 mg Oral QPM  . gabapentin  300 mg Oral Daily  . heparin  2,000 Units Dialysis Once in dialysis  . insulin aspart  0-9 Units Subcutaneous TID WC  . insulin glargine  15 Units Subcutaneous QHS  . levofloxacin  500 mg Oral Daily  . levothyroxine  75 mcg Oral QAC breakfast  . midodrine  10 mg Oral TID WC  . multivitamin  1 tablet Oral QHS  .  pantoprazole  40 mg Oral Daily  . Warfarin - Pharmacist Dosing Inpatient   Does not apply q1800   HD: MWF [facility] 3hr 30 mins 75.5kgs 2k/2.25Ca 400/1.5 2000 heparin Calcitriol 0.64mcg Aranesp 60 Venofer 50   Assessment/Plan: 1. PNA - R basilar consolidation per chest x-ray 11/21; Vancomycin & Cefepime per primary, sputum culture pending.now on levaquin 2. ESRD - HD on MWF @ [facility], K 3.5.  Next HD tomorrow (MWF=Sun-Tu-Fri on holiday schedule). 3. HTN/Volume - BP 121/60, on Midodrine 10 mg tid, Carvedilol 3.125 mg bid; wt 75.6 kg s/p net UF 1.9 L yesterday. 4. Anemia - Hgb 11.5, aranesp on hold. 5. Sec HPT - Ca 8.7 (9.9 corrected), P 4.3; Phoslo 2 with meals  6. Nutrition - Alb 2.5, carb-mod renal diet, vitamin. 7. DM - insulin per primary. 8. Hx A-fib - Carvedilol & Coumadin. 9. CAD - s/p CABG & ICD.   LOS: 3 days   LYLES,CHARLES 10/28/2014,7:36 AM   I have seen and examined this patient and agree with plan and assessment as outlined in the above note of C Lyles with highlighted additions. I note patient was DNR during last hosp admission but I  can't tell from the notes who made the decision/who it was discussed with. Next HD will be tomorrow on holiday schedule.  Midodrine for BP support. Now on levaquin for his PNA.  Would probably avoid use of ambien in this pt - he remains excessively sleepy since dose last PM  Obie Silos B,MD 10/28/2014 12:59 PM

## 2014-10-28 NOTE — Progress Notes (Signed)
ANTICOAGULATION CONSULT NOTE - Follow Up Consult  Pharmacy Consult for Warfarin Indication: atrial fibrillation  Allergies  Allergen Reactions  . Morphine And Related Other (See Comments)    "makes me hallucinate"  . Cox-2 Inhibitors Other (See Comments)    UNKNOWN  . Nsaids Other (See Comments)    "don't really know"  . Penicillins Rash    AMOXICILLIN SAME REACTION    Patient Measurements: Height: 5\' 7"  (170.2 cm) Weight: 166 lb 10.7 oz (75.6 kg) IBW/kg (Calculated) : 66.1  Vital Signs: Temp: 97.4 F (36.3 C) (11/23 0817) Temp Source: Oral (11/23 0817) BP: 111/50 mmHg (11/23 0817) Pulse Rate: 92 (11/23 0817)  Labs:  Recent Labs  10/26/14 0443 10/27/14 0346 10/27/14 0712 10/28/14 0510  HGB 11.1*  --  11.5*  --   HCT 37.3*  --  38.6*  --   PLT 189  --  252  --   LABPROT 33.4* 34.1*  --  34.8*  INR 3.25* 3.34*  --  3.42*  CREATININE 4.99*  --  3.43*  --     Estimated Creatinine Clearance: 16.6 mL/min (by C-G formula based on Cr of 3.43).  Assessment: 80yo male presented with fever to [facility] ED from SNF in [place]. INR was drawn at [facility] 11/20 and was therapeutic at 2.9. Patient's home dose per SNF MAR is 3mg  daily except 3.5mg  on Tuesdays and Thursdays. INR today up to 3.42 even though dose held last night. Pt is also starting levaquin today which may potentiate effects of coumadin. No bleeding noted. CBC stable.  Goal of Therapy:  INR 2-3 Monitor platelets by anticoagulation protocol: Yes   Plan:  1. No coumadin tonight 2. Daily PT/INR  Sherlon Handing, PharmD, BCPS Clinical pharmacist, pager [phone] 10/28/2014,1:35 PM

## 2014-10-28 NOTE — Progress Notes (Signed)
[facility] PROGRESS NOTE  KULE GASCOIGNE IHK:[id number] DOB: 27-Apr-1934 DOA: 10/25/2014 PCP: Annamarie Major, MD  Assessment/Plan: 1. HCAP -s/p 3 days of IV VAnc and Cefepime, change to PO levaquin today -repeat CXR with RLL consolidation -sputum Cx pending -Blood cx results from [facility] ER-NGTD -Swallow eval completed  2. ESRD on HD -s/p HD 11/21 and 11/22 per holiday schedule -Renal following  3. CAD, s/p CABG/ICD -stable, continue ASA/coreg  4. S/P AKA  5. DM -with hypoglycemia again this am -cut down lantus further, SSI  6. P.AFib  -warfarin per pharmacy, continue coreg  Code Status: Full code DVT Prophylaxis: on warfarin Family Communication: d/w extended family at bedside 11/21 Disposition Plan: SNF tomorrow    Antibiotics:  Vanc/cefepime  HPI/Subjective: Hypoglycemic and weak this am, no other complaints  Objective: Filed Vitals:   10/28/14 0817  BP: 111/50  Pulse: 92  Temp: 97.4 F (36.3 C)  Resp: 16    Intake/Output Summary (Last 24 hours) at 10/28/14 1119 Last data filed at 10/28/14 1100  Gross per 24 hour  Intake    360 ml  Output      0 ml  Net    360 ml   Filed Weights   10/27/14 0659 10/27/14 1100 10/27/14 2130  Weight: 77.4 kg (170 lb 10.2 oz) 75.6 kg (166 lb 10.7 oz) 75.6 kg (166 lb 10.7 oz)    Exam:   General:  Sleepy, arousible, oriented to self and place, chronically ill appearing  Cardiovascular: S1S2/RRR  Respiratory: crackles at R base  Abdomen: soft, NT, BS present  Musculoskeletal: no edema c/c , R AKA  Data Reviewed: Basic Metabolic Panel:  Recent Labs Lab 10/26/14 0443 10/27/14 0712  NA 139 137  K 4.1 3.5*  CL 98 94*  CO2 21 25  GLUCOSE 158* 50*  BUN 59* 35*  CREATININE 4.99* 3.43*  CALCIUM 8.4 8.7  MG 2.0  --   PHOS  --  4.3   Liver Function Tests:  Recent Labs Lab 10/27/14 0712  ALBUMIN 2.5*   No results for input(s): LIPASE, AMYLASE in the last 168 hours. No results for  input(s): AMMONIA in the last 168 hours. CBC:  Recent Labs Lab 10/26/14 0443 10/27/14 0712  WBC 9.1 9.9  HGB 11.1* 11.5*  HCT 37.3* 38.6*  MCV 106.0* 105.8*  PLT 189 252   Cardiac Enzymes: No results for input(s): CKTOTAL, CKMB, CKMBINDEX, TROPONINI in the last 168 hours. BNP (last 3 results) No results for input(s): PROBNP in the last 8760 hours. CBG:  Recent Labs Lab 10/27/14 1703 10/27/14 1954 10/28/14 0731 10/28/14 0816 10/28/14 1116  GLUCAP 135* 118* 65* 87 92    Recent Results (from the past 240 hour(s))  MRSA PCR Screening     Status: None   Collection Time: 10/25/14  2:26 PM  Result Value Ref Range Status   MRSA by PCR NEGATIVE NEGATIVE Final    Comment:        The GeneXpert MRSA Assay (FDA approved for NASAL specimens only), is one component of a comprehensive MRSA colonization surveillance program. It is not intended to diagnose MRSA infection nor to guide or monitor treatment for MRSA infections.      Studies: No results found.  Scheduled Meds: . antiseptic oral rinse  7 mL Mouth Rinse BID  . aspirin EC  81 mg Oral Daily  . calcium acetate  1,334 mg Oral TID WC  . carvedilol  3.125 mg Oral BID WC  . dextrose      .  famotidine  20 mg Oral Daily  . finasteride  5 mg Oral QPM  . gabapentin  300 mg Oral Daily  . heparin  2,000 Units Dialysis Once in dialysis  . insulin aspart  0-9 Units Subcutaneous TID WC  . insulin glargine  15 Units Subcutaneous QHS  . levofloxacin  500 mg Oral Daily  . levothyroxine  75 mcg Oral QAC breakfast  . midodrine  10 mg Oral TID WC  . multivitamin  1 tablet Oral QHS  . pantoprazole  40 mg Oral Daily  . Warfarin - Pharmacist Dosing Inpatient   Does not apply q1800   Continuous Infusions:  Antibiotics Given (last 72 hours)    Date/Time Action Medication Dose Rate   10/25/14 1826 Given   vancomycin (VANCOCIN) 1,750 mg in sodium chloride 0.9 % 500 mL IVPB 1,750 mg 250 mL/hr   10/26/14 1522 Given   vancomycin  (VANCOCIN) IVPB 750 mg/150 ml premix 750 mg 150 mL/hr   10/26/14 1630 Given   ceFEPIme (MAXIPIME) 2 g in dextrose 5 % 50 mL IVPB 2 g 100 mL/hr   10/27/14 1722 Given   ceFEPIme (MAXIPIME) 2 g in dextrose 5 % 50 mL IVPB 2 g 100 mL/hr   10/27/14 1801 Given   vancomycin (VANCOCIN) IVPB 750 mg/150 ml premix 750 mg 150 mL/hr      Active Problems:   ESRD on hemodialysis   DM2 (diabetes mellitus, type 2)   S/P AKA (above knee amputation)   S/P CABG (coronary artery bypass graft)   HCAP (healthcare-associated pneumonia)   Fever    Time spent: 83min    Efosa Treichler  [facility] Pager [phone]. If 7PM-7AM, please contact night-coverage at [email or web address], password [facility] 10/28/2014, 11:19 AM  LOS: 3 days

## 2014-10-28 NOTE — Care Management Note (Signed)
CARE MANAGEMENT NOTE 10/28/2014  Patient:  [name] [name], [name] [name]   Account Number:  [id number]  Date Initiated:  10/28/2014  Documentation initiated by:  Phillippe Orlick  Subjective/Objective Assessment:   CM following for progression and d/c planning. Current plan if for SNF.     Action/Plan:   Will follow for any changes in d/c plan.   Anticipated DC Date:  10/29/2014   Anticipated DC Plan:  SKILLED NURSING FACILITY         Choice offered to / List presented to:             Status of service:  In process, will continue to follow Medicare Important Message given?  YES (If response is "NO", the following Medicare IM given date fields will be blank) Date Medicare IM given:  10/28/2014 Medicare IM given by:  Analynn Daum Date Additional Medicare IM given:   Additional Medicare IM given by:    Discharge Disposition:  [place]  Per UR Regulation:    If discussed at Long Length of Stay Meetings, dates discussed:    Comments:

## 2014-10-28 NOTE — Progress Notes (Signed)
Hypoglycemic Event  CBG: 63  Treatment: 15 GM carbohydrate snack  Symptoms: None  Follow-up CBG: Time:0805 CBG Result:87  Possible Reasons for Event: Unknown  Comments/MD notified: Dr. Richard Miu, Linton Rump  Remember to initiate Hypoglycemia Order Set & complete

## 2014-10-28 NOTE — Progress Notes (Signed)
Hypoglycemic Event  CBG: 46  Treatment: 15 GM carbohydrate snack  Symptoms: None  Follow-up CBG: Time: 1810 CBG Result: 81  Possible Reasons for Event: Inadequate meal intake  Comments/MD notified:Dr Carlynn Purl, Kyelle Urbas N  Remember to initiate Hypoglycemia Order Set & complete

## 2014-10-29 ENCOUNTER — Inpatient Hospital Stay (HOSPITAL_COMMUNITY): Payer: PRIVATE HEALTH INSURANCE

## 2014-10-29 LAB — CBC
HCT: 40.2 % (ref 39.0–52.0)
Hemoglobin: 11.9 g/dL — ABNORMAL LOW (ref 13.0–17.0)
MCH: 32.2 pg (ref 26.0–34.0)
MCHC: 29.6 g/dL — AB (ref 30.0–36.0)
MCV: 108.6 fL — ABNORMAL HIGH (ref 78.0–100.0)
PLATELETS: 236 10*3/uL (ref 150–400)
RBC: 3.7 MIL/uL — ABNORMAL LOW (ref 4.22–5.81)
RDW: 16.6 % — ABNORMAL HIGH (ref 11.5–15.5)
WBC: 7 10*3/uL (ref 4.0–10.5)

## 2014-10-29 LAB — RENAL FUNCTION PANEL
Albumin: 2.5 g/dL — ABNORMAL LOW (ref 3.5–5.2)
Anion gap: 15 (ref 5–15)
BUN: 30 mg/dL — AB (ref 6–23)
CHLORIDE: 99 meq/L (ref 96–112)
CO2: 25 mEq/L (ref 19–32)
CREATININE: 3.91 mg/dL — AB (ref 0.50–1.35)
Calcium: 8.9 mg/dL (ref 8.4–10.5)
GFR calc Af Amer: 16 mL/min — ABNORMAL LOW (ref >90)
GFR calc non Af Amer: 13 mL/min — ABNORMAL LOW (ref >90)
Glucose, Bld: 64 mg/dL — ABNORMAL LOW (ref 70–99)
Phosphorus: 3.5 mg/dL (ref 2.3–4.6)
Potassium: 3.7 mEq/L (ref 3.7–5.3)
Sodium: 139 mEq/L (ref 137–147)

## 2014-10-29 LAB — GLUCOSE, CAPILLARY
GLUCOSE-CAPILLARY: 68 mg/dL — AB (ref 70–99)
GLUCOSE-CAPILLARY: 70 mg/dL (ref 70–99)
Glucose-Capillary: 120 mg/dL — ABNORMAL HIGH (ref 70–99)
Glucose-Capillary: 82 mg/dL (ref 70–99)
Glucose-Capillary: 98 mg/dL (ref 70–99)

## 2014-10-29 LAB — PROTIME-INR
INR: 3.91 — ABNORMAL HIGH (ref 0.00–1.49)
Prothrombin Time: 38.6 seconds — ABNORMAL HIGH (ref 11.6–15.2)

## 2014-10-29 MED ORDER — INSULIN GLARGINE 100 UNIT/ML ~~LOC~~ SOLN
10.0000 [IU] | Freq: Every day | SUBCUTANEOUS | Status: DC
  Filled 2014-10-29: qty 0.1

## 2014-10-29 MED ORDER — LEVOFLOXACIN 500 MG PO TABS: 500.0000 mg | ORAL_TABLET | ORAL | Status: AC

## 2014-10-29 MED ORDER — LEVOFLOXACIN 500 MG PO TABS: 500.0000 mg | ORAL_TABLET | ORAL | Status: DC

## 2014-10-29 MED ORDER — MIDODRINE HCL 10 MG PO TABS: 10.0000 mg | ORAL_TABLET | Freq: Three times a day (TID) | ORAL | Status: AC

## 2014-10-29 MED ORDER — MIDODRINE HCL 5 MG PO TABS
ORAL_TABLET | ORAL | Status: AC
  Administered 2014-10-29: 10 mg via ORAL
  Filled 2014-10-29: qty 2

## 2014-10-29 MED ORDER — INSULIN GLARGINE 100 UNIT/ML ~~LOC~~ SOLN: 10.0000 [IU] | Freq: Every day | SUBCUTANEOUS | Status: AC

## 2014-10-29 NOTE — Progress Notes (Signed)
Hypoglycemic Event  CBG: 68  Treatment: 15 GM carbohydrate snack  Symptoms: None  Follow-up CBG: Time: 1630 CBG Result 70   Possible Reasons for Event: Unknown  Comments/MD notified:MD aware     [name] [name] A  Remember to initiate Hypoglycemia Order Set & complete

## 2014-10-29 NOTE — Progress Notes (Addendum)
[facility] Rounding Note: Subjective:    Seen in dialysis and is still quite somnolent - almost as much as yesterday and not at his baseline  Objective: Vital signs in last 24 hours: Temp:  [97.4 F (36.3 C)-98.3 F (36.8 C)] 97.9 F (36.6 C) (11/24 0659) Pulse Rate:  [74-99] 85 (11/24 0712) Resp:  [16-19] 19 (11/24 0712) BP: (109-141)/(50-76) 121/68 mmHg (11/24 0712) SpO2:  [98 %-100 %] 100 % (11/24 0659) Weight:  [75.5 kg (166 lb 7.2 oz)-76.4 kg (168 lb 6.9 oz)] 75.5 kg (166 lb 7.2 oz) (11/24 0659) Weight change: 0.8 kg (1 lb 12.2 oz)  Intake/Output from previous day: 11/23 0701 - 11/24 0700 In: 50 [P.O.:50] Out: -  Intake/Output this shift:   EXAM: Weight 75.8 kg (EDW 75.5) General appearance: Frail WM, somnolent, arousable but falls back asleep very quickly O2 sats 100% on nasal cannula Resp:  Not great air movement, using abd muscles but not really in distress Cardio:  Irregular (looks like AF with PVC's on telemetry) GI:  + BS, soft and nontender Extremities:  R AKA, no edema Indwelling foley with leg bag Access:  AVF @ RUA cannulated and running at 400   Lab Results: Still pending from today pre-dialysis  Recent Labs  10/27/14 0712  WBC 9.9  HGB 11.5*  HCT 38.6*  PLT 252   BMET:   Recent Labs  10/27/14 0712  NA 137  K 3.5*  CL 94*  CO2 25  GLUCOSE 50*  BUN 35*  CREATININE 3.43*  CALCIUM 8.7  ALBUMIN 2.5*   Medications: . antiseptic oral rinse  7 mL Mouth Rinse BID  . aspirin EC  81 mg Oral Daily  . calcium acetate  1,334 mg Oral TID WC  . carvedilol  3.125 mg Oral BID WC  . famotidine  20 mg Oral Daily  . finasteride  5 mg Oral QPM  . gabapentin  300 mg Oral Daily  . insulin aspart  0-9 Units Subcutaneous TID WC  . insulin glargine  15 Units Subcutaneous QHS  . levofloxacin  500 mg Oral Daily  . levothyroxine  75 mcg Oral QAC breakfast  . midodrine  10 mg Oral TID WC  . multivitamin  1 tablet Oral QHS  . pantoprazole  40  mg Oral Daily  . Warfarin - Pharmacist Dosing Inpatient   Does not apply q1800   HD: MWF [facility] 3hr 30 mins 75.5kgs 2k/2.25Ca 400/1.5 2000 heparin Calcitriol 0.28mcg Aranesp 60 Venofer 50   Assessment/Plan: 1. PNA - R basilar consolidation per chest x-ray 11/21; Vancomycin & Cefepime switched to po levaquin. Not a lot of air movement today. Followup CXR after HD 2. ESRD - HD on MWF @ [facility], K 3.5.  HD now on holiday schedule  (MWF=Sun-Tu-Fri on holiday schedule).Pt just over EDW. Small effusions on 11/21 CXR so EDW will prob be lower. Goal 2 liters today 3. HTN/Volume - Midodrine 10 mg tid for BP support 4. Anemia - last Hgb 11.5, aranesp on hold. Hb pending 5. Sec HPT - Ca 8.7 (9.9 corrected), P 4.3; Phoslo 2 with meals  6. Nutrition - Alb 2.5, carb-mod renal diet, vitamin. 7. DM - insulin per primary. 8. A-fib - Carvedilol & Coumadin. Rate controlled 9. CAD - s/p CABG & ICD. 10. Somnolence - seems excessive at this point.  Not hypoxemic based on O2 sats. Consider ABG to r/o hypercarbia. 11. Code status - I note was DNR last admission.  Has this changed?  LOS: 4 days  Jamal Maes, MD [facility] [phone] Pager 10/29/2014, 7:35 AM

## 2014-10-29 NOTE — Clinical Social Work Psychosocial (Signed)
Clinical Social Work Department BRIEF PSYCHOSOCIAL ASSESSMENT 10/29/2014  Patient:  [name] [name], [name] [name]     Account Number:  [id number]     Admit date:  10/25/2014  Clinical Social Worker:  Gwenevere Abbot,  Date/Time:  10/29/2014 11:18 AM  Referred by:  Physician  Date Referred:  10/29/2014 Referred for  SNF Placement   Other Referral:   Interview type:  Family Other interview type:    PSYCHOSOCIAL DATA Living Status:  FACILITY Admitted from facility:  [facility] Level of care:  [place] Primary support name:  [name] [name]. Primary support relationship to patient:  CHILD, ADULT Degree of support available:   [name] [name].  [phone]    CURRENT CONCERNS Current Concerns  Post-Acute Placement   Other Concerns:    SOCIAL WORK ASSESSMENT / PLAN CSW-Intern spoke with patient's son [name] [name]. to confirm that his father will be returning to [facility] at discharge. Patient's son stated that  his father will be returning back to the facility.   Assessment/plan status:  No Further Intervention Required Other assessment/ plan:   Information/referral to community resources:   None needed or requested at this time.    PATIENT'S/FAMILY'S RESPONSE TO PLAN OF CARE: [name] [name]. was pleasant and responsive to discharge planning. CSW-Intern was unable to speak with the patient due to he is only oriented to self.    Gwenevere Abbot  CSW-Intern

## 2014-10-29 NOTE — Procedures (Signed)
I have personally attended this patient's dialysis session.   Just 0.3 over EDW but effusions on 11/21 CXR Goal 2L - adjust as BP tolerates Currently SBP 120's 2K bath pending labs Aranesp on hold pending Hb (last 11.5) AVF 400  Jamal Maes, MD [facility] [phone] Pager 10/29/2014, 7:39 AM

## 2014-10-29 NOTE — Progress Notes (Signed)
ANTICOAGULATION CONSULT NOTE - Follow Up Consult  Pharmacy Consult for coumadin Indication: atrial fibrillation  Allergies  Allergen Reactions  . Morphine And Related Other (See Comments)    "makes me hallucinate"  . Cox-2 Inhibitors Other (See Comments)    UNKNOWN  . Nsaids Other (See Comments)    "don't really know"  . Penicillins Rash    AMOXICILLIN SAME REACTION    Patient Measurements: Height: 5\' 7"  (170.2 cm) Weight: 166 lb 7.2 oz (75.5 kg) IBW/kg (Calculated) : 66.1 Heparin Dosing Weight:   Vital Signs: Temp: 97.9 F (36.6 C) (11/24 0659) Temp Source: Oral (11/24 0659) BP: 121/54 mmHg (11/24 1000) Pulse Rate: 95 (11/24 1000)  Labs:  Recent Labs  10/27/14 0346 10/27/14 0712 10/28/14 0510 10/29/14 0715  HGB  --  11.5*  --  11.9*  HCT  --  38.6*  --  40.2  PLT  --  252  --  236  LABPROT 34.1*  --  34.8* 38.6*  INR 3.34*  --  3.42* 3.91*  CREATININE  --  3.43*  --  3.91*    Estimated Creatinine Clearance: 14.6 mL/min (by C-G formula based on Cr of 3.91).   Medications:  Scheduled:  . antiseptic oral rinse  7 mL Mouth Rinse BID  . aspirin EC  81 mg Oral Daily  . calcium acetate  1,334 mg Oral TID WC  . carvedilol  3.125 mg Oral BID WC  . famotidine  20 mg Oral Daily  . finasteride  5 mg Oral QPM  . gabapentin  300 mg Oral Daily  . insulin aspart  0-9 Units Subcutaneous TID WC  . insulin glargine  10 Units Subcutaneous QHS  . [START ON 10/30/2014] levofloxacin  500 mg Oral Q48H  . levothyroxine  75 mcg Oral QAC breakfast  . midodrine  10 mg Oral TID WC  . multivitamin  1 tablet Oral QHS  . pantoprazole  40 mg Oral Daily  . Warfarin - Pharmacist Dosing Inpatient   Does not apply q1800   Infusions:    Assessment: 80 yo male with afib is currently on supratherapeutic coumadin.  INR today is up to 3.91.  Patient did not have any dose last night.  He is on levaquin which may affect INR.   Goal of Therapy:  INR 2-3 Monitor platelets by  anticoagulation protocol: Yes   Plan:  - no coumadin tonight - INR In am  Kashana Breach, Tsz-Yin 10/29/2014,10:21 AM

## 2014-10-29 NOTE — Plan of Care (Signed)
Problem: Phase I Progression Outcomes Goal: Voiding-avoid urinary catheter unless indicated Outcome: Completed/Met Date Met:  10/29/14

## 2014-10-29 NOTE — Discharge Summary (Signed)
Physician Discharge Summary  ALVERTO SHEDD UDJ:[id number] DOB: Aug 28, 1934 DOA: 10/25/2014  PCP: Annamarie Major, MD  Admit date: 10/25/2014 Discharge date: 10/29/2014  Time spent: 4 minutes  Recommendations for Outpatient Follow-up:  1. PCP in 1 week 2. Adjust lantus dose depending on CBGs at SNF, dose decreased this admission  Discharge Diagnoses:    HCAP (healthcare-associated pneumonia)   Fever   Metabolic encephalopathy   Hypoglycemia   ESRD on hemodialysis   DM2 (diabetes mellitus, type 2)   S/P AKA (above knee amputation)   S/P CABG (coronary artery bypass graft)     Discharge Condition: stable  Diet recommendation: Renal diabetic  Filed Weights   10/28/14 2222 10/29/14 0659 10/29/14 1037  Weight: 76.4 kg (168 lb 6.9 oz) 75.5 kg (166 lb 7.2 oz) 73.5 kg (162 lb 0.6 oz)    History of present illness:  [name] [name] is a 80 y.o. male with PMH of ESRD on HD MWF, CAD/ICM/ICD, s/p CABG, DM on Insulin, P.Afib on warfarin, COPD, R AKA, resident of SNF at [place] was at HD today when he started running a fever of 102 with some lethargy and decreased responsiveness and was transferred to [facility] ER, where his mentation improved, there he was febrile with temp of 102, WBC of 15K and CXR revealed bibasilar opacities concerning for pneumonia. He also had a CT head there that was normal. He feels better now, reports mild cough but denies any congestion or dyspnea. No abd pain, no N/V/Diarrhea  Hospital Course:  1. Health care associated pneumonia -s/p 3 days of IV VAnc and Cefepime, changed to PO levaquin -repeat CXR with RLL consolidation -sputum Cx negative -Blood cx results from [facility] ER-NGTD -Swallow eval completed, tolerating diet -stable and improved  2. Metabolic encephalopathy -due to 1, improved  3. ESRD on HD -s/p HD 11/21 and 11/22 per holiday schedule -Renal following, s/p HD today  4. CAD, s/p CABG/ICD -stable, continue ASA/coreg  5. S/P AKA  6.  DM -with episodes of hypoglycemia this admission, necessitating cutting down lantus dose -now stable, lantus may need to adjusted further depending on CBGs  7. P.AFib  -continue warfarin, coreg  Consultations: Renal  Discharge Exam: Filed Vitals:   10/29/14 1104  BP: 116/63  Pulse: 99  Temp: 98.4 F (36.9 C)  Resp: 19    General: AAOx to self, place Cardiovascular: S1S2/RRR Respiratory: CTAB  Discharge Instructions You were cared for by a hospitalist during your hospital stay. If you have any questions about your discharge medications or the care you received while you were in the hospital after you are discharged, you can call the unit and asked to speak with the hospitalist on call if the hospitalist that took care of you is not available. Once you are discharged, your primary care physician will handle any further medical issues. Please note that NO REFILLS for any discharge medications will be authorized once you are discharged, as it is imperative that you return to your primary care physician (or establish a relationship with a primary care physician if you do not have one) for your aftercare needs so that they can reassess your need for medications and monitor your lab values.  Discharge Instructions    Discharge instructions    Complete by:  As directed   Renal Diabetic diet     Increase activity slowly    Complete by:  As directed           Current Discharge Medication List  START taking these medications   Details  levofloxacin (LEVAQUIN) 500 MG tablet Take 1 tablet (500 mg total) by mouth every other day. For 4days Qty: 3 tablet, Refills: 0      CONTINUE these medications which have CHANGED   Details  insulin glargine (LANTUS) 100 UNIT/ML injection Inject 0.1 mLs (10 Units total) into the skin at bedtime. Qty: 10 mL, Refills: 0    midodrine (PROAMATINE) 10 MG tablet Take 1 tablet (10 mg total) by mouth 3 (three) times daily with meals.      CONTINUE  these medications which have NOT CHANGED   Details  budesonide-formoterol (SYMBICORT) 160-4.5 MCG/ACT inhaler Inhale 2 puffs into the lungs 2 (two) times daily.    calcium acetate (PHOSLO) 667 MG capsule Take 667 mg by mouth 3 (three) times daily with meals.     carvedilol (COREG) 3.125 MG tablet Take 1 tablet (3.125 mg total) by mouth 2 (two) times daily with a meal. Qty: 30 tablet, Refills: 0    Cholecalciferol 50000 UNITS capsule Take 50,000 Units by mouth every 30 (thirty) days.    finasteride (PROSCAR) 5 MG tablet Take 5 mg by mouth every evening.     gabapentin (NEURONTIN) 300 MG capsule Take 300 mg by mouth at bedtime.    insulin regular (NOVOLIN R,HUMULIN R) 100 units/mL injection Inject 2-8 Units into the skin 3 (three) times daily before meals. SSI:  CBG 200-250= 2 units; 251-300= 4 units; 301-350= 6 units; 351-400= 8 units; > 400= call MD    ipratropium-albuterol (DUONEB) 0.5-2.5 (3) MG/3ML SOLN Take 3 mLs by nebulization every 6 (six) hours. Respiratory distress    levothyroxine (SYNTHROID, LEVOTHROID) 75 MCG tablet Take 75 mcg by mouth at bedtime.    loratadine (CLARITIN) 10 MG tablet Take 10 mg by mouth daily.    multivitamin (RENA-VIT) TABS tablet Take 1 tablet by mouth at bedtime.    pantoprazole (PROTONIX) 40 MG tablet Take 40 mg by mouth daily.     warfarin (COUMADIN) 2 MG tablet Take 1 tablet (2 mg total) by mouth daily.      STOP taking these medications     aspirin EC 81 MG EC tablet      bisacodyl (DULCOLAX) 10 MG suppository      ranitidine (ZANTAC) 150 MG tablet        Allergies  Allergen Reactions  . Morphine And Related Other (See Comments)    "makes me hallucinate"  . Cox-2 Inhibitors Other (See Comments)    UNKNOWN  . Nsaids Other (See Comments)    "don't really know"  . Penicillins Rash    AMOXICILLIN SAME REACTION   Follow-up Information    Follow up with SOPALA,JERZY, MD. Schedule an appointment as soon as possible for a visit in 1  week.   Specialty:  Internal Medicine       The results of significant diagnostics from this hospitalization (including imaging, microbiology, ancillary and laboratory) are listed below for reference.    Significant Diagnostic Studies: Dg Chest 2 View  10/26/2014   CLINICAL DATA:  Chest congestion with shortness of breath and weakness. Fever.  EXAM: CHEST  2 VIEW  COMPARISON:  10/25/2014  FINDINGS: Sequelae of prior CABG are again identified. Left-sided dual lead pacemaker/ ICD remains in place. Cardiac silhouette remains enlarged. Thoracic aortic calcification is noted. The lungs are slightly better inflated than on the prior study with decreased pulmonary vascular congestion. There is increased, confluent consolidation in the right lung base. Left basilar  opacity persists but is slightly improved. Small bilateral pleural effusions are questioned. No pneumothorax is identified.  IMPRESSION: 1. Increased right basilar consolidation, suspicious for pneumonia. 2. Slightly decreased pulmonary vascular congestion.   Electronically Signed   By: Logan Bores   On: 10/26/2014 08:53    Microbiology: Recent Results (from the past 240 hour(s))  MRSA PCR Screening     Status: None   Collection Time: 10/25/14  2:26 PM  Result Value Ref Range Status   MRSA by PCR NEGATIVE NEGATIVE Final    Comment:        The GeneXpert MRSA Assay (FDA approved for NASAL specimens only), is one component of a comprehensive MRSA colonization surveillance program. It is not intended to diagnose MRSA infection nor to guide or monitor treatment for MRSA infections.      Labs: Basic Metabolic Panel:  Recent Labs Lab 10/26/14 0443 10/27/14 0712 10/29/14 0715  NA 139 137 139  K 4.1 3.5* 3.7  CL 98 94* 99  CO2 21 25 25   GLUCOSE 158* 50* 64*  BUN 59* 35* 30*  CREATININE 4.99* 3.43* 3.91*  CALCIUM 8.4 8.7 8.9  MG 2.0  --   --   PHOS  --  4.3 3.5   Liver Function Tests:  Recent Labs Lab 10/27/14 0712  10/29/14 0715  ALBUMIN 2.5* 2.5*   No results for input(s): LIPASE, AMYLASE in the last 168 hours. No results for input(s): AMMONIA in the last 168 hours. CBC:  Recent Labs Lab 10/26/14 0443 10/27/14 0712 10/29/14 0715  WBC 9.1 9.9 7.0  HGB 11.1* 11.5* 11.9*  HCT 37.3* 38.6* 40.2  MCV 106.0* 105.8* 108.6*  PLT 189 252 236   Cardiac Enzymes: No results for input(s): CKTOTAL, CKMB, CKMBINDEX, TROPONINI in the last 168 hours. BNP: BNP (last 3 results) No results for input(s): PROBNP in the last 8760 hours. CBG:  Recent Labs Lab 10/28/14 1819 10/28/14 1944 10/28/14 2219 10/29/14 0056 10/29/14 1213  GLUCAP 81 82 101* 120* 98       Signed:  Akela Pocius  [facility] 10/29/2014, 1:16 PM

## 2014-10-29 NOTE — Progress Notes (Signed)
Pt. Discharged to [facility]. Report called all questions answers. Pt. Hemodynamically stable.    Penni Bombard, RN 10/29/2014 5:13 PM

## 2014-10-29 NOTE — Plan of Care (Signed)
Problem: Phase I Progression Outcomes Goal: Voiding-avoid urinary catheter unless indicated Outcome: Not Applicable Date Met:  00/86/76

## 2014-11-05 DEATH — deceased

## 2014-11-14 ENCOUNTER — Encounter (HOSPITAL_COMMUNITY): Payer: Self-pay | Admitting: Vascular Surgery

## 2015-03-24 IMAGING — CR DG CHEST 2V
2 series · 2 of 2 positions shown · non-contrast
Comparison: October 26, 2014.

CLINICAL DATA: Pneumonia, pleural effusion.

EXAM:
CHEST  2 VIEW

[chest lat]
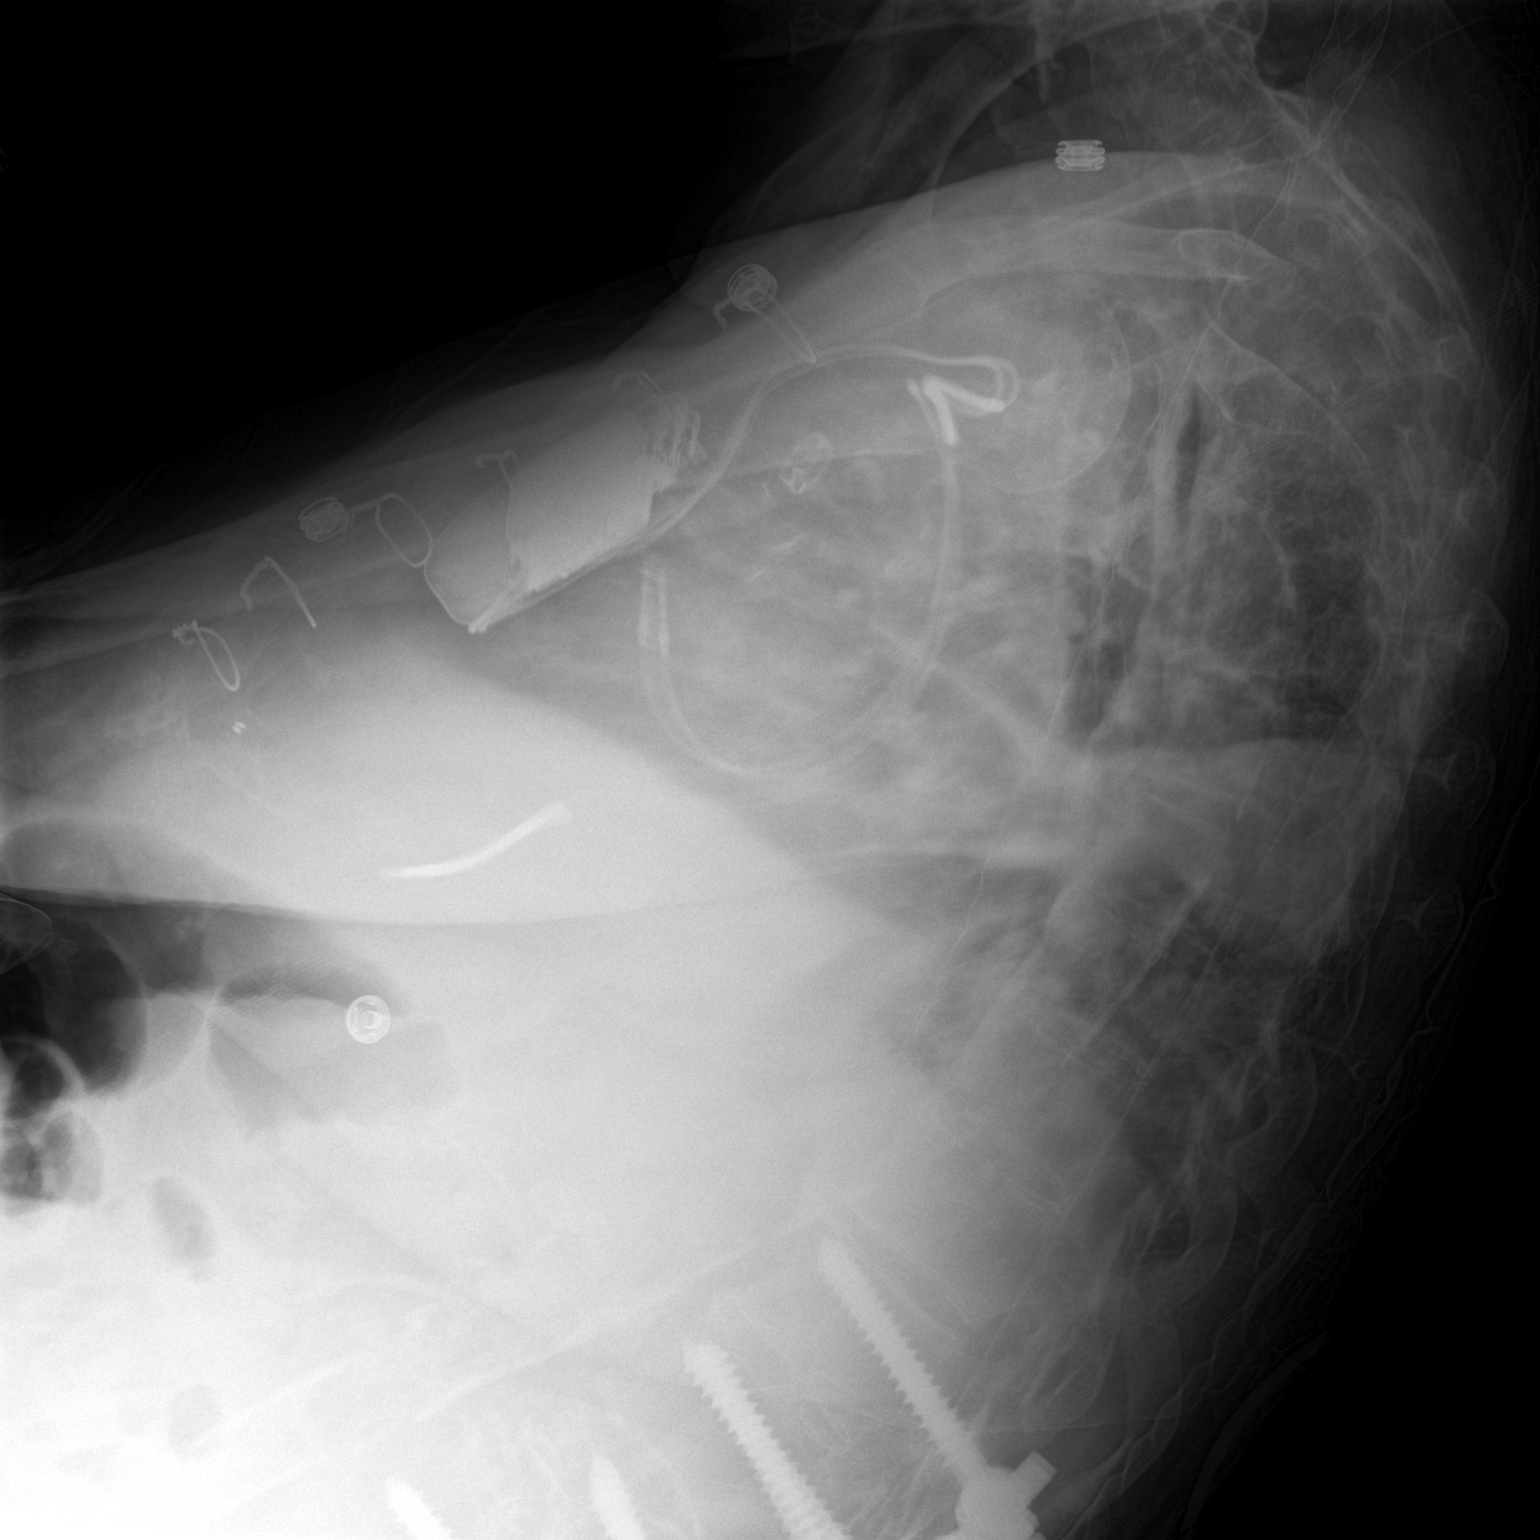

[chest ap]
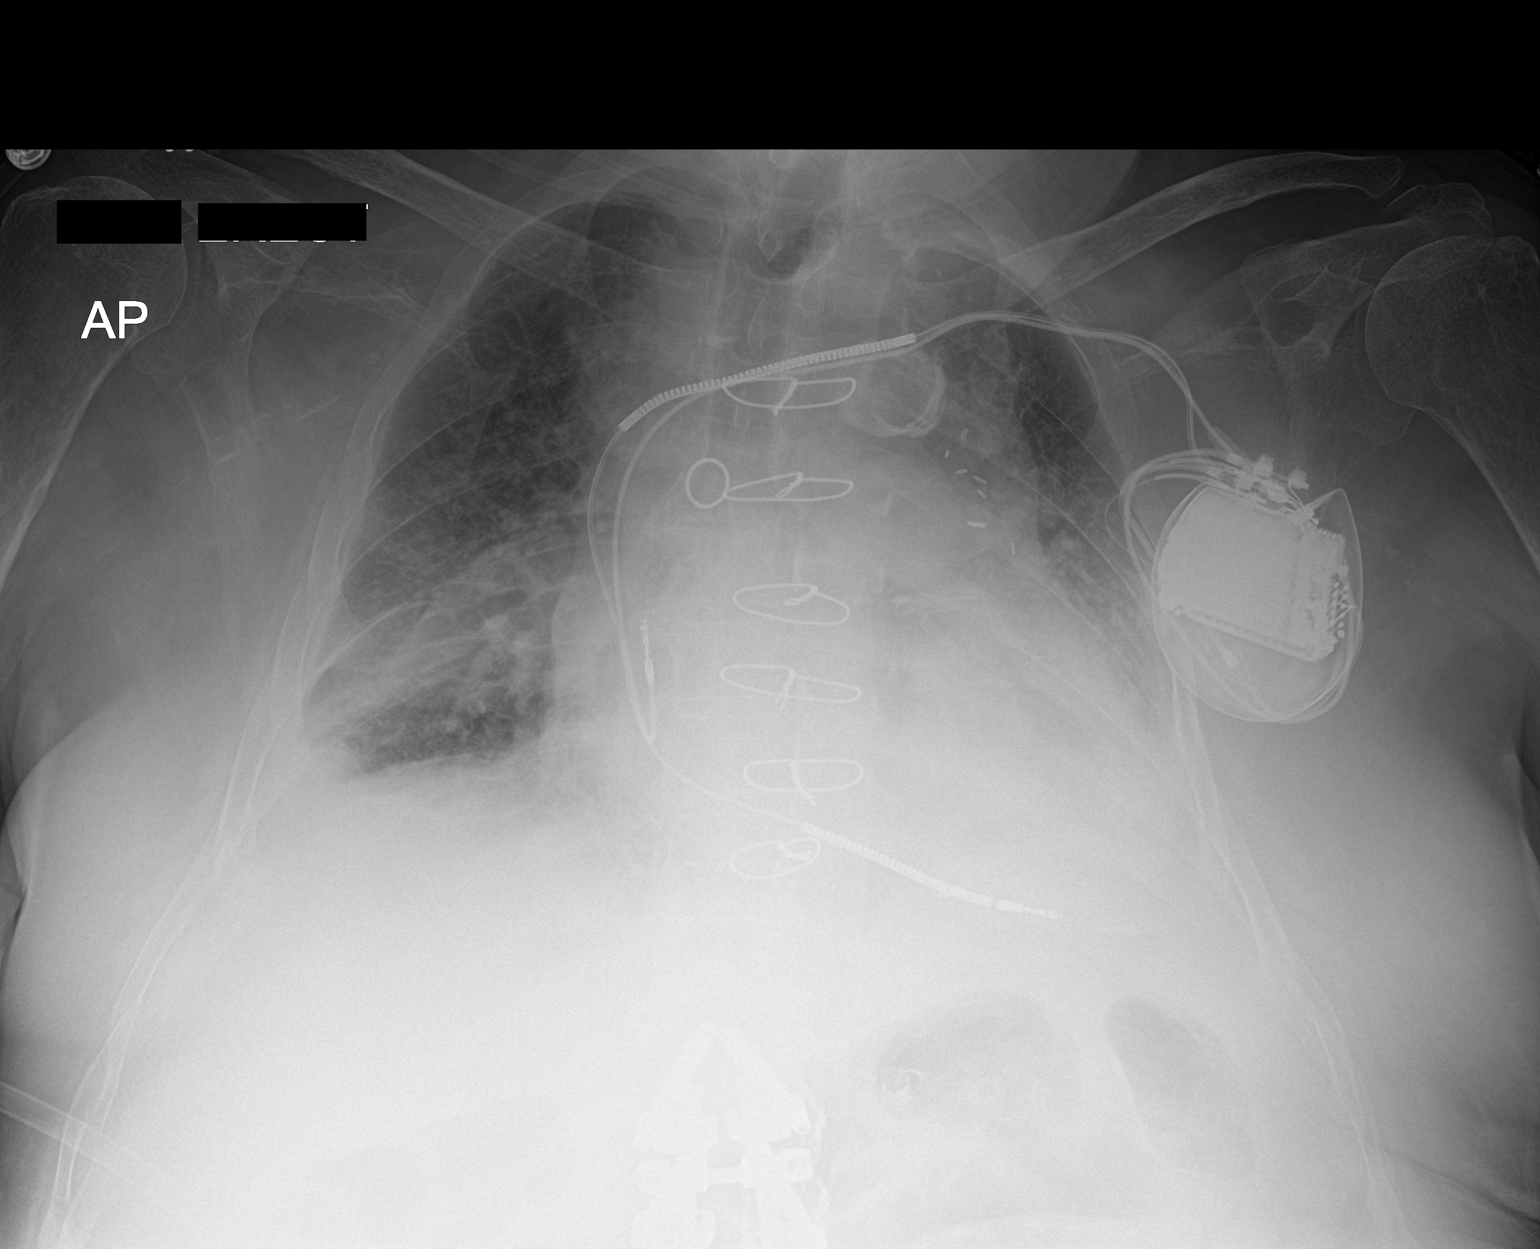

[2 of 2 positions shown; findings below may reference images not displayed]

FINDINGS: Stable cardiomegaly. Status post coronary artery bypass graft.
Left-sided pacemaker is unchanged in position. Stable mild central
pulmonary vascular congestion is noted. Stable bilateral basilar
opacities are noted concerning for pneumonia or atelectasis with
associated mild pleural effusions. Bony thorax appears intact.
IMPRESSION: Stable bilateral basilar opacities are noted concerning for
pneumonia or atelectasis with associated mild pleural effusions.
